# Patient Record
Sex: Male | Born: 1975 | Race: White | Hispanic: No | Marital: Married | Smoking: Current every day smoker
Health system: Southern US, Community
[De-identification: ages and names within clinical notes are randomized; demographics above are authoritative.]

## PROBLEM LIST (undated history)

## (undated) DIAGNOSIS — I10 Essential (primary) hypertension: Secondary | ICD-10-CM

## (undated) DIAGNOSIS — M1990 Unspecified osteoarthritis, unspecified site: Secondary | ICD-10-CM

## (undated) DIAGNOSIS — E7800 Pure hypercholesterolemia, unspecified: Secondary | ICD-10-CM

## (undated) DIAGNOSIS — M179 Osteoarthritis of knee, unspecified: Secondary | ICD-10-CM

## (undated) DIAGNOSIS — I2089 Other forms of angina pectoris: Secondary | ICD-10-CM

## (undated) DIAGNOSIS — E785 Hyperlipidemia, unspecified: Secondary | ICD-10-CM

## (undated) DIAGNOSIS — E875 Hyperkalemia: Secondary | ICD-10-CM

## (undated) DIAGNOSIS — R0600 Dyspnea, unspecified: Secondary | ICD-10-CM

## (undated) DIAGNOSIS — I208 Other forms of angina pectoris: Secondary | ICD-10-CM

## (undated) DIAGNOSIS — G43909 Migraine, unspecified, not intractable, without status migrainosus: Secondary | ICD-10-CM

## (undated) DIAGNOSIS — I219 Acute myocardial infarction, unspecified: Secondary | ICD-10-CM

## (undated) DIAGNOSIS — K219 Gastro-esophageal reflux disease without esophagitis: Secondary | ICD-10-CM

## (undated) HISTORY — PX: APPENDECTOMY: SHX54

## (undated) HISTORY — PX: KNEE SURGERY: SHX244

## (undated) HISTORY — PX: CHOLECYSTECTOMY: SHX55

## (undated) HISTORY — PX: JOINT REPLACEMENT: SHX530

## (undated) HISTORY — PX: COLONOSCOPY: SHX174

## (undated) HISTORY — PX: ESOPHAGOGASTRODUODENOSCOPY: SHX1529

## (undated) HISTORY — PX: CARDIAC CATHETERIZATION: SHX172

---

## 2005-09-15 ENCOUNTER — Emergency Department: Payer: Self-pay | Admitting: General Practice

## 2005-12-26 ENCOUNTER — Emergency Department: Payer: Self-pay | Admitting: Emergency Medicine

## 2005-12-28 ENCOUNTER — Ambulatory Visit: Payer: Self-pay | Admitting: Internal Medicine

## 2006-08-28 ENCOUNTER — Emergency Department: Payer: Self-pay | Admitting: Emergency Medicine

## 2006-12-28 ENCOUNTER — Emergency Department: Payer: Self-pay | Admitting: General Practice

## 2007-04-13 ENCOUNTER — Emergency Department: Payer: Self-pay | Admitting: Emergency Medicine

## 2007-05-14 ENCOUNTER — Emergency Department: Payer: Self-pay | Admitting: Emergency Medicine

## 2007-05-15 ENCOUNTER — Emergency Department: Payer: Self-pay | Admitting: Emergency Medicine

## 2007-05-15 ENCOUNTER — Ambulatory Visit: Payer: Self-pay | Admitting: Emergency Medicine

## 2007-05-18 ENCOUNTER — Ambulatory Visit: Payer: Self-pay | Admitting: Gastroenterology

## 2007-05-22 ENCOUNTER — Ambulatory Visit: Payer: Self-pay | Admitting: General Surgery

## 2008-08-26 ENCOUNTER — Emergency Department: Payer: Self-pay | Admitting: Emergency Medicine

## 2008-12-13 ENCOUNTER — Emergency Department: Payer: Self-pay | Admitting: Emergency Medicine

## 2008-12-20 ENCOUNTER — Emergency Department: Payer: Self-pay | Admitting: Emergency Medicine

## 2009-01-05 ENCOUNTER — Emergency Department: Payer: Self-pay | Admitting: Emergency Medicine

## 2009-02-11 ENCOUNTER — Ambulatory Visit: Payer: Self-pay | Admitting: Orthopedic Surgery

## 2009-03-04 ENCOUNTER — Ambulatory Visit: Payer: Self-pay | Admitting: Pain Medicine

## 2009-03-17 ENCOUNTER — Ambulatory Visit: Payer: Self-pay | Admitting: Pain Medicine

## 2009-04-05 ENCOUNTER — Emergency Department: Payer: Self-pay | Admitting: Emergency Medicine

## 2009-06-01 ENCOUNTER — Emergency Department: Payer: Self-pay | Admitting: Unknown Physician Specialty

## 2010-05-14 ENCOUNTER — Emergency Department: Payer: Self-pay | Admitting: Emergency Medicine

## 2010-05-26 ENCOUNTER — Ambulatory Visit: Payer: Self-pay | Admitting: Internal Medicine

## 2010-06-04 ENCOUNTER — Emergency Department: Payer: Self-pay | Admitting: Emergency Medicine

## 2010-08-10 DEATH — deceased

## 2010-11-11 ENCOUNTER — Emergency Department: Payer: Self-pay | Admitting: Emergency Medicine

## 2011-03-12 ENCOUNTER — Emergency Department: Payer: Self-pay | Admitting: Emergency Medicine

## 2011-05-06 ENCOUNTER — Emergency Department: Payer: Self-pay | Admitting: Internal Medicine

## 2011-11-27 ENCOUNTER — Emergency Department: Payer: Self-pay | Admitting: Emergency Medicine

## 2011-11-27 LAB — COMPREHENSIVE METABOLIC PANEL
Albumin: 4 g/dL (ref 3.4–5.0)
Alkaline Phosphatase: 72 U/L (ref 50–136)
Anion Gap: 8 (ref 7–16)
Bilirubin,Total: 0.4 mg/dL (ref 0.2–1.0)
Calcium, Total: 9.2 mg/dL (ref 8.5–10.1)
Co2: 27 mmol/L (ref 21–32)
Creatinine: 1.25 mg/dL (ref 0.60–1.30)
EGFR (African American): >60
EGFR (Non-African Amer.): >60
Glucose: 115 mg/dL — ABNORMAL HIGH (ref 65–99)
Osmolality: 283 (ref 275–301)
Potassium: 4 mmol/L (ref 3.5–5.1)
Sodium: 140 mmol/L (ref 136–145)

## 2011-11-27 LAB — CBC
HCT: 48.6 % (ref 40.0–52.0)
MCHC: 33.7 g/dL (ref 32.0–36.0)
MCV: 89 fL (ref 80–100)
RDW: 13.6 % (ref 11.5–14.5)
WBC: 13.1 10*3/uL — ABNORMAL HIGH (ref 3.8–10.6)

## 2011-11-27 LAB — TROPONIN I
Troponin-I: <0.02 ng/mL
Troponin-I: <0.02 ng/mL

## 2013-07-05 ENCOUNTER — Ambulatory Visit: Payer: Self-pay | Admitting: Emergency Medicine

## 2014-11-11 ENCOUNTER — Emergency Department: Payer: Self-pay | Admitting: Emergency Medicine

## 2014-11-11 LAB — BASIC METABOLIC PANEL
ANION GAP: 6 — AB (ref 7–16)
BUN: 15 mg/dL (ref 7–18)
Calcium, Total: 8.7 mg/dL (ref 8.5–10.1)
Chloride: 106 mmol/L (ref 98–107)
Co2: 28 mmol/L (ref 21–32)
Creatinine: 1.31 mg/dL — ABNORMAL HIGH (ref 0.60–1.30)
EGFR (African American): >60
EGFR (Non-African Amer.): >60
Glucose: 102 mg/dL — ABNORMAL HIGH (ref 65–99)
Osmolality: 280 (ref 275–301)
POTASSIUM: 3.7 mmol/L (ref 3.5–5.1)
Sodium: 140 mmol/L (ref 136–145)

## 2014-11-11 LAB — CBC
HCT: 45 % (ref 40.0–52.0)
HGB: 15 g/dL (ref 13.0–18.0)
MCH: 29.1 pg (ref 26.0–34.0)
MCHC: 33.4 g/dL (ref 32.0–36.0)
MCV: 87 fL (ref 80–100)
Platelet: 183 10*3/uL (ref 150–440)
RBC: 5.17 10*6/uL (ref 4.40–5.90)
RDW: 14 % (ref 11.5–14.5)
WBC: 6.9 10*3/uL (ref 3.8–10.6)

## 2014-11-11 LAB — TROPONIN I: Troponin-I: <0.02 ng/mL

## 2014-11-12 LAB — TROPONIN I: Troponin-I: <0.02 ng/mL

## 2014-11-25 ENCOUNTER — Emergency Department: Payer: Self-pay | Admitting: Student

## 2014-12-18 DIAGNOSIS — I38 Endocarditis, valve unspecified: Secondary | ICD-10-CM

## 2014-12-18 HISTORY — DX: Endocarditis, valve unspecified: I38

## 2015-06-06 ENCOUNTER — Emergency Department: Payer: Medicaid Other

## 2015-06-06 ENCOUNTER — Emergency Department
Admission: EM | Admit: 2015-06-06 | Discharge: 2015-06-06 | Disposition: A | Discharge status: Home / Self Care | Payer: Medicaid Other | Attending: Emergency Medicine | Admitting: Emergency Medicine

## 2015-06-06 ENCOUNTER — Encounter: Payer: Self-pay | Admitting: Emergency Medicine

## 2015-06-06 DIAGNOSIS — R109 Unspecified abdominal pain: Secondary | ICD-10-CM | POA: Diagnosis not present

## 2015-06-06 DIAGNOSIS — Z720 Tobacco use: Secondary | ICD-10-CM | POA: Diagnosis not present

## 2015-06-06 DIAGNOSIS — R110 Nausea: Secondary | ICD-10-CM | POA: Insufficient documentation

## 2015-06-06 DIAGNOSIS — R34 Anuria and oliguria: Secondary | ICD-10-CM | POA: Diagnosis present

## 2015-06-06 HISTORY — DX: Pure hypercholesterolemia, unspecified: E78.00

## 2015-06-06 LAB — CBC WITH DIFFERENTIAL/PLATELET
Basophils Absolute: 0.1 10*3/uL (ref 0–0.1)
Basophils Relative: 1 %
EOS ABS: 0.3 10*3/uL (ref 0–0.7)
EOS PCT: 3 %
HCT: 46.7 % (ref 40.0–52.0)
Hemoglobin: 15.9 g/dL (ref 13.0–18.0)
Lymphocytes Relative: 43 %
Lymphs Abs: 3.6 10*3/uL (ref 1.0–3.6)
MCH: 29.6 pg (ref 26.0–34.0)
MCHC: 34.2 g/dL (ref 32.0–36.0)
MCV: 86.7 fL (ref 80.0–100.0)
MONO ABS: 0.5 10*3/uL (ref 0.2–1.0)
MONOS PCT: 6 %
Neutro Abs: 4 10*3/uL (ref 1.4–6.5)
Neutrophils Relative %: 47 %
PLATELETS: 203 10*3/uL (ref 150–440)
RBC: 5.39 MIL/uL (ref 4.40–5.90)
RDW: 14.4 % (ref 11.5–14.5)
WBC: 8.5 10*3/uL (ref 3.8–10.6)

## 2015-06-06 LAB — URINALYSIS COMPLETE WITH MICROSCOPIC (ARMC ONLY)
BILIRUBIN URINE: NEGATIVE
Bacteria, UA: NONE SEEN
Glucose, UA: NEGATIVE mg/dL
HGB URINE DIPSTICK: NEGATIVE
LEUKOCYTES UA: NEGATIVE
Nitrite: NEGATIVE
Protein, ur: NEGATIVE mg/dL
SQUAMOUS EPITHELIAL / LPF: NONE SEEN
Specific Gravity, Urine: 1.033 — ABNORMAL HIGH (ref 1.005–1.030)
pH: 5 (ref 5.0–8.0)

## 2015-06-06 LAB — COMPREHENSIVE METABOLIC PANEL
ALT: 19 U/L (ref 17–63)
ANION GAP: 6 (ref 5–15)
AST: 22 U/L (ref 15–41)
Albumin: 3.9 g/dL (ref 3.5–5.0)
Alkaline Phosphatase: 75 U/L (ref 38–126)
BUN: 13 mg/dL (ref 6–20)
CALCIUM: 8.9 mg/dL (ref 8.9–10.3)
CHLORIDE: 108 mmol/L (ref 101–111)
CO2: 28 mmol/L (ref 22–32)
Creatinine, Ser: 1.31 mg/dL — ABNORMAL HIGH (ref 0.61–1.24)
GFR calc non Af Amer: >60 mL/min (ref >60)
Glucose, Bld: 93 mg/dL (ref 65–99)
Potassium: 4.3 mmol/L (ref 3.5–5.1)
SODIUM: 142 mmol/L (ref 135–145)
Total Bilirubin: 0.5 mg/dL (ref 0.3–1.2)
Total Protein: 7.1 g/dL (ref 6.5–8.1)

## 2015-06-06 MED ORDER — DIAZEPAM 5 MG PO TABS: 5.0000 mg | ORAL_TABLET | Freq: Three times a day (TID) | ORAL | Status: DC | PRN

## 2015-06-06 MED ORDER — KETOROLAC TROMETHAMINE 30 MG/ML IJ SOLN
30.0000 mg | Freq: Once | INTRAMUSCULAR | Status: AC
  Administered 2015-06-06: 30 mg via INTRAVENOUS
  Filled 2015-06-06: qty 1

## 2015-06-06 MED ORDER — IBUPROFEN 800 MG PO TABS: 800.0000 mg | ORAL_TABLET | Freq: Three times a day (TID) | ORAL | Status: DC | PRN

## 2015-06-06 NOTE — ED Provider Notes (Signed)
[facility] Emergency Department Provider Note     Time seen: ----------------------------------------- 7:53 PM on 06/06/2015 -----------------------------------------    I have reviewed the triage vital signs and the nursing notes.   HISTORY  Chief Complaint Flank Pain    HPI Alex Brandt is a 39 y.o. male who presents ER with left flank pain that started suddenly around 3 PM. Patient had decreased urine output since that period time. Has never had a kidney stone before, pain is sharp and he had some nausea. Nothing seems to make it better or worse.   Past Medical History  Diagnosis Date  . High cholesterol     There are no active problems to display for this patient.   Past Surgical History  Procedure Laterality Date  . Cholecystectomy    . Appendectomy      Allergies Review of patient's allergies indicates no known allergies.  Social History Social History  Substance Use Topics  . Smoking status: Current Every Day Smoker -- 0.50 packs/day    Types: Cigarettes  . Smokeless tobacco: None  . Alcohol Use: Yes    Review of Systems Constitutional: Negative for fever. Eyes: Negative for visual changes. ENT: Negative for sore throat. Cardiovascular: Negative for chest pain. Respiratory: Negative for shortness of breath. Gastrointestinal: Positive for left flank pain and nausea Genitourinary: Negative for dysuria. Musculoskeletal: Negative for back pain. Skin: Negative for rash. Neurological: Negative for headaches, focal weakness or numbness.  10-point ROS otherwise negative.  ____________________________________________   PHYSICAL EXAM:  VITAL SIGNS: ED Triage Vitals  Enc Vitals Group     BP 06/06/15 1844 96/54 mmHg     Pulse Rate 06/06/15 1844 96     Resp 06/06/15 1844 16     Temp 06/06/15 1844 98.1 F (36.7 C)     Temp Source 06/06/15 1844 Oral     SpO2 06/06/15 1844 97 %     Weight 06/06/15 1844 220 lb (99.791  kg)     Height 06/06/15 1844  (1.93 m)     Head Cir --      Peak Flow --      Pain Score 06/06/15 1845 8     Pain Loc --      Pain Edu? --      Excl. in GC? --     Constitutional: Alert and oriented. Well appearing and in no distress. Eyes: Conjunctivae are normal. PERRL. Normal extraocular movements. ENT   Head: Normocephalic and atraumatic.   Nose: No congestion/rhinnorhea.   Mouth/Throat: Mucous membranes are moist.   Neck: No stridor. Cardiovascular: Normal rate, regular rhythm. Normal and symmetric distal pulses are present in all extremities. No murmurs, rubs, or gallops. Respiratory: Normal respiratory effort without tachypnea nor retractions. Breath sounds are clear and equal bilaterally. No wheezes/rales/rhonchi. Gastrointestinal: Soft and nontender. No distention. No abdominal bruits. Left flank tenderness Musculoskeletal: Nontender with normal range of motion in all extremities. No joint effusions.  No lower extremity tenderness nor edema. Neurologic:  Normal speech and language. No gross focal neurologic deficits are appreciated. Speech is normal. No gait instability. Skin:  Skin is warm, dry and intact. No rash noted. Psychiatric: Mood and affect are normal. Speech and behavior are normal. Patient exhibits appropriate insight and judgment.  ____________________________________________  ED COURSE:  Pertinent labs & imaging results that were available during my care of the patient were reviewed by me and considered in my medical decision making (see chart for details). Patient is in no  acute distress, likely renal colic. ____________________________________________    LABS (pertinent positives/negatives)  Labs Reviewed  URINALYSIS COMPLETEWITH MICROSCOPIC ([facility] ONLY) - Abnormal; Notable for the following:    Color, Urine YELLOW (*)    APPearance CLEAR (*)    Ketones, ur TRACE (*)    Specific Gravity, Urine 1.033 (*)    All other components within  normal limits  COMPREHENSIVE METABOLIC PANEL - Abnormal; Notable for the following:    Creatinine, Ser 1.31 (*)    All other components within normal limits  CBC WITH DIFFERENTIAL/PLATELET    RADIOLOGY Images were viewed by me  CT renal protocol IMPRESSION: No acute findings. No renal or ureteral stones. No hydronephrosis. ____________________________________________  FINAL ASSESSMENT AND PLAN  Flank pain  Plan: Patient with labs and imaging as dictated above. No clear etiology for flank pain. Likely muscle strain. Patient be discharged with Motrin and Valium to take as needed. He is encouraged to have close follow-up with his doctor.   Emily Filbert, MD   Emily Filbert, MD 06/06/15 2126

## 2015-06-06 NOTE — Discharge Instructions (Signed)
Flank Pain °Flank pain refers to pain that is located on the side of the body between the upper abdomen and the back. The pain may occur over a short period of time (acute) or may be long-term or reoccurring (chronic). It may be mild or severe. Flank pain can be caused by many things. °CAUSES  °Some of the more common causes of flank pain include: °· Muscle strains.   °· Muscle spasms.   °· A disease of your spine (vertebral disk disease).   °· A lung infection (pneumonia).   °· Fluid around your lungs (pulmonary edema).   °· A kidney infection.   °· Kidney stones.   °· A very painful skin rash caused by the chickenpox virus (shingles).   °· Gallbladder disease.   °HOME CARE INSTRUCTIONS  °Home care will depend on the cause of your pain. In general, °· Rest as directed by your caregiver. °· Drink enough fluids to keep your urine clear or pale yellow. °· Only take over-the-counter or prescription medicines as directed by your caregiver. Some medicines may help relieve the pain. °· Tell your caregiver about any changes in your pain. °· Follow up with your caregiver as directed. °SEEK IMMEDIATE MEDICAL CARE IF:  °· Your pain is not controlled with medicine.   °· You have new or worsening symptoms. °· Your pain increases.   °· You have abdominal pain.   °· You have shortness of breath.   °· You have persistent nausea or vomiting.   °· You have swelling in your abdomen.   °· You feel faint or pass out.   °· You have blood in your urine. °· You have a fever or persistent symptoms for more than 2-3 days. °· You have a fever and your symptoms suddenly get worse. °MAKE SURE YOU:  °· Understand these instructions. °· Will watch your condition. °· Will get help right away if you are not doing well or get worse. °Document Released: 11/17/2005 Document Revised: 06/20/2012 Document Reviewed: 05/10/2012 °[employer]® Patient Information ©2015 [employer]. This information is not intended to replace advice given to you by your  health care provider. Make sure you discuss any questions you have with your health care provider. ° °

## 2015-06-06 NOTE — ED Notes (Signed)
Patient presents to the ED with left flank pain that started at 3pm.  Patient states he has had less frequent urination than normal as well.  Patient denies history of kidney stones.  Patient appears uncomfortable in triage.  Moving around in chair.

## 2016-06-11 IMAGING — CR DG CHEST 2V
1 series · 2 of 2 positions shown · non-contrast
Comparison: Prior radiograph from 03/26/2012

CLINICAL DATA: Initial evaluation for acute chest pain.

EXAM:
CHEST  2 VIEW

[Series 1: dxr chest pa (or ap) and lateral · 0.14mm/px · 2 of 2 slices shown]
[im 1/2]
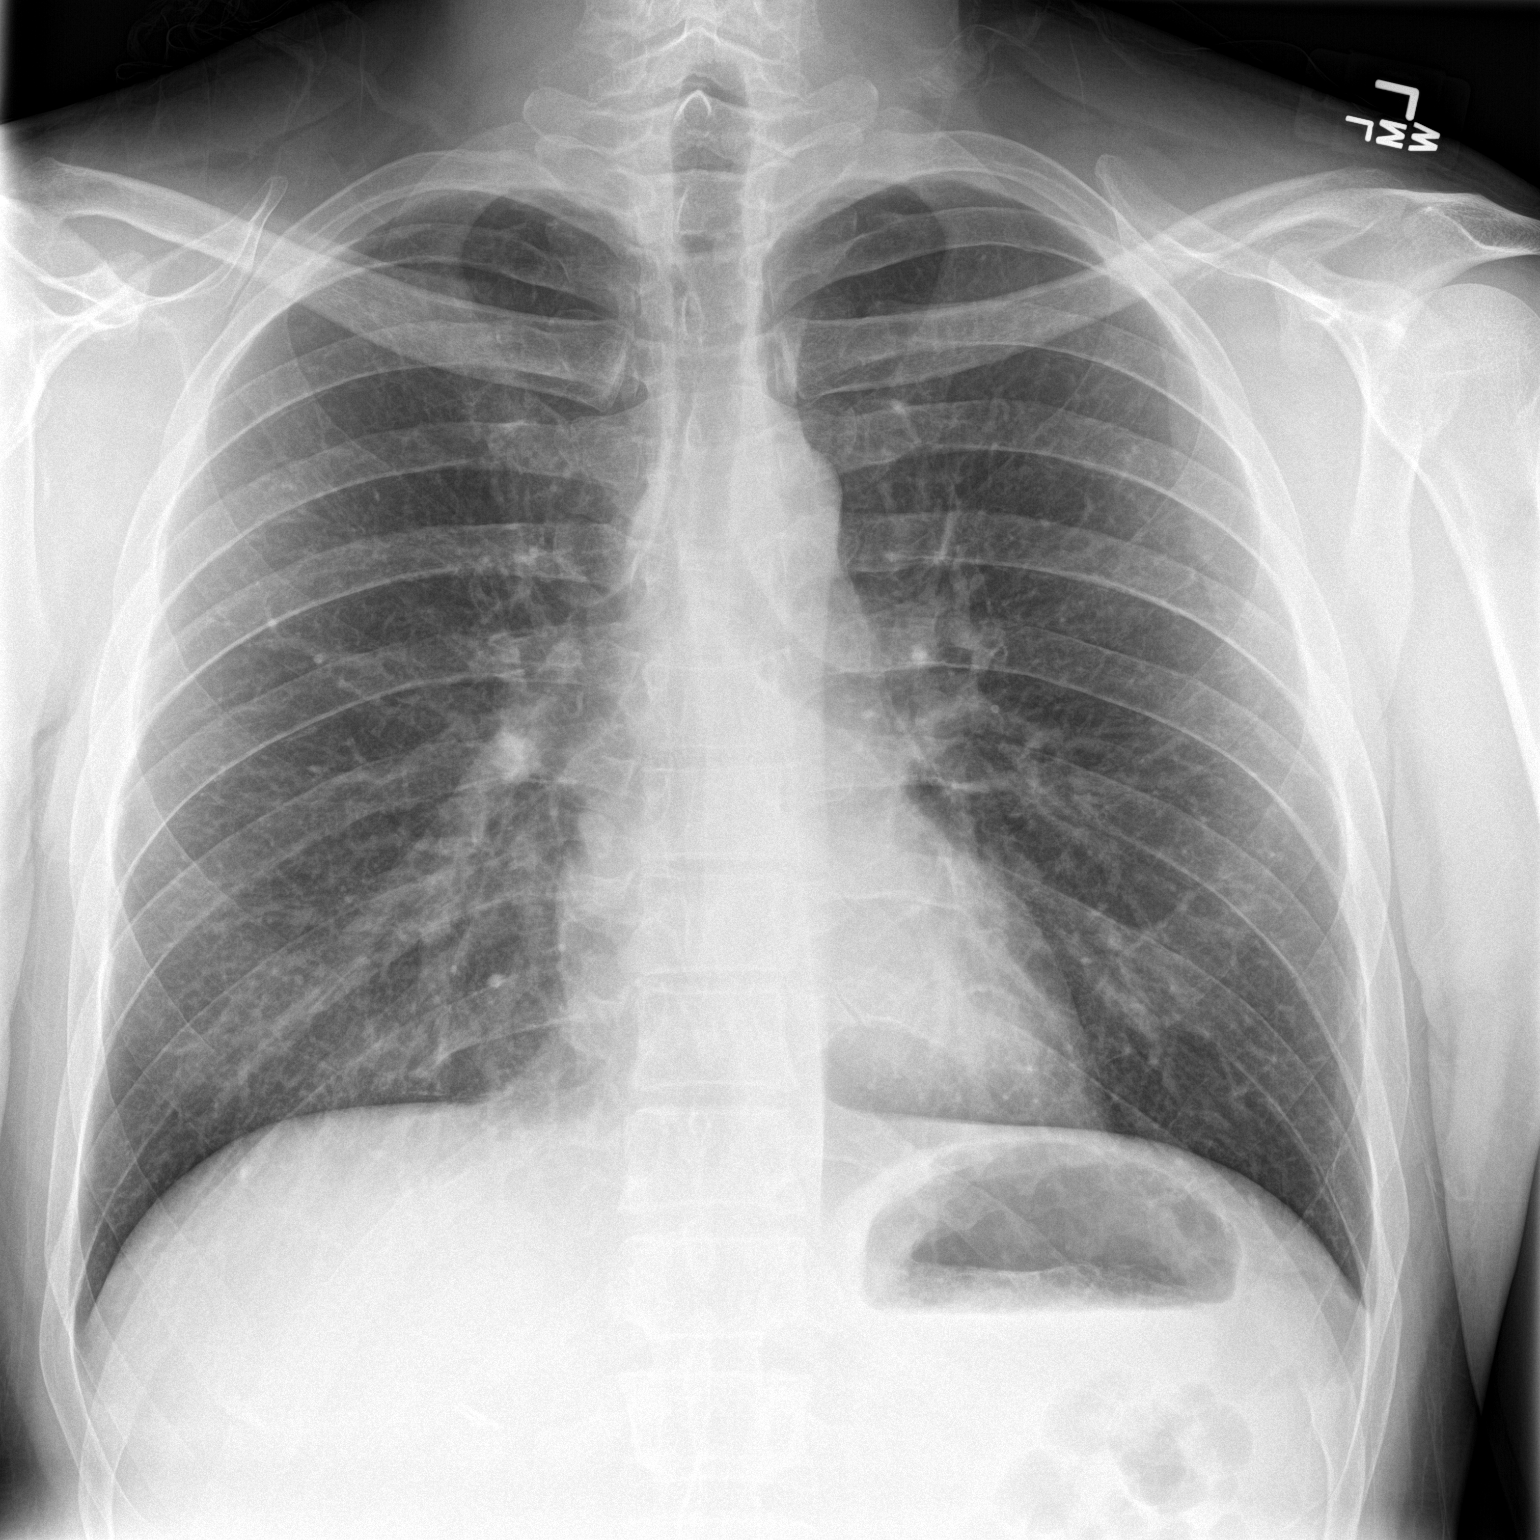
[im 2/2]
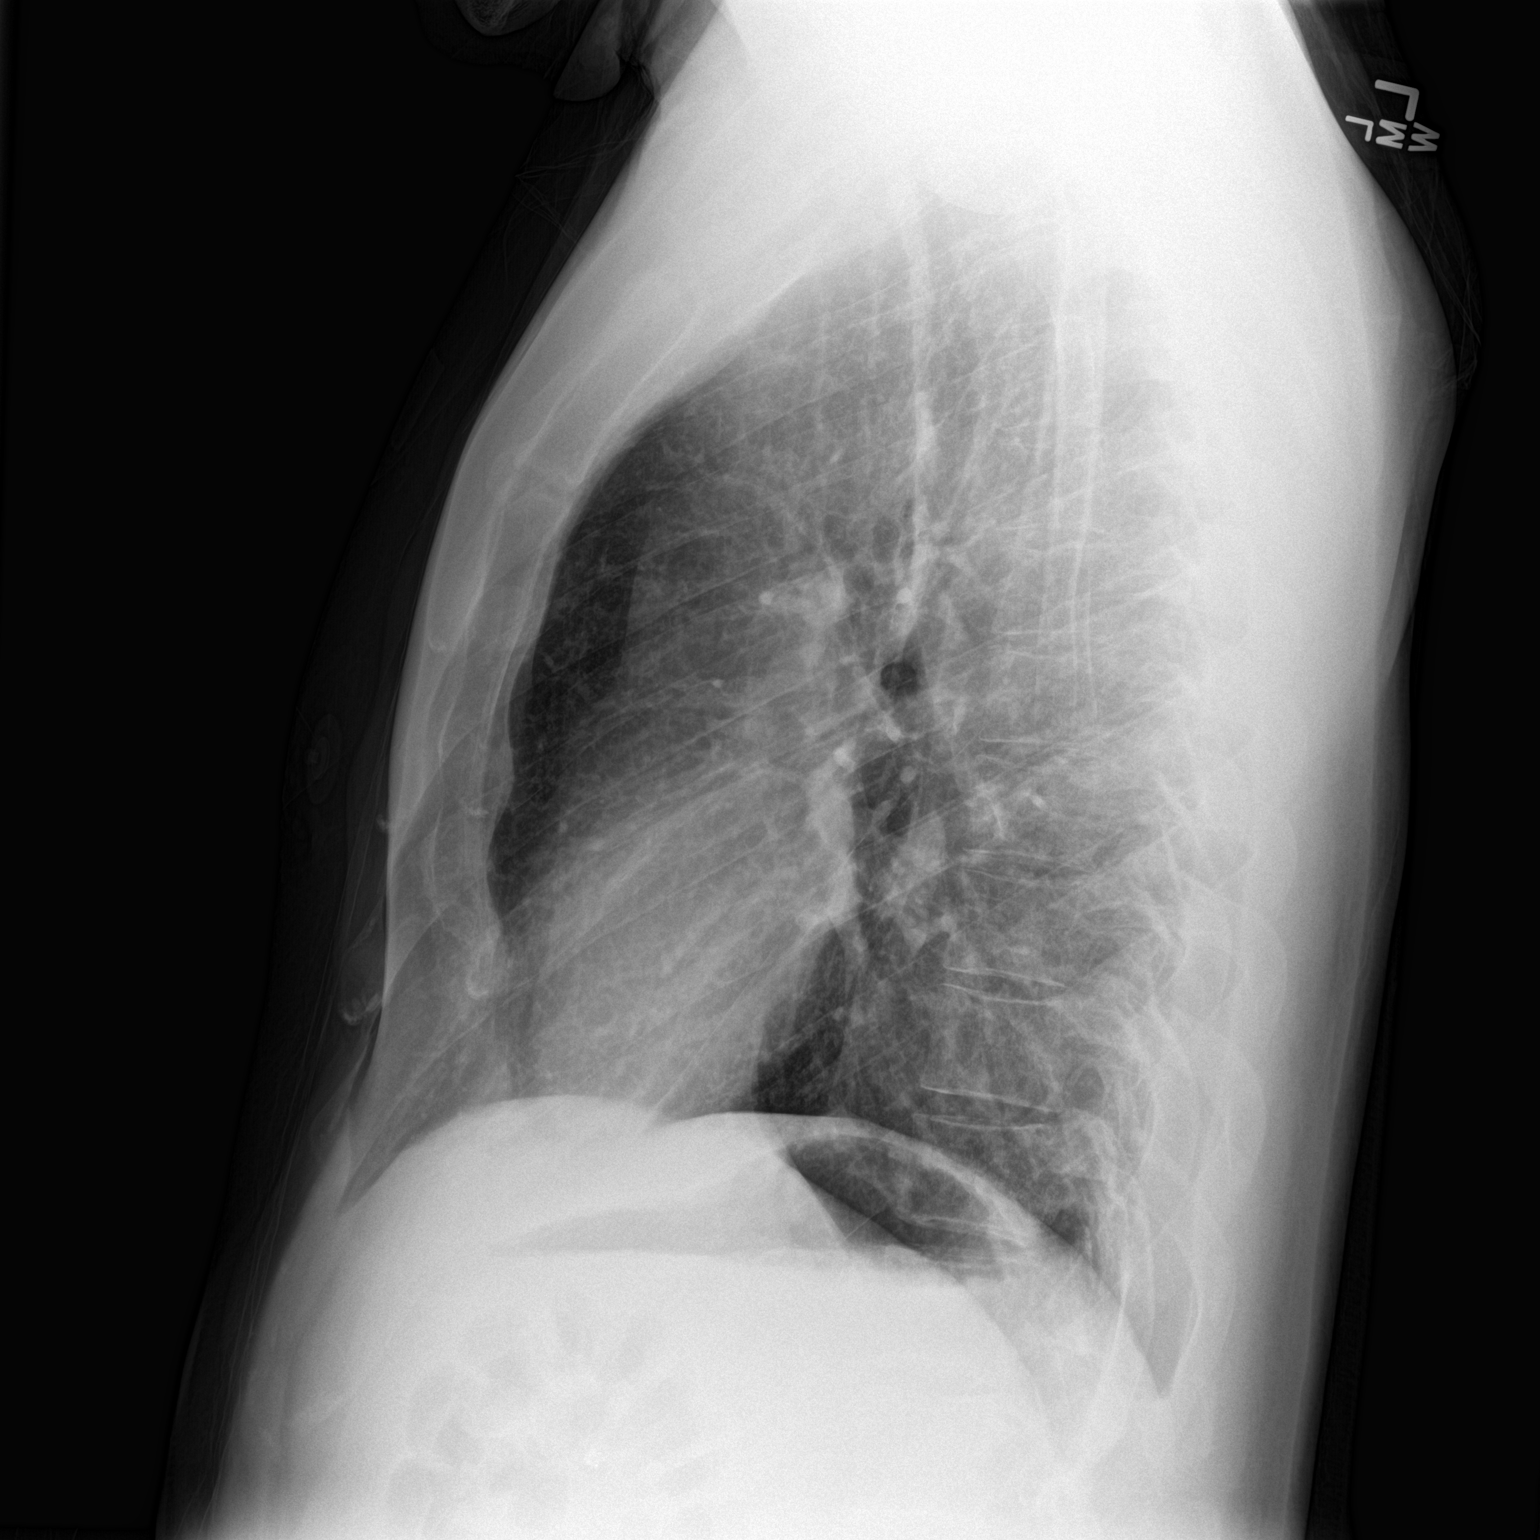

[2 of 2 positions shown; findings below may reference images not displayed]

FINDINGS: The cardiac and mediastinal silhouettes are stable in size and
contour, and remain within normal limits.

The lungs are normally inflated. No airspace consolidation, pleural
effusion, or pulmonary edema is identified. There is no
pneumothorax.

No acute osseous abnormality identified.
IMPRESSION: No active cardiopulmonary disease.

## 2017-04-01 ENCOUNTER — Emergency Department
Admission: EM | Admit: 2017-04-01 | Discharge: 2017-04-01 | Disposition: A | Discharge status: Home / Self Care | Payer: Medicaid Other | Attending: Emergency Medicine | Admitting: Emergency Medicine

## 2017-04-01 ENCOUNTER — Emergency Department: Payer: Medicaid Other

## 2017-04-01 DIAGNOSIS — R079 Chest pain, unspecified: Secondary | ICD-10-CM | POA: Diagnosis not present

## 2017-04-01 DIAGNOSIS — R55 Syncope and collapse: Secondary | ICD-10-CM

## 2017-04-01 DIAGNOSIS — Z79899 Other long term (current) drug therapy: Secondary | ICD-10-CM | POA: Diagnosis not present

## 2017-04-01 DIAGNOSIS — E860 Dehydration: Secondary | ICD-10-CM | POA: Diagnosis not present

## 2017-04-01 DIAGNOSIS — F17210 Nicotine dependence, cigarettes, uncomplicated: Secondary | ICD-10-CM | POA: Diagnosis not present

## 2017-04-01 DIAGNOSIS — I10 Essential (primary) hypertension: Secondary | ICD-10-CM | POA: Insufficient documentation

## 2017-04-01 HISTORY — DX: Essential (primary) hypertension: I10

## 2017-04-01 LAB — CBC WITH DIFFERENTIAL/PLATELET
BASOS ABS: 0.1 10*3/uL (ref 0–0.1)
BASOS PCT: 1 %
EOS ABS: 0.1 10*3/uL (ref 0–0.7)
EOS PCT: 1 %
HCT: 47 % (ref 40.0–52.0)
Hemoglobin: 16.4 g/dL (ref 13.0–18.0)
Lymphocytes Relative: 30 %
Lymphs Abs: 2.5 10*3/uL (ref 1.0–3.6)
MCH: 30.2 pg (ref 26.0–34.0)
MCHC: 35 g/dL (ref 32.0–36.0)
MCV: 86.2 fL (ref 80.0–100.0)
MONO ABS: 0.5 10*3/uL (ref 0.2–1.0)
Monocytes Relative: 6 %
Neutro Abs: 5.2 10*3/uL (ref 1.4–6.5)
Neutrophils Relative %: 62 %
PLATELETS: 205 10*3/uL (ref 150–440)
RBC: 5.45 MIL/uL (ref 4.40–5.90)
RDW: 14.2 % (ref 11.5–14.5)
WBC: 8.4 10*3/uL (ref 3.8–10.6)

## 2017-04-01 LAB — COMPREHENSIVE METABOLIC PANEL
ALBUMIN: 4.5 g/dL (ref 3.5–5.0)
ALT: 17 U/L (ref 17–63)
AST: 22 U/L (ref 15–41)
Alkaline Phosphatase: 81 U/L (ref 38–126)
Anion gap: 8 (ref 5–15)
BUN: 17 mg/dL (ref 6–20)
CALCIUM: 9.1 mg/dL (ref 8.9–10.3)
CO2: 27 mmol/L (ref 22–32)
Chloride: 103 mmol/L (ref 101–111)
Creatinine, Ser: 1.19 mg/dL (ref 0.61–1.24)
GFR calc Af Amer: >60 mL/min (ref >60)
Glucose, Bld: 97 mg/dL (ref 65–99)
POTASSIUM: 3.8 mmol/L (ref 3.5–5.1)
Sodium: 138 mmol/L (ref 135–145)
TOTAL PROTEIN: 7.9 g/dL (ref 6.5–8.1)
Total Bilirubin: 0.7 mg/dL (ref 0.3–1.2)

## 2017-04-01 LAB — URINE DRUG SCREEN, QUALITATIVE (ARMC ONLY)
AMPHETAMINES, UR SCREEN: NOT DETECTED
BENZODIAZEPINE, UR SCRN: NOT DETECTED
Barbiturates, Ur Screen: NOT DETECTED
Cannabinoid 50 Ng, Ur ~~LOC~~: NOT DETECTED
Cocaine Metabolite,Ur ~~LOC~~: NOT DETECTED
MDMA (Ecstasy)Ur Screen: NOT DETECTED
METHADONE SCREEN, URINE: NOT DETECTED
Opiate, Ur Screen: NOT DETECTED
PHENCYCLIDINE (PCP) UR S: NOT DETECTED
TRICYCLIC, UR SCREEN: NOT DETECTED

## 2017-04-01 LAB — TROPONIN I

## 2017-04-01 LAB — LIPASE, BLOOD: LIPASE: 28 U/L (ref 11–51)

## 2017-04-01 LAB — SALICYLATE LEVEL

## 2017-04-01 LAB — CK: CK TOTAL: 118 U/L (ref 49–397)

## 2017-04-01 LAB — ACETAMINOPHEN LEVEL

## 2017-04-01 LAB — ETHANOL: Alcohol, Ethyl (B): 5 mg/dL — ABNORMAL HIGH (ref <5)

## 2017-04-01 MED ORDER — IOPAMIDOL (ISOVUE-370) INJECTION 76%
75.0000 mL | Freq: Once | INTRAVENOUS | Status: AC | PRN
  Administered 2017-04-01: 75 mL via INTRAVENOUS

## 2017-04-01 MED ORDER — SODIUM CHLORIDE 0.9 % IV BOLUS (SEPSIS)
1000.0000 mL | Freq: Once | INTRAVENOUS | Status: AC
  Administered 2017-04-01: 1000 mL via INTRAVENOUS

## 2017-04-01 NOTE — ED Triage Notes (Signed)
Pt comes via [facility] for c/o heat exposure and central chest pain that radiates to left arm and back. Per EMS pt was outside mowing and was found by family in bathroom unconscious. Pt A&OX4, VS stable per EMS with temp of 98.5. EKG unremarkable. BS 100

## 2017-04-01 NOTE — ED Notes (Signed)
Pt answering questions without difficulty at this time.

## 2017-04-01 NOTE — ED Provider Notes (Signed)
[facility] Emergency Department Provider Note  ____________________________________________  Time seen: Approximately 7:08 PM  I have reviewed the triage vital signs and the nursing notes.   HISTORY  Chief Complaint Heat Exposure and Chest Pain    HPI Alex Brandt is a 41 y.o. male comes to the ED due to passing out in his bathroom at home. Family reported to EMS that the patient has been outside mowing in the hot weather and then came inside he is a bathroom where he then lost consciousness. Patient denies any complaints prior to passing out. No chest pain back pain abdominal pain shortness of breath or headache. When he woke up he's been having central chest pain described as tingling, radiates to the back and left arm. Constant since then. No aggravating or alleviating factors. Not short of breath. No vomiting or diaphoresis.  Oddly, the patient initially acts like he can't talk but he communicates normally otherwise including mouthing words and responding to questions appropriately and writing. Toward the end of interview and exam the patient did begin speaking normally.     Past Medical History:  Diagnosis Date  . High cholesterol   . Hypertension      There are no active problems to display for this patient.    Past Surgical History:  Procedure Laterality Date  . APPENDECTOMY    . CHOLECYSTECTOMY       Prior to Admission medications   Medication Sig Start Date End Date Taking? Authorizing Provider  meclizine (ANTIVERT) 25 MG tablet Take 25 mg by mouth 3 (three) times daily as needed for dizziness.   Yes [provider]  diazepam (VALIUM) 5 MG tablet Take 1 tablet (5 mg total) by mouth every 8 (eight) hours as needed for muscle spasms. Patient not taking: Reported on 04/01/2017 06/06/15   Emily Filbert, MD  ibuprofen (ADVIL,MOTRIN) 800 MG tablet Take 1 tablet (800 mg total) by mouth every 8 (eight) hours as  needed. Patient not taking: Reported on 04/01/2017 06/06/15   Emily Filbert, MD  pravastatin (PRAVACHOL) 40 MG tablet Take 40 mg by mouth daily.    [provider]     Allergies Shellfish allergy   No family history on file.  Social History Social History  Substance Use Topics  . Smoking status: Current Every Day Smoker    Packs/day: 0.50    Types: Cigarettes  . Smokeless tobacco: Never Used  . Alcohol use Yes    Review of Systems  Constitutional:   No fever or chills.  ENT:   No sore throat. No rhinorrhea. Cardiovascular:   Positive chest pain as above after syncope. Respiratory:   No dyspnea or cough. Gastrointestinal:   Negative for abdominal pain, vomiting and diarrhea.  Musculoskeletal:   Negative for focal pain or swelling All other systems reviewed and are negative except as documented above in ROS and HPI.  ____________________________________________   PHYSICAL EXAM:  VITAL SIGNS: ED Triage Vitals  Enc Vitals Group     BP 04/01/17 1739 124/77     Pulse Rate 04/01/17 1739 81     Resp 04/01/17 1739 18     Temp 04/01/17 1739 97.9 F (36.6 C)     Temp Source 04/01/17 1739 Oral     SpO2 04/01/17 1739 99 %     Weight 04/01/17 1740 210 lb (95.3 kg)     Height 04/01/17 1740 6\' 5"  (1.956 m)     Head Circumference --  Peak Flow --      Pain Score 04/01/17 1739 10     Pain Loc --      Pain Edu? --      Excl. in GC? --     Vital signs reviewed, nursing assessments reviewed.   Constitutional:   Alert and oriented. Well appearing and in no distress. Eyes:   No scleral icterus.  EOMI. No nystagmus. No conjunctival pallor. PERRL. ENT   Head:   Normocephalic and atraumatic.   Nose:   No congestion/rhinnorhea.    Mouth/Throat:   MMM, no pharyngeal erythema. No peritonsillar mass.    Neck:   No meningismus. Full ROM Hematological/Lymphatic/Immunilogical:   No cervical lymphadenopathy. Cardiovascular:   RRR. Symmetric bilateral  radial and DP pulses.  No murmurs.  Respiratory:   Normal respiratory effort without tachypnea/retractions. Breath sounds are clear and equal bilaterally. No wheezes/rales/rhonchi.Chest wall mildly tender in the area of indicated pain. Chest wall stable. No crepitus. Gastrointestinal:   Soft and nontender. Non distended. There is no CVA tenderness.  No rebound, rigidity, or guarding. Genitourinary:   deferred Musculoskeletal:   Normal range of motion in all extremities. No joint effusions.  No lower extremity tenderness.  No edema. Neurologic:   Normal speech and language.  Motor grossly intact. No gross focal neurologic deficits are appreciated.  Skin:    Skin is warm, dry and intact. No rash noted.  No petechiae, purpura, or bullae.  ____________________________________________    LABS (pertinent positives/negatives) (all labs ordered are listed, but only abnormal results are displayed) Labs Reviewed  ETHANOL - Abnormal; Notable for the following:       Result Value   Alcohol, Ethyl (B) 5 (*)    All other components within normal limits  ACETAMINOPHEN LEVEL - Abnormal; Notable for the following:    Acetaminophen (Tylenol), Serum <10 (*)    All other components within normal limits  COMPREHENSIVE METABOLIC PANEL  LIPASE, BLOOD  TROPONIN I  CBC WITH DIFFERENTIAL/PLATELET  URINE DRUG SCREEN, QUALITATIVE ([facility] ONLY)  SALICYLATE LEVEL  CK  TROPONIN I   ____________________________________________   EKG  Interpreted by me  Date: 04/01/2017  Rate: 81  Rhythm: normal sinus rhythm  QRS Axis: normal  Intervals: normal  ST/T Wave abnormalities: normal  Conduction Disutrbances: none  Narrative Interpretation: unremarkable      ____________________________________________    RADIOLOGY  Dg Chest Portable 1 View  Result Date: 04/01/2017 CLINICAL DATA:  Central chest pain. EXAM: PORTABLE CHEST 1 VIEW COMPARISON:  November 11, 2014 FINDINGS: The heart size and mediastinal  contours are within normal limits. Both lungs are clear. The visualized skeletal structures are unremarkable. IMPRESSION: No active disease. Electronically Signed   By: Gerome Sam III M.D   On: 04/01/2017 18:21   Ct Angio Chest Aorta W And/or Wo Contrast  Result Date: 04/01/2017 CLINICAL DATA:  Heat exposure with central chest pain radiating to left arm and back. EXAM: CT ANGIOGRAPHY CHEST WITH CONTRAST TECHNIQUE: Multidetector CT imaging of the chest was performed using the standard protocol during bolus administration of intravenous contrast. Multiplanar CT image reconstructions and MIPs were obtained to evaluate the vascular anatomy. CONTRAST:  75 mL of Isovue 370 COMPARISON:  None. FINDINGS: Cardiovascular: The study was not tailored to evaluate the pulmonary arteries. There is streak artifact due to cardiac motion over the left main pulmonary artery. Within this limitation, no central emboli are identified. No definitive coronary artery calcifications on limited images. The heart size is normal. No effusion.  The thoracic aorta is normal in caliber. No dissection. No atherosclerosis. The remainder of the vasculature is normal. Mediastinum/Nodes: No enlarged mediastinal, hilar, or axillary lymph nodes. Thyroid gland, trachea, and esophagus demonstrate no significant findings. Lungs/Pleura: Mild paraseptal emphysematous changes in the apices. No pneumothorax. Central airways are normal. No suspicious nodules, masses, or focal infiltrates. Upper Abdomen: The patient is status post cholecystectomy. No other abnormalities in the upper abdomen. Musculoskeletal: No chest wall abnormality. No acute or significant osseous findings. Review of the MIP images confirms the above findings. IMPRESSION: 1. No aortic aneurysm or dissection. Evaluation for pulmonary emboli is limited as above but no central emboli are seen. 2. No other acute abnormalities identified. Emphysema (ICD10-J43.9). Electronically Signed   By:  Gerome Samavid  Williams III M.D   On: 04/01/2017 19:31    ____________________________________________   PROCEDURES Procedures  ____________________________________________   INITIAL IMPRESSION / ASSESSMENT AND PLAN / ED COURSE  Pertinent labs & imaging results that were available during my care of the patient were reviewed by me and considered in my medical decision making (see chart for details).  Patient presents with chest pain and syncope. Chest pain story is atypical.Considering the patient's symptoms, medical history, and physical examination today, I have low suspicion for ACS, PE, TAD, pneumothorax, carditis, mediastinitis, pneumonia, CHF, or sepsis.  Vital signs are normal. Most likely this is dehydration, but due to the nature presentation comminution radiating chest pain with syncope, we'll get a CT angiogram of the chest to evaluate for aortic injury. We'll perform a delta troponin.      ----------------------------------------- 9:26 PM on 04/01/2017 -----------------------------------------  CT angiogram negative. Repeat troponin negative. Patient much improved. Vital signs remain normal. We'll discharge home to follow up with his primary care.  ____________________________________________   FINAL CLINICAL IMPRESSION(S) / ED DIAGNOSES  Final diagnoses:  Nonspecific chest pain  Syncope, unspecified syncope type  Dehydration      New Prescriptions   No medications on file     Portions of this note were generated with dragon dictation software. Dictation errors may occur despite best attempts at proofreading.    Sharman CheekStafford, Jobanny Mavis, MD 04/01/17 2126

## 2017-04-01 NOTE — Discharge Instructions (Signed)
Your tests today were all reassuring and did not reveal any acute issues.   Results for orders placed or performed during the hospital encounter of 04/01/17  Comprehensive metabolic panel  Result Value Ref Range   Sodium 138 135 - 145 mmol/L   Potassium 3.8 3.5 - 5.1 mmol/L   Chloride 103 101 - 111 mmol/L   CO2 27 22 - 32 mmol/L   Glucose, Bld 97 65 - 99 mg/dL   BUN 17 6 - 20 mg/dL   Creatinine, Ser 1.611.19 0.61 - 1.24 mg/dL   Calcium 9.1 8.9 - 09.610.3 mg/dL   Total Protein 7.9 6.5 - 8.1 g/dL   Albumin 4.5 3.5 - 5.0 g/dL   AST 22 15 - 41 U/L   ALT 17 17 - 63 U/L   Alkaline Phosphatase 81 38 - 126 U/L   Total Bilirubin 0.7 0.3 - 1.2 mg/dL   GFR calc non Af Amer >60 >60 mL/min   GFR calc Af Amer >60 >60 mL/min   Anion gap 8 5 - 15  Ethanol  Result Value Ref Range   Alcohol, Ethyl (B) 5 (H) <5 mg/dL  Lipase, blood  Result Value Ref Range   Lipase 28 11 - 51 U/L  Troponin I  Result Value Ref Range   Troponin I <0.03 <0.03 ng/mL  CBC with Differential  Result Value Ref Range   WBC 8.4 3.8 - 10.6 K/uL   RBC 5.45 4.40 - 5.90 MIL/uL   Hemoglobin 16.4 13.0 - 18.0 g/dL   HCT 04.547.0 40.940.0 - 81.152.0 %   MCV 86.2 80.0 - 100.0 fL   MCH 30.2 26.0 - 34.0 pg   MCHC 35.0 32.0 - 36.0 g/dL   RDW 91.414.2 78.211.5 - 95.614.5 %   Platelets 205 150 - 440 K/uL   Neutrophils Relative % 62 %   Neutro Abs 5.2 1.4 - 6.5 K/uL   Lymphocytes Relative 30 %   Lymphs Abs 2.5 1.0 - 3.6 K/uL   Monocytes Relative 6 %   Monocytes Absolute 0.5 0.2 - 1.0 K/uL   Eosinophils Relative 1 %   Eosinophils Absolute 0.1 0 - 0.7 K/uL   Basophils Relative 1 %   Basophils Absolute 0.1 0 - 0.1 K/uL  Urine Drug Screen, Qualitative  Result Value Ref Range   Tricyclic, Ur Screen NONE DETECTED NONE DETECTED   Amphetamines, Ur Screen NONE DETECTED NONE DETECTED   MDMA (Ecstasy)Ur Screen NONE DETECTED NONE DETECTED   Cocaine Metabolite,Ur [place] NONE DETECTED NONE DETECTED   Opiate, Ur Screen NONE DETECTED NONE DETECTED   Phencyclidine (PCP)  Ur S NONE DETECTED NONE DETECTED   Cannabinoid 50 Ng, Ur [place] NONE DETECTED NONE DETECTED   Barbiturates, Ur Screen NONE DETECTED NONE DETECTED   Benzodiazepine, Ur Scrn NONE DETECTED NONE DETECTED   Methadone Scn, Ur NONE DETECTED NONE DETECTED  Acetaminophen level  Result Value Ref Range   Acetaminophen (Tylenol), Serum <10 (L) 10 - 30 ug/mL  Salicylate level  Result Value Ref Range   Salicylate Lvl <7.0 2.8 - 30.0 mg/dL  CK  Result Value Ref Range   Total CK 118 49 - 397 U/L   Dg Chest Portable 1 View  Result Date: 04/01/2017 CLINICAL DATA:  Central chest pain. EXAM: PORTABLE CHEST 1 VIEW COMPARISON:  November 11, 2014 FINDINGS: The heart size and mediastinal contours are within normal limits. Both lungs are clear. The visualized skeletal structures are unremarkable. IMPRESSION: No active disease. Electronically Signed   By: Gerome Samavid  Williams  III M.D   On: 04/01/2017 18:21   Ct Angio Chest Aorta W And/or Wo Contrast  Result Date: 04/01/2017 CLINICAL DATA:  Heat exposure with central chest pain radiating to left arm and back. EXAM: CT ANGIOGRAPHY CHEST WITH CONTRAST TECHNIQUE: Multidetector CT imaging of the chest was performed using the standard protocol during bolus administration of intravenous contrast. Multiplanar CT image reconstructions and MIPs were obtained to evaluate the vascular anatomy. CONTRAST:  75 mL of Isovue 370 COMPARISON:  None. FINDINGS: Cardiovascular: The study was not tailored to evaluate the pulmonary arteries. There is streak artifact due to cardiac motion over the left main pulmonary artery. Within this limitation, no central emboli are identified. No definitive coronary artery calcifications on limited images. The heart size is normal. No effusion. The thoracic aorta is normal in caliber. No dissection. No atherosclerosis. The remainder of the vasculature is normal. Mediastinum/Nodes: No enlarged mediastinal, hilar, or axillary lymph nodes. Thyroid gland, trachea, and  esophagus demonstrate no significant findings. Lungs/Pleura: Mild paraseptal emphysematous changes in the apices. No pneumothorax. Central airways are normal. No suspicious nodules, masses, or focal infiltrates. Upper Abdomen: The patient is status post cholecystectomy. No other abnormalities in the upper abdomen. Musculoskeletal: No chest wall abnormality. No acute or significant osseous findings. Review of the MIP images confirms the above findings. IMPRESSION: 1. No aortic aneurysm or dissection. Evaluation for pulmonary emboli is limited as above but no central emboli are seen. 2. No other acute abnormalities identified. Emphysema (ICD10-J43.9). Electronically Signed   By: Gerome Sam III M.D   On: 04/01/2017 19:31

## 2017-04-06 ENCOUNTER — Emergency Department: Payer: Medicaid Other

## 2017-04-06 ENCOUNTER — Encounter: Payer: Self-pay | Admitting: Emergency Medicine

## 2017-04-06 ENCOUNTER — Emergency Department
Admission: EM | Admit: 2017-04-06 | Discharge: 2017-04-06 | Disposition: A | Discharge status: Home / Self Care | Payer: Medicaid Other | Attending: Emergency Medicine | Admitting: Emergency Medicine

## 2017-04-06 DIAGNOSIS — Y33XXXA Other specified events, undetermined intent, initial encounter: Secondary | ICD-10-CM | POA: Insufficient documentation

## 2017-04-06 DIAGNOSIS — S060X0A Concussion without loss of consciousness, initial encounter: Secondary | ICD-10-CM | POA: Diagnosis not present

## 2017-04-06 DIAGNOSIS — Y929 Unspecified place or not applicable: Secondary | ICD-10-CM | POA: Insufficient documentation

## 2017-04-06 DIAGNOSIS — I10 Essential (primary) hypertension: Secondary | ICD-10-CM | POA: Insufficient documentation

## 2017-04-06 DIAGNOSIS — Y939 Activity, unspecified: Secondary | ICD-10-CM | POA: Diagnosis not present

## 2017-04-06 DIAGNOSIS — Z79899 Other long term (current) drug therapy: Secondary | ICD-10-CM | POA: Diagnosis not present

## 2017-04-06 DIAGNOSIS — Z885 Allergy status to narcotic agent status: Secondary | ICD-10-CM | POA: Insufficient documentation

## 2017-04-06 DIAGNOSIS — G43909 Migraine, unspecified, not intractable, without status migrainosus: Secondary | ICD-10-CM | POA: Diagnosis not present

## 2017-04-06 DIAGNOSIS — R55 Syncope and collapse: Secondary | ICD-10-CM | POA: Diagnosis present

## 2017-04-06 DIAGNOSIS — Z91013 Allergy to seafood: Secondary | ICD-10-CM | POA: Insufficient documentation

## 2017-04-06 DIAGNOSIS — Y999 Unspecified external cause status: Secondary | ICD-10-CM | POA: Diagnosis not present

## 2017-04-06 DIAGNOSIS — F17210 Nicotine dependence, cigarettes, uncomplicated: Secondary | ICD-10-CM | POA: Insufficient documentation

## 2017-04-06 LAB — BASIC METABOLIC PANEL
Anion gap: 6 (ref 5–15)
BUN: 12 mg/dL (ref 6–20)
CALCIUM: 8.9 mg/dL (ref 8.9–10.3)
CO2: 27 mmol/L (ref 22–32)
Chloride: 106 mmol/L (ref 101–111)
Creatinine, Ser: 1.36 mg/dL — ABNORMAL HIGH (ref 0.61–1.24)
GFR calc Af Amer: >60 mL/min (ref >60)
Glucose, Bld: 121 mg/dL — ABNORMAL HIGH (ref 65–99)
POTASSIUM: 4 mmol/L (ref 3.5–5.1)
Sodium: 139 mmol/L (ref 135–145)

## 2017-04-06 LAB — URINALYSIS, COMPLETE (UACMP) WITH MICROSCOPIC
BACTERIA UA: NONE SEEN
Bilirubin Urine: NEGATIVE
Glucose, UA: NEGATIVE mg/dL
HGB URINE DIPSTICK: NEGATIVE
Ketones, ur: NEGATIVE mg/dL
Leukocytes, UA: NEGATIVE
NITRITE: NEGATIVE
PROTEIN: NEGATIVE mg/dL
SPECIFIC GRAVITY, URINE: 1.019 (ref 1.005–1.030)
SQUAMOUS EPITHELIAL / LPF: NONE SEEN
pH: 5 (ref 5.0–8.0)

## 2017-04-06 LAB — TROPONIN I

## 2017-04-06 LAB — CBC
HEMATOCRIT: 44.3 % (ref 40.0–52.0)
Hemoglobin: 15.3 g/dL (ref 13.0–18.0)
MCH: 29.9 pg (ref 26.0–34.0)
MCHC: 34.7 g/dL (ref 32.0–36.0)
MCV: 86.2 fL (ref 80.0–100.0)
Platelets: 196 10*3/uL (ref 150–440)
RBC: 5.14 MIL/uL (ref 4.40–5.90)
RDW: 14.3 % (ref 11.5–14.5)
WBC: 7.1 10*3/uL (ref 3.8–10.6)

## 2017-04-06 MED ORDER — ACETAMINOPHEN 325 MG PO TABS
650.0000 mg | ORAL_TABLET | Freq: Once | ORAL | Status: AC
  Administered 2017-04-06: 650 mg via ORAL
  Filled 2017-04-06: qty 2

## 2017-04-06 MED ORDER — METOCLOPRAMIDE HCL 10 MG PO TABS: 10.0000 mg | ORAL_TABLET | Freq: Four times a day (QID) | ORAL | 0 refills | Status: DC | PRN

## 2017-04-06 MED ORDER — SODIUM CHLORIDE 0.9 % IV BOLUS (SEPSIS)
1000.0000 mL | Freq: Once | INTRAVENOUS | Status: AC
  Administered 2017-04-06: 1000 mL via INTRAVENOUS

## 2017-04-06 MED ORDER — DIPHENHYDRAMINE HCL 50 MG/ML IJ SOLN
25.0000 mg | Freq: Once | INTRAMUSCULAR | Status: AC
  Administered 2017-04-06: 25 mg via INTRAVENOUS
  Filled 2017-04-06: qty 1

## 2017-04-06 MED ORDER — METOCLOPRAMIDE HCL 5 MG/ML IJ SOLN
10.0000 mg | Freq: Once | INTRAMUSCULAR | Status: AC
  Administered 2017-04-06: 10 mg via INTRAVENOUS
  Filled 2017-04-06: qty 2

## 2017-04-06 MED ORDER — IOPAMIDOL (ISOVUE-370) INJECTION 76%
100.0000 mL | Freq: Once | INTRAVENOUS | Status: AC | PRN
  Administered 2017-04-06: 100 mL via INTRAVENOUS

## 2017-04-06 NOTE — ED Triage Notes (Signed)
Pt had syncopal episode on Saturday.  Since then has been having headaches that come and go since then.  Sent from family doctor for MRI. Pt ambulatory to triage, NAD. VSS.  No vision changes. Has been dizzy.

## 2017-04-06 NOTE — ED Provider Notes (Signed)
[facility]-EMERGENCY DEPARTMENT Provider Note   CSN: [id number] Arrival date & time: 04/06/17  1138     History   Chief Complaint Chief Complaint  Patient presents with  . Loss of Consciousness  . Migraine    HPI Alex Brandt is a 41 y.o. male history of high cholesterol, hypertension here presenting with dizziness, headaches, neck pain. Patient came to the ED several days ago with heat exhaustion and had possible syncope at that time. Patient had normal lab work and normal CT angio of the chest. He states that he has progressive worsening L sided headaches since then. He noticed a bruise on the left posterior scalp that is getting more tender. He takes tylenol and headache improves and when the tylenol wears off, the headache comes back. Associated with some L sided neck pain. Denies any further syncope or trouble speaking or weakness or numbness. Denies fever or chills. Went to urgent care and sent for CT to r/o bleed.   The history is provided by the patient.    Past Medical History:  Diagnosis Date  . High cholesterol   . Hypertension     There are no active problems to display for this patient.   Past Surgical History:  Procedure Laterality Date  . APPENDECTOMY    . CHOLECYSTECTOMY         Home Medications    Prior to Admission medications   Medication Sig Start Date End Date Taking? Authorizing Provider  diazepam (VALIUM) 5 MG tablet Take 1 tablet (5 mg total) by mouth every 8 (eight) hours as needed for muscle spasms. Patient not taking: Reported on 04/01/2017 06/06/15   Emily Filbert, MD  ibuprofen (ADVIL,MOTRIN) 800 MG tablet Take 1 tablet (800 mg total) by mouth every 8 (eight) hours as needed. Patient not taking: Reported on 04/01/2017 06/06/15   Emily Filbert, MD  meclizine (ANTIVERT) 25 MG tablet Take 25 mg by mouth 3 (three) times daily as needed for dizziness.    [provider]  pravastatin (PRAVACHOL) 40 MG tablet Take 40 mg by  mouth daily.    [provider]    Family History History reviewed. No pertinent family history.  Social History Social History  Substance Use Topics  . Smoking status: Current Every Day Smoker    Packs/day: 0.50    Types: Cigarettes  . Smokeless tobacco: Never Used  . Alcohol use Yes     Allergies   Shellfish allergy and Tramadol   Review of Systems Review of Systems  Neurological: Positive for headaches.  All other systems reviewed and are negative.    Physical Exam Updated Vital Signs BP 111/75   Pulse (!) 58   Temp 98.8 F (37.1 C) (Oral)   Resp 16   Ht 6\' 5"  (1.956 m)   Wt 96.2 kg (212 lb)   SpO2 100%   BMI 25.14 kg/m   Physical Exam  Constitutional: He is oriented to person, place, and time. He appears well-developed.  HENT:  Head: Normocephalic.  Mouth/Throat: Oropharynx is clear and moist.  L posterior scalp with ? Small area of hematoma. No obvious deformity.   Eyes: Conjunctivae and EOM are normal. Pupils are equal, round, and reactive to light.  Neck: Normal range of motion. Neck supple.  + L paracervical tenderness, no deformity   Cardiovascular: Normal rate, regular rhythm and normal heart sounds.   Pulmonary/Chest: Effort normal and breath sounds normal. No respiratory distress. He has no wheezes. He has no rales.  Abdominal: Soft. Bowel sounds are normal. He exhibits no distension. There is no tenderness.  Musculoskeletal: Normal range of motion. He exhibits no edema.  Neurological: He is alert and oriented to person, place, and time. No cranial nerve deficit. Coordination normal.  Skin: Skin is warm.  Psychiatric: He has a normal mood and affect.  Nursing note and vitals reviewed.    ED Treatments / Results  Labs (all labs ordered are listed, but only abnormal results are displayed) Labs Reviewed  BASIC METABOLIC PANEL - Abnormal; Notable for the following:       Result Value   Glucose, Bld 121 (*)    Creatinine, Ser 1.36  (*)    All other components within normal limits  URINALYSIS, COMPLETE (UACMP) WITH MICROSCOPIC - Abnormal; Notable for the following:    Color, Urine YELLOW (*)    APPearance CLEAR (*)    All other components within normal limits  CBC  TROPONIN I    EKG  EKG Interpretation  Date/Time:  Thursday April 06 2017 12:02:45 EDT Ventricular Rate:  75 PR Interval:  140 QRS Duration: 92 QT Interval:  368 QTC Calculation: 410 R Axis:   64 Text Interpretation:  Normal sinus rhythm Normal ECG When compared with ECG of 01-Apr-2017 17:39, PREVIOUS ECG IS PRESENT No significant change since last tracing Confirmed by Richardean CanalYao, Damek Ende H [phone]([id number]) on 04/06/2017 3:08:02 PM       Radiology Ct Angio Head W Or Wo Contrast  Result Date: 04/06/2017 CLINICAL DATA:  Initial evaluation for acute headache. Recent head injury. EXAM: CT ANGIOGRAPHY HEAD AND NECK TECHNIQUE: Multidetector CT imaging of the head and neck was performed using the standard protocol during bolus administration of intravenous contrast. Multiplanar CT image reconstructions and MIPs were obtained to evaluate the vascular anatomy. Carotid stenosis measurements (when applicable) are obtained utilizing NASCET criteria, using the distal internal carotid diameter as the denominator. CONTRAST:  75 cc of Isovue 370. COMPARISON:  None. FINDINGS: CT HEAD FINDINGS Brain: Cerebral volume within normal limits for patient age. No evidence for acute intracranial hemorrhage. No findings to suggest acute large vessel territory infarct. No mass lesion, midline shift, or mass effect. Ventricles are normal in size without evidence for hydrocephalus. No extra-axial fluid collection identified. Vascular: No hyperdense vessel identified. Skull: Scalp soft tissues demonstrate no acute abnormality.Calvarium intact. Sinuses/Orbits: Globes and orbital soft tissues are within normal limits. Visualized paranasal sinuses are clear. No mastoid effusion. CTA NECK FINDINGS Aortic arch:  Visualized aortic arch of normal caliber with normal 3 vessel morphology. No flow-limiting stenosis about the origin of the great vessels. Visualized subclavian artery is widely patent. Right carotid system: Right common and internal carotid artery's widely patent without stenosis, dissection, or occlusion. No significant atheromatous narrowing about the right carotid bifurcation. Left carotid system: Left common and internal carotid artery's widely patent without stenosis, dissection, or occlusion. Minimal atheromatous narrowing about the proximal left ICA without stenosis. Vertebral arteries: Both of the vertebral arteries arise from the subclavian arteries. Vertebral arteries widely patent within the neck without stenosis, dissection, or occlusion. Skeleton: No acute osseous abnormality. No worrisome lytic or blastic osseous lesions. Other neck: Soft tissues of the neck demonstrate no acute abnormality. Salivary glands normal. No adenopathy. Thyroid normal. Upper chest: Visualized upper chest within normal limits. Partially visualized lungs are clear. Mild paraseptal and centrilobular emphysema. Review of the MIP images confirms the above findings CTA HEAD FINDINGS Anterior circulation: Petrous, cavernous, supraclinoid segments of the internal carotid arteries are widely patent  bilaterally without stenosis. ICA termini patent. A1 segments widely patent. Anterior communicating artery normal. Anterior cerebral artery is widely patent to their distal aspects. M1 segments widely patent without stenosis or occlusion. No proximal M2 occlusion. Distal MCA branches well opacified and symmetric. Posterior circulation: Vertebral arteries patent to the vertebrobasilar junction without stenosis. Right vertebral artery dominant. Posterior inferior cerebral arteries patent bilaterally. Basilar artery widely patent. Superior cerebral arteries patent bilaterally. Posterior cerebral arteries primarily supplied via the basilar and  are widely patent to their distal aspects. Small right posterior communicating artery noted. Venous sinuses: Patent. Anatomic variants: No significant anatomic variant. No aneurysm or vascular malformation. Delayed phase: No pathologic enhancement. Review of the MIP images confirms the above findings IMPRESSION: 1. Normal CTA of the head and neck. No acute intracranial process identified. 2. Mild emphysema. Electronically Signed   By: Rise Mu M.D.   On: 04/06/2017 17:53   Ct Angio Neck W And/or Wo Contrast  Result Date: 04/06/2017 CLINICAL DATA:  Initial evaluation for acute headache. Recent head injury. EXAM: CT ANGIOGRAPHY HEAD AND NECK TECHNIQUE: Multidetector CT imaging of the head and neck was performed using the standard protocol during bolus administration of intravenous contrast. Multiplanar CT image reconstructions and MIPs were obtained to evaluate the vascular anatomy. Carotid stenosis measurements (when applicable) are obtained utilizing NASCET criteria, using the distal internal carotid diameter as the denominator. CONTRAST:  75 cc of Isovue 370. COMPARISON:  None. FINDINGS: CT HEAD FINDINGS Brain: Cerebral volume within normal limits for patient age. No evidence for acute intracranial hemorrhage. No findings to suggest acute large vessel territory infarct. No mass lesion, midline shift, or mass effect. Ventricles are normal in size without evidence for hydrocephalus. No extra-axial fluid collection identified. Vascular: No hyperdense vessel identified. Skull: Scalp soft tissues demonstrate no acute abnormality.Calvarium intact. Sinuses/Orbits: Globes and orbital soft tissues are within normal limits. Visualized paranasal sinuses are clear. No mastoid effusion. CTA NECK FINDINGS Aortic arch: Visualized aortic arch of normal caliber with normal 3 vessel morphology. No flow-limiting stenosis about the origin of the great vessels. Visualized subclavian artery is widely patent. Right  carotid system: Right common and internal carotid artery's widely patent without stenosis, dissection, or occlusion. No significant atheromatous narrowing about the right carotid bifurcation. Left carotid system: Left common and internal carotid artery's widely patent without stenosis, dissection, or occlusion. Minimal atheromatous narrowing about the proximal left ICA without stenosis. Vertebral arteries: Both of the vertebral arteries arise from the subclavian arteries. Vertebral arteries widely patent within the neck without stenosis, dissection, or occlusion. Skeleton: No acute osseous abnormality. No worrisome lytic or blastic osseous lesions. Other neck: Soft tissues of the neck demonstrate no acute abnormality. Salivary glands normal. No adenopathy. Thyroid normal. Upper chest: Visualized upper chest within normal limits. Partially visualized lungs are clear. Mild paraseptal and centrilobular emphysema. Review of the MIP images confirms the above findings CTA HEAD FINDINGS Anterior circulation: Petrous, cavernous, supraclinoid segments of the internal carotid arteries are widely patent bilaterally without stenosis. ICA termini patent. A1 segments widely patent. Anterior communicating artery normal. Anterior cerebral artery is widely patent to their distal aspects. M1 segments widely patent without stenosis or occlusion. No proximal M2 occlusion. Distal MCA branches well opacified and symmetric. Posterior circulation: Vertebral arteries patent to the vertebrobasilar junction without stenosis. Right vertebral artery dominant. Posterior inferior cerebral arteries patent bilaterally. Basilar artery widely patent. Superior cerebral arteries patent bilaterally. Posterior cerebral arteries primarily supplied via the basilar and are widely patent to their distal  aspects. Small right posterior communicating artery noted. Venous sinuses: Patent. Anatomic variants: No significant anatomic variant. No aneurysm or  vascular malformation. Delayed phase: No pathologic enhancement. Review of the MIP images confirms the above findings IMPRESSION: 1. Normal CTA of the head and neck. No acute intracranial process identified. 2. Mild emphysema. Electronically Signed   By: Rise Mu M.D.   On: 04/06/2017 17:53    Procedures Procedures (including critical care time)  Medications Ordered in ED Medications  acetaminophen (TYLENOL) tablet 650 mg (650 mg Oral Given 04/06/17 1430)  sodium chloride 0.9 % bolus 1,000 mL (0 mLs Intravenous Stopped 04/06/17 1634)  metoCLOPramide (REGLAN) injection 10 mg (10 mg Intravenous Given 04/06/17 1513)  diphenhydrAMINE (BENADRYL) injection 25 mg (25 mg Intravenous Given 04/06/17 1513)  iopamidol (ISOVUE-370) 76 % injection 100 mL (100 mLs Intravenous Contrast Given 04/06/17 1643)     Initial Impression / Assessment and Plan / ED Course  I have reviewed the triage vital signs and the nursing notes.  Pertinent labs & imaging results that were available during my care of the patient were reviewed by me and considered in my medical decision making (see chart for details).     Alex Brandt is a 41 y.o. male here with headache. ? Head injury after syncope several days ago. No CT head performed at that time. Likely concussion. Consider SAH vs dissection as well. If CTA unremarkable, will not need LP currently. Will give migraine cocktail.  6:09 PM   Labs unremarkable. CT showed no bleed or dissection. Headache improved with migraine cocktail. Recommend continue tylenol, prn reglan for headaches. Likely post concussive syndrome.    Final Clinical Impressions(s) / ED Diagnoses   Final diagnoses:  None    New Prescriptions New Prescriptions   No medications on file     Charlynne Pander, MD 04/06/17 1810

## 2017-04-06 NOTE — Discharge Instructions (Signed)
Take tylenol, motrin for headaches.   Take reglan as needed for dizziness.   See your doctor.   Return to ER if you have severe headaches, vomiting, weakness, numbness, passing out, worse dizziness.

## 2019-02-19 ENCOUNTER — Emergency Department: Payer: Self-pay

## 2019-02-19 ENCOUNTER — Other Ambulatory Visit: Payer: Self-pay

## 2019-02-19 ENCOUNTER — Encounter: Payer: Self-pay | Admitting: *Deleted

## 2019-02-19 ENCOUNTER — Emergency Department
Admission: EM | Admit: 2019-02-19 | Discharge: 2019-02-19 | Disposition: A | Discharge status: Home / Self Care | Payer: Self-pay | Attending: Emergency Medicine | Admitting: Emergency Medicine

## 2019-02-19 DIAGNOSIS — I10 Essential (primary) hypertension: Secondary | ICD-10-CM | POA: Insufficient documentation

## 2019-02-19 DIAGNOSIS — F17210 Nicotine dependence, cigarettes, uncomplicated: Secondary | ICD-10-CM | POA: Insufficient documentation

## 2019-02-19 DIAGNOSIS — Z79899 Other long term (current) drug therapy: Secondary | ICD-10-CM | POA: Insufficient documentation

## 2019-02-19 DIAGNOSIS — R079 Chest pain, unspecified: Secondary | ICD-10-CM | POA: Insufficient documentation

## 2019-02-19 LAB — BASIC METABOLIC PANEL
Anion gap: 7 (ref 5–15)
BUN: 14 mg/dL (ref 6–20)
CO2: 27 mmol/L (ref 22–32)
Calcium: 8.8 mg/dL — ABNORMAL LOW (ref 8.9–10.3)
Chloride: 106 mmol/L (ref 98–111)
Creatinine, Ser: 1.25 mg/dL — ABNORMAL HIGH (ref 0.61–1.24)
GFR calc Af Amer: >60 mL/min (ref >60)
GFR calc non Af Amer: >60 mL/min (ref >60)
Glucose, Bld: 117 mg/dL — ABNORMAL HIGH (ref 70–99)
Potassium: 3.9 mmol/L (ref 3.5–5.1)
Sodium: 140 mmol/L (ref 135–145)

## 2019-02-19 LAB — CBC
HCT: 47.7 % (ref 39.0–52.0)
Hemoglobin: 15.7 g/dL (ref 13.0–17.0)
MCH: 29.7 pg (ref 26.0–34.0)
MCHC: 32.9 g/dL (ref 30.0–36.0)
MCV: 90.2 fL (ref 80.0–100.0)
Platelets: 211 10*3/uL (ref 150–400)
RBC: 5.29 MIL/uL (ref 4.22–5.81)
RDW: 13.6 % (ref 11.5–15.5)
WBC: 8.2 10*3/uL (ref 4.0–10.5)
nRBC: 0 % (ref 0.0–0.2)

## 2019-02-19 LAB — TROPONIN I
Troponin I: <0.03 ng/mL (ref <0.03)
Troponin I: <0.03 ng/mL (ref <0.03)

## 2019-02-19 MED ORDER — SODIUM CHLORIDE 0.9% FLUSH
3.0000 mL | Freq: Once | INTRAVENOUS | Status: AC
  Administered 2019-02-19: 3 mL via INTRAVENOUS

## 2019-02-19 NOTE — ED Provider Notes (Signed)
[facility] Emergency Department Provider Note  ____________________________________________   I have reviewed the triage vital signs and the nursing notes.   HISTORY  Chief Complaint Chest Pain   History limited by: Not Limited   HPI Alex Brandt is a 43 y.o. male who presents to the emergency department today because of concern for chest pain. The pain is located in the left chest. Feels like someone is stepping on him. The patient states that he also had pain down his left arm. The patient had some shortness of breath but denies any diaphoresis. The patient had been out walking looking for a lost cow but denies any significant exertion. The patient denies any fevers. The patient denies any history of heart disease for himself but both parents had heart disease.  Records reviewed. Per medical record review patient has a history of high cholesterol, hypertension.  Past Medical History:  Diagnosis Date  . High cholesterol   . Hypertension     There are no active problems to display for this patient.   Past Surgical History:  Procedure Laterality Date  . APPENDECTOMY    . CHOLECYSTECTOMY      Prior to Admission medications   Medication Sig Start Date End Date Taking? Authorizing Provider  diazepam (VALIUM) 5 MG tablet Take 1 tablet (5 mg total) by mouth every 8 (eight) hours as needed for muscle spasms. Patient not taking: Reported on 04/01/2017 06/06/15   Emily FilbertWilliams, Jonathan E, MD  ibuprofen (ADVIL,MOTRIN) 800 MG tablet Take 1 tablet (800 mg total) by mouth every 8 (eight) hours as needed. Patient not taking: Reported on 04/01/2017 06/06/15   Emily FilbertWilliams, Jonathan E, MD  meclizine (ANTIVERT) 25 MG tablet Take 25 mg by mouth 3 (three) times daily as needed for dizziness.    [provider]  metoCLOPramide (REGLAN) 10 MG tablet Take 1 tablet (10 mg total) by mouth every 6 (six) hours as needed for nausea (nausea/headache). 04/06/17   Charlynne PanderYao, David  Hsienta, MD  pravastatin (PRAVACHOL) 40 MG tablet Take 40 mg by mouth daily.    [provider]    Allergies Shellfish allergy and Tramadol  No family history on file.  Social History Social History   Tobacco Use  . Smoking status: Current Every Day Smoker    Packs/day: 0.50    Types: Cigarettes  . Smokeless tobacco: Never Used  Substance Use Topics  . Alcohol use: Yes  . Drug use: Not on file    Review of Systems Constitutional: No fever/chills Eyes: No visual changes. ENT: No sore throat. Cardiovascular: Positive for chest pain. Respiratory: Positive  shortness of breath. Gastrointestinal: No abdominal pain.  No nausea, no vomiting.  No diarrhea.   Genitourinary: Negative for dysuria. Musculoskeletal: Negative for back pain. Skin: Negative for rash. Neurological: Negative for headaches, focal weakness or numbness.  ____________________________________________   PHYSICAL EXAM:  VITAL SIGNS: ED Triage Vitals  Enc Vitals Group     BP 02/19/19 0326 127/70     Pulse Rate 02/19/19 0326 67     Resp 02/19/19 0326 14     Temp 02/19/19 0326 98.4 F (36.9 C)     Temp src --      SpO2 02/19/19 0326 100 %     Weight 02/19/19 0328 225 lb (102.1 kg)     Height 02/19/19 0328 6\' 4"  (1.93 m)     Head Circumference --      Peak Flow --      Pain Score  02/19/19 0328 3    Constitutional: Alert and oriented.  Eyes: Conjunctivae are normal.  ENT      Head: Normocephalic and atraumatic.      Nose: No congestion/rhinnorhea.      Mouth/Throat: Mucous membranes are moist.      Neck: No stridor. Hematological/Lymphatic/Immunilogical: No cervical lymphadenopathy. Cardiovascular: Normal rate, regular rhythm.  No murmurs, rubs, or gallops. Respiratory: Normal respiratory effort without tachypnea nor retractions. Breath sounds are clear and equal bilaterally. No wheezes/rales/rhonchi. Gastrointestinal: Soft and non tender. No rebound. No guarding.  Genitourinary:  Deferred Musculoskeletal: Normal range of motion in all extremities. No lower extremity edema. Neurologic:  Normal speech and language. No gross focal neurologic deficits are appreciated.  Skin:  Skin is warm, dry and intact. No rash noted. Psychiatric: Mood and affect are normal. Speech and behavior are normal. Patient exhibits appropriate insight and judgment.  ____________________________________________    LABS (pertinent positives/negatives)  Trop <0.03 x 2 CBC wbc 8.2, hgb 15.7, plt 211 BMP na 140, k 3.9, glu 117, cr 1.25  ____________________________________________   EKG  I, Phineas Semen, attending physician, personally viewed and interpreted this EKG  EKG Time: 0327 Rate: 71 Rhythm: sinus rhythm Axis: normal Intervals: qtc 408 QRS: narrow ST changes: no st elevation Impression: normal ekg   ____________________________________________    RADIOLOGY  CXR No acute abnormality  ____________________________________________   PROCEDURES  Procedures  ____________________________________________   INITIAL IMPRESSION / ASSESSMENT AND PLAN / ED COURSE  Pertinent labs & imaging results that were available during my care of the patient were reviewed by me and considered in my medical decision making (see chart for details).   Patient presented to the emergency department today because of concerns for chest pain.  Patient does have some risk factors.  Patient's EKG without any ST elevation.  Troponins were negative x2.  At this point I have low suspicion for heart cell injury.  Did however discuss with patient possibility of straining his heart and recommended close cardiology follow-up for possible stress test.   ____________________________________________   FINAL CLINICAL IMPRESSION(S) / ED DIAGNOSES  Final diagnoses:  Nonspecific chest pain     Note: This dictation was prepared with Dragon dictation. Any transcriptional errors that result from  this process are unintentional     Phineas Semen, MD 02/19/19 [phone]

## 2019-02-19 NOTE — Discharge Instructions (Addendum)
Please seek medical attention for any high fevers, chest pain, shortness of breath, change in behavior, persistent vomiting, bloody stool or any other new or concerning symptoms.  

## 2019-02-19 NOTE — ED Notes (Signed)
Patient AAOx4. Vitals Stable. NAD. 

## 2019-02-19 NOTE — ED Triage Notes (Signed)
Per EMS and pt he was [place] a [place] the pasture and on arriving back home he felt chest pain radiating down his right arm. 7/10 pain. Pt took a BC powder at 1am. 2 SL nitro and 1 in paste by EMS. Pain 3/10

## 2020-05-07 DIAGNOSIS — S83231A Complex tear of medial meniscus, current injury, right knee, initial encounter: Secondary | ICD-10-CM | POA: Insufficient documentation

## 2020-06-27 ENCOUNTER — Emergency Department: Payer: Medicaid Other

## 2020-06-27 ENCOUNTER — Other Ambulatory Visit: Payer: Self-pay

## 2020-06-27 ENCOUNTER — Emergency Department
Admission: EM | Admit: 2020-06-27 | Discharge: 2020-06-29 | Disposition: A | Discharge status: Home / Self Care | Payer: Self-pay | Attending: Emergency Medicine | Admitting: Emergency Medicine

## 2020-06-27 DIAGNOSIS — R0789 Other chest pain: Secondary | ICD-10-CM | POA: Insufficient documentation

## 2020-06-27 DIAGNOSIS — M25512 Pain in left shoulder: Secondary | ICD-10-CM | POA: Insufficient documentation

## 2020-06-27 DIAGNOSIS — Z5321 Procedure and treatment not carried out due to patient leaving prior to being seen by health care provider: Secondary | ICD-10-CM | POA: Insufficient documentation

## 2020-06-27 LAB — CBC
HCT: 40.7 % (ref 39.0–52.0)
Hemoglobin: 13.9 g/dL (ref 13.0–17.0)
MCH: 30.3 pg (ref 26.0–34.0)
MCHC: 34.2 g/dL (ref 30.0–36.0)
MCV: 88.9 fL (ref 80.0–100.0)
Platelets: 224 10*3/uL (ref 150–400)
RBC: 4.58 MIL/uL (ref 4.22–5.81)
RDW: 13.4 % (ref 11.5–15.5)
WBC: 8.8 10*3/uL (ref 4.0–10.5)
nRBC: 0 % (ref 0.0–0.2)

## 2020-06-27 LAB — BASIC METABOLIC PANEL
Anion gap: 10 (ref 5–15)
BUN: 13 mg/dL (ref 6–20)
CO2: 26 mmol/L (ref 22–32)
Calcium: 8.6 mg/dL — ABNORMAL LOW (ref 8.9–10.3)
Chloride: 101 mmol/L (ref 98–111)
Creatinine, Ser: 1.2 mg/dL (ref 0.61–1.24)
GFR calc Af Amer: >60 mL/min (ref >60)
GFR calc non Af Amer: >60 mL/min (ref >60)
Glucose, Bld: 116 mg/dL — ABNORMAL HIGH (ref 70–99)
Potassium: 3.1 mmol/L — ABNORMAL LOW (ref 3.5–5.1)
Sodium: 137 mmol/L (ref 135–145)

## 2020-06-27 LAB — TROPONIN I (HIGH SENSITIVITY)
Troponin I (High Sensitivity): 4 ng/L (ref <18)
Troponin I (High Sensitivity): 3 ng/L (ref <18)

## 2020-06-27 LAB — FIBRIN DERIVATIVES D-DIMER (ARMC ONLY): Fibrin derivatives D-dimer (ARMC): 695.44 ng/mL (FEU) — ABNORMAL HIGH (ref 0.00–499.00)

## 2020-06-27 NOTE — ED Triage Notes (Signed)
Patient coming [facility] from home for chest pain. Patient describes pain as tightness. Patient begins in left chest, radiates to shoulder. Patient rates pain at 7 of 10, patient given 2 of nitro - pain decreased to 5 after second nitro.  Right meniscus repaired at [facility] a couple of weeks ago.

## 2020-06-27 NOTE — ED Notes (Signed)
Patient transported to X-ray 

## 2021-04-22 ENCOUNTER — Other Ambulatory Visit
Admission: RE | Admit: 2021-04-22 | Discharge: 2021-04-22 | Disposition: A | Discharge status: Home / Self Care | Payer: Medicaid Other | Source: Ambulatory Visit | Attending: Student | Admitting: Student

## 2021-04-22 DIAGNOSIS — Z01818 Encounter for other preprocedural examination: Secondary | ICD-10-CM | POA: Diagnosis not present

## 2021-04-22 LAB — CBC
HCT: 51.1 % (ref 39.0–52.0)
Hemoglobin: 17.2 g/dL — ABNORMAL HIGH (ref 13.0–17.0)
MCH: 30.6 pg (ref 26.0–34.0)
MCHC: 33.7 g/dL (ref 30.0–36.0)
MCV: 90.9 fL (ref 80.0–100.0)
Platelets: 233 10*3/uL (ref 150–400)
RBC: 5.62 MIL/uL (ref 4.22–5.81)
RDW: 13.7 % (ref 11.5–15.5)
WBC: 6.8 10*3/uL (ref 4.0–10.5)
nRBC: 0 % (ref 0.0–0.2)

## 2021-04-22 LAB — BRAIN NATRIURETIC PEPTIDE: B Natriuretic Peptide: 13.1 pg/mL (ref 0.0–100.0)

## 2021-05-05 ENCOUNTER — Other Ambulatory Visit: Payer: Self-pay

## 2021-05-05 ENCOUNTER — Encounter: Payer: Self-pay | Admitting: Internal Medicine

## 2021-05-05 ENCOUNTER — Ambulatory Visit
Admission: RE | Admit: 2021-05-05 | Discharge: 2021-05-05 | Disposition: A | Discharge status: Home / Self Care | Payer: Medicaid Other | Attending: Internal Medicine | Admitting: Internal Medicine

## 2021-05-05 ENCOUNTER — Encounter
Admission: RE | Disposition: A | Discharge status: Home / Self Care | Payer: Self-pay | Source: Home / Self Care | Attending: Internal Medicine

## 2021-05-05 DIAGNOSIS — F17210 Nicotine dependence, cigarettes, uncomplicated: Secondary | ICD-10-CM | POA: Diagnosis not present

## 2021-05-05 DIAGNOSIS — Z8249 Family history of ischemic heart disease and other diseases of the circulatory system: Secondary | ICD-10-CM | POA: Insufficient documentation

## 2021-05-05 DIAGNOSIS — Z7982 Long term (current) use of aspirin: Secondary | ICD-10-CM | POA: Diagnosis not present

## 2021-05-05 DIAGNOSIS — E785 Hyperlipidemia, unspecified: Secondary | ICD-10-CM | POA: Insufficient documentation

## 2021-05-05 DIAGNOSIS — I2510 Atherosclerotic heart disease of native coronary artery without angina pectoris: Secondary | ICD-10-CM

## 2021-05-05 DIAGNOSIS — Z79899 Other long term (current) drug therapy: Secondary | ICD-10-CM | POA: Diagnosis not present

## 2021-05-05 DIAGNOSIS — Z9049 Acquired absence of other specified parts of digestive tract: Secondary | ICD-10-CM | POA: Diagnosis not present

## 2021-05-05 DIAGNOSIS — I25110 Atherosclerotic heart disease of native coronary artery with unstable angina pectoris: Secondary | ICD-10-CM | POA: Diagnosis not present

## 2021-05-05 DIAGNOSIS — I200 Unstable angina: Secondary | ICD-10-CM

## 2021-05-05 HISTORY — PX: LEFT HEART CATH AND CORONARY ANGIOGRAPHY: CATH118249

## 2021-05-05 HISTORY — DX: Atherosclerotic heart disease of native coronary artery without angina pectoris: I25.10

## 2021-05-05 LAB — CARDIAC CATHETERIZATION: Cath EF Quantitative: 60 %

## 2021-05-05 SURGERY — LEFT HEART CATH AND CORONARY ANGIOGRAPHY
Anesthesia: Moderate Sedation | Laterality: Left

## 2021-05-05 MED ORDER — HEPARIN (PORCINE) IN NACL 1000-0.9 UT/500ML-% IV SOLN
INTRAVENOUS | Status: AC
  Filled 2021-05-05: qty 1000

## 2021-05-05 MED ORDER — ASPIRIN 81 MG PO CHEW
81.0000 mg | CHEWABLE_TABLET | ORAL | Status: AC
  Administered 2021-05-05: 81 mg via ORAL

## 2021-05-05 MED ORDER — MIDAZOLAM HCL 2 MG/2ML IJ SOLN
INTRAMUSCULAR | Status: DC | PRN
  Administered 2021-05-05: 1 mg via INTRAVENOUS

## 2021-05-05 MED ORDER — ASPIRIN 81 MG PO CHEW
CHEWABLE_TABLET | ORAL | Status: AC
  Filled 2021-05-05: qty 1

## 2021-05-05 MED ORDER — ONDANSETRON HCL 4 MG/2ML IJ SOLN: 4.0000 mg | Freq: Four times a day (QID) | INTRAMUSCULAR | Status: DC | PRN

## 2021-05-05 MED ORDER — FENTANYL CITRATE (PF) 100 MCG/2ML IJ SOLN
INTRAMUSCULAR | Status: DC | PRN
  Administered 2021-05-05: 50 ug via INTRAVENOUS

## 2021-05-05 MED ORDER — SODIUM CHLORIDE 0.9 % WEIGHT BASED INFUSION: 1.0000 mL/kg/h | INTRAVENOUS | Status: DC

## 2021-05-05 MED ORDER — SODIUM CHLORIDE 0.9% FLUSH: 3.0000 mL | INTRAVENOUS | Status: DC | PRN

## 2021-05-05 MED ORDER — VERAPAMIL HCL 2.5 MG/ML IV SOLN
INTRAVENOUS | Status: AC
  Filled 2021-05-05: qty 2

## 2021-05-05 MED ORDER — SODIUM CHLORIDE 0.9% FLUSH: 3.0000 mL | Freq: Two times a day (BID) | INTRAVENOUS | Status: DC

## 2021-05-05 MED ORDER — SODIUM CHLORIDE 0.9 % IV SOLN: 250.0000 mL | INTRAVENOUS | Status: DC | PRN

## 2021-05-05 MED ORDER — HEPARIN SODIUM (PORCINE) 1000 UNIT/ML IJ SOLN
INTRAMUSCULAR | Status: AC
  Filled 2021-05-05: qty 1

## 2021-05-05 MED ORDER — MIDAZOLAM HCL 2 MG/2ML IJ SOLN
INTRAMUSCULAR | Status: AC
  Filled 2021-05-05: qty 2

## 2021-05-05 MED ORDER — VERAPAMIL HCL 2.5 MG/ML IV SOLN
INTRAVENOUS | Status: DC | PRN
  Administered 2021-05-05: 2.5 mg via INTRAVENOUS

## 2021-05-05 MED ORDER — LIDOCAINE HCL (PF) 1 % IJ SOLN
INTRAMUSCULAR | Status: DC | PRN
  Administered 2021-05-05: 2 mL

## 2021-05-05 MED ORDER — ACETAMINOPHEN 325 MG PO TABS: 650.0000 mg | ORAL_TABLET | ORAL | Status: DC | PRN

## 2021-05-05 MED ORDER — FENTANYL CITRATE (PF) 100 MCG/2ML IJ SOLN
INTRAMUSCULAR | Status: AC
  Filled 2021-05-05: qty 2

## 2021-05-05 MED ORDER — LIDOCAINE HCL (PF) 1 % IJ SOLN
INTRAMUSCULAR | Status: AC
  Filled 2021-05-05: qty 30

## 2021-05-05 MED ORDER — SODIUM CHLORIDE 0.9 % WEIGHT BASED INFUSION
3.0000 mL/kg/h | INTRAVENOUS | Status: AC
  Administered 2021-05-05: 3 mL/kg/h via INTRAVENOUS

## 2021-05-05 MED ORDER — HEPARIN SODIUM (PORCINE) 1000 UNIT/ML IJ SOLN
INTRAMUSCULAR | Status: DC | PRN
  Administered 2021-05-05: 4000 [IU] via INTRAVENOUS

## 2021-05-05 MED ORDER — HEPARIN (PORCINE) IN NACL 2000-0.9 UNIT/L-% IV SOLN
INTRAVENOUS | Status: DC | PRN
  Administered 2021-05-05: 1000 mL

## 2021-05-05 MED ORDER — LABETALOL HCL 5 MG/ML IV SOLN: 10.0000 mg | INTRAVENOUS | Status: DC | PRN

## 2021-05-05 MED ORDER — IOHEXOL 300 MG/ML  SOLN
INTRAMUSCULAR | Status: DC | PRN
  Administered 2021-05-05: 68 mL

## 2021-05-05 MED ORDER — HYDRALAZINE HCL 20 MG/ML IJ SOLN: 10.0000 mg | INTRAMUSCULAR | Status: DC | PRN

## 2021-05-05 SURGICAL SUPPLY — 13 items
CATH INFINITI 5 FR JL3.5 (CATHETERS) ×2 IMPLANT
CATH INFINITI JR4 5F (CATHETERS) ×2 IMPLANT
DEVICE RAD TR BAND REGULAR (VASCULAR PRODUCTS) ×2 IMPLANT
DRAPE BRACHIAL (DRAPES) ×2 IMPLANT
GLIDESHEATH SLEND SS 6F .021 (SHEATH) ×2 IMPLANT
GUIDEWIRE INQWIRE 1.5J.035X260 (WIRE) ×1 IMPLANT
INQWIRE 1.5J .035X260CM (WIRE) ×2
KIT SYRINGE INJ CVI SPIKEX1 (MISCELLANEOUS) ×2 IMPLANT
PACK CARDIAC CATH (CUSTOM PROCEDURE TRAY) ×2 IMPLANT
PROTECTION STATION PRESSURIZED (MISCELLANEOUS) ×2
SET ATX SIMPLICITY (MISCELLANEOUS) ×2 IMPLANT
STATION PROTECTION PRESSURIZED (MISCELLANEOUS) ×1 IMPLANT
WIRE NITINOL .018 (WIRE) ×2 IMPLANT

## 2021-05-06 ENCOUNTER — Encounter: Payer: Self-pay | Admitting: Internal Medicine

## 2021-08-24 ENCOUNTER — Other Ambulatory Visit: Payer: Self-pay | Admitting: Orthopedic Surgery

## 2021-08-26 ENCOUNTER — Encounter
Admission: RE | Admit: 2021-08-26 | Discharge: 2021-08-26 | Disposition: A | Discharge status: Home / Self Care | Payer: Medicaid Other | Source: Ambulatory Visit | Attending: Orthopedic Surgery | Admitting: Orthopedic Surgery

## 2021-08-26 ENCOUNTER — Other Ambulatory Visit: Payer: Self-pay

## 2021-08-26 DIAGNOSIS — I2510 Atherosclerotic heart disease of native coronary artery without angina pectoris: Secondary | ICD-10-CM

## 2021-08-26 HISTORY — DX: Unspecified osteoarthritis, unspecified site: M19.90

## 2021-08-26 HISTORY — DX: Gastro-esophageal reflux disease without esophagitis: K21.9

## 2021-08-26 HISTORY — DX: Hyperkalemia: E87.5

## 2021-08-26 NOTE — Patient Instructions (Signed)
Your procedure is scheduled on:08-30-21 Monday Report to the Registration Desk on the 1st floor of the Medical Mall.Then proceed to the 2nd floor Surgery Desk in the Medical Mall To find out your arrival time, please call [phone] between 1PM - 3PM on:08-27-21 Friday  REMEMBER: Instructions that are not followed completely may result in serious medical risk, up to and including death; or upon the discretion of your surgeon and anesthesiologist your surgery may need to be rescheduled.  Do not eat food after midnight the night before surgery.  No gum chewing, lozengers or hard candies.  You may however, drink CLEAR liquids up to 2 hours before you are scheduled to arrive for your surgery. Do not drink anything within 2 hours of your scheduled arrival time.  Clear liquids include: - water  - apple juice without pulp - gatorade (not RED, PURPLE, OR BLUE) - black coffee or tea (Do NOT add milk or creamers to the coffee or tea) Do NOT drink anything that is not on this list.  In addition, your doctor has ordered for you to drink the provided  Ensure Pre-Surgery Clear Carbohydrate Drink  Drinking this carbohydrate drink up to two hours before surgery helps to reduce insulin resistance and improve patient outcomes. Please complete drinking 2 hours prior to scheduled arrival time.  TAKE THESE MEDICATIONS THE MORNING OF SURGERY WITH A SIP OF WATER: -omeprazole (PRILOSEC) 20 MG capsule-take one the night before and one on the morning of surgery - helps to prevent nausea after surgery.)  Call Dr Eliane Decree office today (08-26-21) to find out when you need to stop your aspirin 81 MG chewable tablet  One week prior to surgery: Stop Anti-inflammatories (NSAIDS) such as Advil, Aleve, Ibuprofen, Motrin, Naproxen, Naprosyn and Aspirin based products such as Excedrin, Goodys Powder, BC Powder.You may however, continue to take Tylenol if needed for pain up until the day of surgery.  Stop ANY OVER THE  COUNTER supplements/vitamins NOW (08-26-21) until after surgery.  No Alcohol for 24 hours before or after surgery.  No Smoking including e-cigarettes for 24 hours prior to surgery.  No chewable tobacco products for at least 6 hours prior to surgery.  No nicotine patches on the day of surgery.  Do not use any "recreational" drugs for at least a week prior to your surgery.  Please be advised that the combination of cocaine and anesthesia may have negative outcomes, up to and including death. If you test positive for cocaine, your surgery will be cancelled.  On the morning of surgery brush your teeth with toothpaste and water, you may rinse your mouth with mouthwash if you wish. Do not swallow any toothpaste or mouthwash.  Use CHG Soap as directed on instruction sheet.  Do not wear jewelry, make-up, hairpins, clips or nail polish.  Do not wear lotions, powders, or perfumes.   Do not shave body from the neck down 48 hours prior to surgery just in case you cut yourself which could leave a site for infection.  Also, freshly shaved skin may become irritated if using the CHG soap.  Contact lenses, hearing aids and dentures may not be worn into surgery.  Do not bring valuables to the hospital. [facility] is not responsible for any missing/lost belongings or valuables.  Notify your doctor if there is any change in your medical condition (cold, fever, infection).  Wear comfortable clothing (specific to your surgery type) to the hospital.  After surgery, you can help prevent lung complications by doing  breathing exercises.  Take deep breaths and cough every 1-2 hours. Your doctor may order a device called an Incentive Spirometer to help you take deep breaths. When coughing or sneezing, hold a pillow firmly against your incision with both hands. This is called "splinting." Doing this helps protect your incision. It also decreases belly discomfort.  If you are being admitted to the hospital  overnight, leave your suitcase in the car. After surgery it may be brought to your room.  If you are being discharged the day of surgery, you will not be allowed to drive home. You will need a responsible adult (18 years or older) to drive you home and stay with you that night.   If you are taking public transportation, you will need to have a responsible adult (18 years or older) with you. Please confirm with your physician that it is acceptable to use public transportation.   Please call the Pre-admissions Testing Dept. at [phone] if you have any questions about these instructions.  Surgery Visitation Policy:  Patients undergoing a surgery or procedure may have one family member or support person with them as long as that person is not COVID-19 positive or experiencing its symptoms.  That person may remain in the waiting area during the procedure and may rotate out with other people.  Inpatient Visitation:    Visiting hours are 7 a.m. to 8 p.m. Up to two visitors ages 16+ are allowed at one time in a patient room. The visitors may rotate out with other people during the day. Visitors must check out when they leave, or other visitors will not be allowed. One designated support person may remain overnight. The visitor must pass COVID-19 screenings, use hand sanitizer when entering and exiting the patient's room and wear a mask at all times, including in the patient's room. Patients must also wear a mask when staff or their visitor are in the room. Masking is required regardless of vaccination status.

## 2021-08-27 ENCOUNTER — Encounter
Admission: RE | Admit: 2021-08-27 | Discharge: 2021-08-27 | Disposition: A | Discharge status: Home / Self Care | Payer: Medicaid Other | Source: Ambulatory Visit | Attending: Orthopedic Surgery | Admitting: Orthopedic Surgery

## 2021-08-27 ENCOUNTER — Encounter: Payer: Self-pay | Admitting: Orthopedic Surgery

## 2021-08-27 DIAGNOSIS — Z01812 Encounter for preprocedural laboratory examination: Secondary | ICD-10-CM | POA: Diagnosis present

## 2021-08-27 DIAGNOSIS — I2510 Atherosclerotic heart disease of native coronary artery without angina pectoris: Secondary | ICD-10-CM | POA: Diagnosis not present

## 2021-08-27 LAB — CBC
HCT: 49.4 % (ref 39.0–52.0)
Hemoglobin: 16.1 g/dL (ref 13.0–17.0)
MCH: 28.9 pg (ref 26.0–34.0)
MCHC: 32.6 g/dL (ref 30.0–36.0)
MCV: 88.5 fL (ref 80.0–100.0)
Platelets: 217 10*3/uL (ref 150–400)
RBC: 5.58 MIL/uL (ref 4.22–5.81)
RDW: 14.1 % (ref 11.5–15.5)
WBC: 8.4 10*3/uL (ref 4.0–10.5)
nRBC: 0 % (ref 0.0–0.2)

## 2021-08-27 LAB — BASIC METABOLIC PANEL
Anion gap: 8 (ref 5–15)
BUN: 15 mg/dL (ref 6–20)
CO2: 26 mmol/L (ref 22–32)
Calcium: 8.9 mg/dL (ref 8.9–10.3)
Chloride: 103 mmol/L (ref 98–111)
Creatinine, Ser: 1.34 mg/dL — ABNORMAL HIGH (ref 0.61–1.24)
GFR, Estimated: >60 mL/min (ref >60)
Glucose, Bld: 79 mg/dL (ref 70–99)
Potassium: 4.1 mmol/L (ref 3.5–5.1)
Sodium: 137 mmol/L (ref 135–145)

## 2021-08-27 NOTE — Progress Notes (Signed)
Perioperative Services  Pre-Admission/Anesthesia Testing Clinical Review  Date: 08/27/21  Patient Demographics:  Name: Alex Brandt DOB:   Jan 21, 1976 MRN:   [id number]  Planned Surgical Procedure(s):    Case: [id number] Date/Time: 08/30/21 1115   Procedure: Right knee arthroscopic partial medial meniscectomy (Right: Knee)   Anesthesia type: Choice   Pre-op diagnosis: Complex tear of medial meniscus of right knee as current injury, initial encounter  S83.231A   Location: [facility] OR ROOM 08 / [facility]   Surgeons: Signa Kell, MD   NOTE: Available PAT nursing documentation and vital signs have been reviewed. Clinical nursing staff has updated patient's PMH/PSHx, current medication list, and drug allergies/intolerances to ensure comprehensive history available to assist in medical decision making as it pertains to the aforementioned surgical procedure and anticipated anesthetic course. Extensive review of available clinical information performed. [place] PMH and PSHx updated with any diagnoses/procedures that  may have been inadvertently omitted during his intake with the pre-admission testing department's nursing staff.  Clinical Discussion:  Alex Brandt is a 45 y.o. male who is submitted for pre-surgical anesthesia review and clearance prior to him undergoing the above procedure. Patient is a Current Smoker (10 pack years). Pertinent PMH includes: CAD, angina, valvular insufficiency, HTN, HLD, shortness of breath, GERD (on daily PPI), DJD, OA.  Patient is followed by cardiology Juliann Pares, MD). He was last seen in the cardiology clinic on 08/19/2021; notes reviewed. At the time of his clinic visit, the patient denied any shortness of breath, PND, orthopnea, palpitations, significant peripheral edema, vertiginous symptoms, or presyncope/syncope.  Patient experiencing episodic angina at rest for which she has NTG to use on a as needed basis; no recent use of this  medication.  Patient's main complaint was related to his LEFT knee.  Patient with a PMH significant for cardiovascular diagnoses.  TTE performed on 12/18/2014 demonstrated an overall normal left ventricular systolic function with an EF of >55%.  There was mild pulmonary and moderate mitral/tricuspid valve regurgitation. There was no transvalvular gradient to suggest significant stenosis.  Myocardial perfusion imaging study performed on 12/18/2014 demonstrated a normal left ventricular systolic function; LVEF 50%.  Left ventricular cavity was enlarged, however there were no regional wall motion abnormalities.  There was no evidence of stress-induced myocardial ischemia or arrhythmia.  Patient underwent diagnostic left heart catheterization on 05/05/2021 revealing a normal left ventricular systolic function with an EF of 60%.  There was multivessel nonobstructive CAD; 50% mid LAD, 50% D1, and 50% proximal to mid RCA.  LVEDP normal.  Intervention deferred opting for medical management.  Blood pressure well controlled at 122/76 on currently prescribed CCB and beta-blocker therapies.  Patient is on a statin for his HLD and further ASCVD prevention.  Patient is not diabetic. Functional capacity, as defined by DASI, is documented as being >/= 4 METS.  No changes were made to his medication regimen.  Patient follow-up with outpatient cardiology in 6 months or sooner if needed.  MERIT HERPEL is scheduled for an elective RIGHT KNEE ARTHROSCOPY WITH PARTIAL MEDIAL MENISCECTOMY on 08/30/2021 with Dr. Signa Kell, MD.  Given patient's past medical history significant for cardiovascular diagnoses, presurgical cardiac clearance was sought by the PAT team. Per cardiology, "this patient is optimized for surgery and may proceed with the planned procedural course with a ACCEPTABLE risk of significant perioperative cardiovascular complications". This patient is on daily antiplatelet therapy. He has been instructed on  recommendations for holding his daily low-dose ASA for prior  to his procedure with plans to restart as soon as postoperative bleeding risk felt to be minimized by his attending surgeon. The patient has been instructed that his last dose of his ASA will be on 08/26/2021.  Patient denies previous perioperative complications with anesthesia in the past. In review of the available records, it is noted that patient underwent a general anesthetic course at [facility] (ASA II) in 05/2020 without documented complications.   Vitals with BMI 05/05/2021 05/05/2021 05/05/2021  Height - - -  Weight - - -  BMI - - -  Systolic 121 111 -  Diastolic 76 67 -  Pulse 64 69 64    Providers/Specialists:   NOTE: Primary physician provider listed below. Patient may have been seen by APP or partner within same practice.   PROVIDER ROLE / SPECIALTY LAST Sarina Ser, MD Orthopedics 08/24/2021  [facility] Primary Care Provider ???  Rudean Hitt, MD Cardiology 08/19/2021   Allergies:  Shellfish allergy, Hydrocodone, and Tramadol  Current Home Medications:   No current facility-administered medications for this encounter.    aspirin 81 MG chewable tablet   capsaicin (ZOSTRIX) 0.025 % cream   diclofenac Sodium (VOLTAREN) 1 % GEL   diltiazem (CARDIZEM CD) 120 MG 24 hr capsule   ibuprofen (ADVIL) 800 MG tablet   nitroGLYCERIN (NITROSTAT) 0.4 MG SL tablet   omeprazole (PRILOSEC) 20 MG capsule   pravastatin (PRAVACHOL) 40 MG tablet   History:   Past Medical History:  Diagnosis Date   Angina at rest [facility])    Arthritis    Coronary artery disease 05/05/2021   a.) LHC 05/05/2021: EF 55-65%; normal LV function and LVEDP; 50% mLAD, 50% D1, 50% p-mRCA; med mgmt.   DJD (degenerative joint disease) of knee    Dyspnea    GERD (gastroesophageal reflux disease)    HLD (hyperlipidemia)    Hyperkalemia    Hypertension    Migraines    Valvular insufficiency 12/18/2014   a.)  TTE 12/18/2014: EF >55%; mild PR, moderate MR/TR.   Past Surgical History:  Procedure Laterality Date   APPENDECTOMY     CHOLECYSTECTOMY     COLONOSCOPY     ESOPHAGOGASTRODUODENOSCOPY     KNEE SURGERY Right    LEFT HEART CATH AND CORONARY ANGIOGRAPHY Left 05/05/2021   Procedure: LEFT HEART CATH AND CORONARY ANGIOGRAPHY;  Surgeon: Alwyn Pea, MD;  Location: [facility] INVASIVE CV LAB;  Service: Cardiovascular;  Laterality: Left;   No family history on file. Social History   Tobacco Use   Smoking status: Every Day    Packs/day: 0.50    Years: 20.00    Pack years: 10.00    Types: Cigarettes   Smokeless tobacco: Never  Vaping Use   Vaping Use: Never used  Substance Use Topics   Alcohol use: Not Currently   Drug use: Never    Pertinent Clinical Results:  LABS: Labs reviewed: Acceptable for surgery.  Hospital Outpatient Visit on 08/27/2021  Component Date Value Ref Range Status   WBC 08/27/2021 8.4  4.0 - 10.5 K/uL Final   RBC 08/27/2021 5.58  4.22 - 5.81 MIL/uL Final   Hemoglobin 08/27/2021 16.1  13.0 - 17.0 g/dL Final   HCT 16/38/4665 49.4  39.0 - 52.0 % Final   MCV 08/27/2021 88.5  80.0 - 100.0 fL Final   MCH 08/27/2021 28.9  26.0 - 34.0 pg Final   MCHC 08/27/2021 32.6  30.0 - 36.0 g/dL Final   RDW  08/27/2021 14.1  11.5 - 15.5 % Final   Platelets 08/27/2021 217  150 - 400 K/uL Final   nRBC 08/27/2021 0.0  0.0 - 0.2 % Final   Performed at [facility], [address] [place], [place] [address]   Sodium 08/27/2021 137  135 - 145 mmol/L Final   Potassium 08/27/2021 4.1  3.5 - 5.1 mmol/L Final   Chloride 08/27/2021 103  98 - 111 mmol/L Final   CO2 08/27/2021 26  22 - 32 mmol/L Final   Glucose, Bld 08/27/2021 79  70 - 99 mg/dL Final   Glucose reference range applies only to samples taken after fasting for at least 8 hours.   BUN 08/27/2021 15  6 - 20 mg/dL Final   Creatinine, Ser 08/27/2021 1.34 (H)  0.61 - 1.24 mg/dL Final   Calcium 60/45/4098 8.9  8.9 -  10.3 mg/dL Final   GFR, Estimated 08/27/2021 >60  >60 mL/min Final   Comment: (NOTE) Calculated using the CKD-EPI Creatinine Equation (2021)    Anion gap 08/27/2021 8  5 - 15 Final   Performed at [facility], [address] [place], [place] [address]    ECG: Date: 05/05/2021 Time ECG obtained: 1304 PM Rate: 60 bpm Rhythm:  Sinus rhythm Axis (leads I and aVF): Normal Intervals: PR 137 ms. QRS 82 ms. QTc 390 ms. ST segment and T wave changes: No evidence of acute ST segment elevation or depression.  Comparison: Similar to previous tracing obtained on 06/27/2020, however inferior TWIs are no longer present   IMAGING / PROCEDURES: MRI LOWER EXTREMITY JOINT RIGHT WITHOUT CONTRAST performed on 08/02/2021 Postsurgical changes from the prior posterior horn medial meniscus repair Increased abnormal signal of the medial meniscus extending into the body and posterior horn near the posterior root with extrusion into the medial recess suggesting progression of tear. Mild medial femorotibial chondrosis with mild subchondral edema Mild patellofemoral chondrosis Resolution of edema at the nonweightbearing lateral femoral condyle Intact collateral and cruciate ligaments  DIAGNOSTIC RADIOGRAPHS OF RIGHT KNEE 4+ VIEWS performed on 07/07/2021 RIGHT knee with very minimal medial compartment narrowing on the PA flexed view Mild medial femoral condyle chondral changes as compared to the LEFT knee Patella shows good central tracking  LEFT HEART CATHETERIZATION AND CORONARY ANGIOGRAPHY performed on 05/05/2021 LVEF normal at 55 to 65% Left ventricular systolic function normal LVEDP normal Multivessel CAD 50% stenosis of the mid LAD 50% stenosis of the first diagonal 50% stenosis of the proximal to mid RCA Recommendations are for medical management; intervention deferred.    MYOCARDIAL PERFUSION IMAGING STUDY (LEXISCAN) performed on 12/18/2014 LVEF 50% Left ventricular cavity size  enlarged Normal myocardial thickening and wall motion No artifacts noted No evidence of stress-induced myocardial ischemia or arrhythmia The overall quality of the study is good  TRANSTHORACIC ECHOCARDIOGRAM performed on 12/18/2014 Normal left ventricular systolic function with an EF >55% Normal right ventricular systolic function Moderate mitral and tricuspid valve regurgitation Mild pulmonary valve regurgitation No valvular stenosis No pericardial effusion  Impression and Plan:  Alex Brandt has been referred for pre-anesthesia review and clearance prior to him undergoing the planned anesthetic and procedural courses. Available labs, pertinent testing, and imaging results were personally reviewed by me. This patient has been appropriately cleared by cardiology with an overall ACCEPTABLE risk of significant perioperative cardiovascular complications.  Based on clinical review performed today (08/27/21), barring any significant acute changes in the patient's overall condition, it is anticipated that he will be able to proceed  with the planned surgical intervention. Any acute changes in clinical condition may necessitate his procedure being postponed and/or cancelled. Patient will meet with anesthesia team (MD and/or CRNA) on the day of his procedure for preoperative evaluation/assessment. Questions regarding anesthetic course will be fielded at that time.   Pre-surgical instructions were reviewed with the patient during his PAT appointment and questions were fielded by PAT clinical staff. Patient was advised that if any questions or concerns arise prior to his procedure then he should return a call to PAT and/or his surgeon's office to discuss.  Quentin Mulling, MSN, APRN, FNP-C, CEN [facility]  Peri-operative Services Nurse Practitioner Phone: [phone] Fax: [phone] 08/27/21 11:25 PM  NOTE: This note has been prepared using Dragon dictation software.  Despite my best ability to proofread, there is always the potential that unintentional transcriptional errors may still occur from this process.

## 2021-08-30 ENCOUNTER — Encounter
Admission: RE | Disposition: A | Discharge status: Home / Self Care | Payer: Self-pay | Source: Ambulatory Visit | Attending: Orthopedic Surgery

## 2021-08-30 ENCOUNTER — Other Ambulatory Visit: Payer: Self-pay

## 2021-08-30 ENCOUNTER — Ambulatory Visit
Admission: RE | Admit: 2021-08-30 | Discharge: 2021-08-30 | Disposition: A | Discharge status: Home / Self Care | Payer: Medicaid Other | Source: Ambulatory Visit | Attending: Orthopedic Surgery | Admitting: Orthopedic Surgery

## 2021-08-30 ENCOUNTER — Encounter: Payer: Self-pay | Admitting: Orthopedic Surgery

## 2021-08-30 ENCOUNTER — Ambulatory Visit: Payer: Medicaid Other | Admitting: Urgent Care

## 2021-08-30 DIAGNOSIS — E875 Hyperkalemia: Secondary | ICD-10-CM | POA: Diagnosis not present

## 2021-08-30 DIAGNOSIS — E785 Hyperlipidemia, unspecified: Secondary | ICD-10-CM | POA: Diagnosis not present

## 2021-08-30 DIAGNOSIS — I2510 Atherosclerotic heart disease of native coronary artery without angina pectoris: Secondary | ICD-10-CM | POA: Insufficient documentation

## 2021-08-30 DIAGNOSIS — F17200 Nicotine dependence, unspecified, uncomplicated: Secondary | ICD-10-CM | POA: Insufficient documentation

## 2021-08-30 DIAGNOSIS — J329 Chronic sinusitis, unspecified: Secondary | ICD-10-CM | POA: Insufficient documentation

## 2021-08-30 DIAGNOSIS — G43909 Migraine, unspecified, not intractable, without status migrainosus: Secondary | ICD-10-CM | POA: Insufficient documentation

## 2021-08-30 DIAGNOSIS — X58XXXA Exposure to other specified factors, initial encounter: Secondary | ICD-10-CM | POA: Insufficient documentation

## 2021-08-30 DIAGNOSIS — M179 Osteoarthritis of knee, unspecified: Secondary | ICD-10-CM | POA: Diagnosis not present

## 2021-08-30 DIAGNOSIS — I10 Essential (primary) hypertension: Secondary | ICD-10-CM | POA: Insufficient documentation

## 2021-08-30 DIAGNOSIS — K219 Gastro-esophageal reflux disease without esophagitis: Secondary | ICD-10-CM | POA: Insufficient documentation

## 2021-08-30 DIAGNOSIS — M1990 Unspecified osteoarthritis, unspecified site: Secondary | ICD-10-CM | POA: Insufficient documentation

## 2021-08-30 DIAGNOSIS — S83231A Complex tear of medial meniscus, current injury, right knee, initial encounter: Secondary | ICD-10-CM | POA: Insufficient documentation

## 2021-08-30 HISTORY — PX: KNEE ARTHROSCOPY WITH MEDIAL MENISECTOMY: SHX5651

## 2021-08-30 HISTORY — DX: Dyspnea, unspecified: R06.00

## 2021-08-30 HISTORY — DX: Other forms of angina pectoris: I20.8

## 2021-08-30 HISTORY — DX: Other forms of angina pectoris: I20.89

## 2021-08-30 HISTORY — DX: Migraine, unspecified, not intractable, without status migrainosus: G43.909

## 2021-08-30 HISTORY — DX: Osteoarthritis of knee, unspecified: M17.9

## 2021-08-30 HISTORY — DX: Hyperlipidemia, unspecified: E78.5

## 2021-08-30 SURGERY — ARTHROSCOPY, KNEE, WITH MEDIAL MENISCECTOMY
Anesthesia: General | Site: Knee | Laterality: Right

## 2021-08-30 MED ORDER — CEFAZOLIN SODIUM-DEXTROSE 2-4 GM/100ML-% IV SOLN
2.0000 g | INTRAVENOUS | Status: AC
  Administered 2021-08-30: 2 g via INTRAVENOUS

## 2021-08-30 MED ORDER — DEXMEDETOMIDINE (PRECEDEX) IN NS 20 MCG/5ML (4 MCG/ML) IV SYRINGE
PREFILLED_SYRINGE | INTRAVENOUS | Status: AC
  Filled 2021-08-30: qty 5

## 2021-08-30 MED ORDER — PHENYLEPHRINE HCL (PRESSORS) 10 MG/ML IV SOLN
INTRAVENOUS | Status: DC | PRN
Start: 2021-08-30 — End: 2021-08-30
  Administered 2021-08-30: 80 ug via INTRAVENOUS
  Administered 2021-08-30: 160 ug via INTRAVENOUS

## 2021-08-30 MED ORDER — DEXAMETHASONE SODIUM PHOSPHATE 10 MG/ML IJ SOLN
INTRAMUSCULAR | Status: AC
  Filled 2021-08-30: qty 1

## 2021-08-30 MED ORDER — ONDANSETRON 4 MG PO TBDP: 4.0000 mg | ORAL_TABLET | Freq: Three times a day (TID) | ORAL | 0 refills | Status: DC | PRN

## 2021-08-30 MED ORDER — ONDANSETRON HCL 4 MG/2ML IJ SOLN
INTRAMUSCULAR | Status: AC
  Filled 2021-08-30: qty 2

## 2021-08-30 MED ORDER — HYDROCODONE-ACETAMINOPHEN 5-325 MG PO TABS
ORAL_TABLET | ORAL | Status: AC
  Filled 2021-08-30: qty 2

## 2021-08-30 MED ORDER — FENTANYL CITRATE (PF) 100 MCG/2ML IJ SOLN
INTRAMUSCULAR | Status: AC
  Administered 2021-08-30: 50 ug via INTRAVENOUS
  Filled 2021-08-30: qty 2

## 2021-08-30 MED ORDER — HYDROCODONE-ACETAMINOPHEN 5-325 MG PO TABS: 1.0000 | ORAL_TABLET | ORAL | 0 refills | Status: DC | PRN

## 2021-08-30 MED ORDER — MIDAZOLAM HCL 2 MG/2ML IJ SOLN
INTRAMUSCULAR | Status: DC | PRN
  Administered 2021-08-30: 2 mg via INTRAVENOUS

## 2021-08-30 MED ORDER — LIDOCAINE-EPINEPHRINE 1 %-1:100000 IJ SOLN
INTRAMUSCULAR | Status: DC | PRN
  Administered 2021-08-30: 2 mL
  Administered 2021-08-30: 15 mL

## 2021-08-30 MED ORDER — LACTATED RINGERS IV SOLN
INTRAVENOUS | Status: DC | PRN
  Administered 2021-08-30: 3001 mL

## 2021-08-30 MED ORDER — ACETAMINOPHEN 10 MG/ML IV SOLN: 1000.0000 mg | Freq: Once | INTRAVENOUS | Status: DC | PRN

## 2021-08-30 MED ORDER — LIDOCAINE HCL (PF) 2 % IJ SOLN
INTRAMUSCULAR | Status: AC
  Filled 2021-08-30: qty 5

## 2021-08-30 MED ORDER — IBUPROFEN 800 MG PO TABS: 800.0000 mg | ORAL_TABLET | Freq: Three times a day (TID) | ORAL | 1 refills | Status: AC

## 2021-08-30 MED ORDER — CHLORHEXIDINE GLUCONATE 0.12 % MT SOLN: 15.0000 mL | Freq: Once | OROMUCOSAL | Status: AC

## 2021-08-30 MED ORDER — CEFAZOLIN SODIUM-DEXTROSE 2-4 GM/100ML-% IV SOLN
INTRAVENOUS | Status: AC
  Filled 2021-08-30: qty 100

## 2021-08-30 MED ORDER — FENTANYL CITRATE (PF) 100 MCG/2ML IJ SOLN
INTRAMUSCULAR | Status: DC | PRN
  Administered 2021-08-30: 100 ug via INTRAVENOUS

## 2021-08-30 MED ORDER — FENTANYL CITRATE (PF) 100 MCG/2ML IJ SOLN
INTRAMUSCULAR | Status: AC
  Filled 2021-08-30: qty 2

## 2021-08-30 MED ORDER — ONDANSETRON HCL 4 MG/2ML IJ SOLN: 4.0000 mg | Freq: Once | INTRAMUSCULAR | Status: DC | PRN

## 2021-08-30 MED ORDER — ONDANSETRON HCL 4 MG/2ML IJ SOLN
INTRAMUSCULAR | Status: DC | PRN
  Administered 2021-08-30: 4 mg via INTRAVENOUS

## 2021-08-30 MED ORDER — SUGAMMADEX SODIUM 200 MG/2ML IV SOLN
INTRAVENOUS | Status: DC | PRN
  Administered 2021-08-30: 200 mg via INTRAVENOUS

## 2021-08-30 MED ORDER — EPINEPHRINE PF 1 MG/ML IJ SOLN
INTRAMUSCULAR | Status: AC
  Filled 2021-08-30: qty 1

## 2021-08-30 MED ORDER — MIDAZOLAM HCL 2 MG/2ML IJ SOLN
INTRAMUSCULAR | Status: AC
  Filled 2021-08-30: qty 2

## 2021-08-30 MED ORDER — LACTATED RINGERS IR SOLN
Status: DC | PRN
  Administered 2021-08-30 (×3): 3000 mL

## 2021-08-30 MED ORDER — LACTATED RINGERS IV SOLN: INTRAVENOUS | Status: DC

## 2021-08-30 MED ORDER — HYDROCODONE-ACETAMINOPHEN 5-325 MG PO TABS
2.0000 | ORAL_TABLET | Freq: Four times a day (QID) | ORAL | Status: DC | PRN
  Administered 2021-08-30: 2 via ORAL

## 2021-08-30 MED ORDER — LIDOCAINE-EPINEPHRINE 1 %-1:100000 IJ SOLN
INTRAMUSCULAR | Status: AC
  Filled 2021-08-30: qty 1

## 2021-08-30 MED ORDER — CHLORHEXIDINE GLUCONATE 0.12 % MT SOLN
OROMUCOSAL | Status: AC
  Administered 2021-08-30: 15 mL via OROMUCOSAL
  Filled 2021-08-30: qty 15

## 2021-08-30 MED ORDER — BUPIVACAINE HCL (PF) 0.5 % IJ SOLN
INTRAMUSCULAR | Status: AC
  Filled 2021-08-30: qty 30

## 2021-08-30 MED ORDER — FENTANYL CITRATE (PF) 100 MCG/2ML IJ SOLN
25.0000 ug | INTRAMUSCULAR | Status: DC | PRN
  Administered 2021-08-30 (×2): 25 ug via INTRAVENOUS

## 2021-08-30 MED ORDER — ASPIRIN EC 325 MG PO TBEC: 325.0000 mg | DELAYED_RELEASE_TABLET | Freq: Every day | ORAL | 0 refills | Status: AC

## 2021-08-30 MED ORDER — PROPOFOL 10 MG/ML IV BOLUS
INTRAVENOUS | Status: DC | PRN
  Administered 2021-08-30: 200 mg via INTRAVENOUS

## 2021-08-30 MED ORDER — DEXAMETHASONE SODIUM PHOSPHATE 10 MG/ML IJ SOLN
INTRAMUSCULAR | Status: DC | PRN
  Administered 2021-08-30: 10 mg via INTRAVENOUS

## 2021-08-30 MED ORDER — ORAL CARE MOUTH RINSE: 15.0000 mL | Freq: Once | OROMUCOSAL | Status: AC

## 2021-08-30 MED ORDER — ROCURONIUM BROMIDE 100 MG/10ML IV SOLN
INTRAVENOUS | Status: DC | PRN
Start: 2021-08-30 — End: 2021-08-30
  Administered 2021-08-30: 40 mg via INTRAVENOUS

## 2021-08-30 MED ORDER — LIDOCAINE HCL (CARDIAC) PF 100 MG/5ML IV SOSY
PREFILLED_SYRINGE | INTRAVENOUS | Status: DC | PRN
  Administered 2021-08-30: 100 mg via INTRAVENOUS

## 2021-08-30 MED ORDER — PROPOFOL 10 MG/ML IV BOLUS
INTRAVENOUS | Status: AC
  Filled 2021-08-30: qty 20

## 2021-08-30 MED ORDER — DEXMEDETOMIDINE (PRECEDEX) IN NS 20 MCG/5ML (4 MCG/ML) IV SYRINGE
PREFILLED_SYRINGE | INTRAVENOUS | Status: DC | PRN
  Administered 2021-08-30: 12 ug via INTRAVENOUS

## 2021-08-30 MED ORDER — ACETAMINOPHEN 500 MG PO TABS: 1000.0000 mg | ORAL_TABLET | Freq: Three times a day (TID) | ORAL | 2 refills | Status: DC

## 2021-08-30 MED ORDER — ROCURONIUM BROMIDE 10 MG/ML (PF) SYRINGE
PREFILLED_SYRINGE | INTRAVENOUS | Status: AC
  Filled 2021-08-30: qty 10

## 2021-08-30 SURGICAL SUPPLY — 50 items
ADAPTER IRRIG TUBE 2 SPIKE SOL (ADAPTER) IMPLANT
ADPR TBG 2 SPK PMP STRL ASCP (ADAPTER)
APL PRP STRL LF DISP 70% ISPRP (MISCELLANEOUS) ×1
BLADE FULL RADIUS 3.5 (BLADE) IMPLANT
BLADE SHAVER 4.5X7 STR FR (MISCELLANEOUS) ×2 IMPLANT
BLADE SURG SZ11 CARB STEEL (BLADE) ×2 IMPLANT
BNDG COHESIVE 6X5 TAN ST LF (GAUZE/BANDAGES/DRESSINGS) IMPLANT
BNDG ELASTIC 6X5.8 VLCR STR LF (GAUZE/BANDAGES/DRESSINGS) IMPLANT
BNDG ESMARK 6X12 TAN STRL LF (GAUZE/BANDAGES/DRESSINGS) ×2 IMPLANT
BUR BR 5.5 WIDE MOUTH (BURR) IMPLANT
CAST PADDING 6X4YD ST 30248 (SOFTGOODS)
CHLORAPREP W/TINT 26 (MISCELLANEOUS) ×2 IMPLANT
COOLER POLAR GLACIER W/PUMP (MISCELLANEOUS) ×2 IMPLANT
CUFF TOURN SGL QUICK 24 (TOURNIQUET CUFF)
CUFF TOURN SGL QUICK 34 (TOURNIQUET CUFF)
CUFF TRNQT CYL 24X4X16.5-23 (TOURNIQUET CUFF) IMPLANT
CUFF TRNQT CYL 34X4.125X (TOURNIQUET CUFF) IMPLANT
DEVICE SUCT BLK HOLE OR FLOOR (MISCELLANEOUS) ×2 IMPLANT
DRAPE ARTHRO LIMB 89X125 STRL (DRAPES) ×4 IMPLANT
DRAPE IMP U-DRAPE 54X76 (DRAPES) ×2 IMPLANT
ELECT REM PT RETURN 9FT ADLT (ELECTROSURGICAL)
ELECTRODE REM PT RTRN 9FT ADLT (ELECTROSURGICAL) IMPLANT
GAUZE SPONGE 4X4 12PLY STRL (GAUZE/BANDAGES/DRESSINGS) ×2 IMPLANT
GLOVE SRG 8 PF TXTR STRL LF DI (GLOVE) ×1 IMPLANT
GLOVE SURG ORTHO LTX SZ8 (GLOVE) ×4 IMPLANT
GLOVE SURG UNDER POLY LF SZ8 (GLOVE) ×2
GOWN STRL REUS W/ TWL LRG LVL3 (GOWN DISPOSABLE) ×1 IMPLANT
GOWN STRL REUS W/ TWL XL LVL3 (GOWN DISPOSABLE) ×1 IMPLANT
GOWN STRL REUS W/TWL LRG LVL3 (GOWN DISPOSABLE) ×2
GOWN STRL REUS W/TWL XL LVL3 (GOWN DISPOSABLE) ×2
IV LACTATED RINGER IRRG 3000ML (IV SOLUTION) ×4
IV LR IRRIG 3000ML ARTHROMATIC (IV SOLUTION) ×2 IMPLANT
KIT TURNOVER KIT A (KITS) ×2 IMPLANT
MANIFOLD NEPTUNE II (INSTRUMENTS) ×4 IMPLANT
MAT ABSORB  FLUID 56X50 GRAY (MISCELLANEOUS) ×2
MAT ABSORB FLUID 56X50 GRAY (MISCELLANEOUS) ×2 IMPLANT
PACK ARTHROSCOPY KNEE (MISCELLANEOUS) ×2 IMPLANT
PAD ABD DERMACEA PRESS 5X9 (GAUZE/BANDAGES/DRESSINGS) ×4 IMPLANT
PAD WRAPON POLAR KNEE (MISCELLANEOUS) ×1 IMPLANT
PADDING CAST COTTON 6X4 ST (SOFTGOODS) IMPLANT
PENCIL ELECTRO HAND CTR (MISCELLANEOUS) IMPLANT
SPONGE T-LAP 18X18 ~~LOC~~+RFID (SPONGE) ×2 IMPLANT
SUT ETHILON 3-0 FS-10 30 BLK (SUTURE) ×2
SUTURE EHLN 3-0 FS-10 30 BLK (SUTURE) ×1 IMPLANT
TOWEL OR 17X26 4PK STRL BLUE (TOWEL DISPOSABLE) ×4 IMPLANT
TUBING INFLOW SET DBFLO PUMP (TUBING) ×2 IMPLANT
TUBING OUTFLOW SET DBLFO PUMP (TUBING) ×2 IMPLANT
WAND WEREWOLF FLOW 90D (MISCELLANEOUS) IMPLANT
WATER STERILE IRR 500ML POUR (IV SOLUTION) IMPLANT
WRAPON POLAR PAD KNEE (MISCELLANEOUS) ×2

## 2021-08-30 NOTE — Anesthesia Preprocedure Evaluation (Signed)
Anesthesia Evaluation  Patient identified by MRN, date of birth, ID band Patient awake    Reviewed: Allergy & Precautions, NPO status , Patient's Chart, lab work & pertinent test results  History of Anesthesia Complications Negative for: history of anesthetic complications  Airway Mallampati: II  TM Distance: >3 FB Neck ROM: Full    Dental  (+) Poor Dentition, Missing, Chipped   Pulmonary neg sleep apnea, neg COPD, Current SmokerPatient did not abstain from smoking.,  Patient has some non-specific isolated sinusitis/post-nasal drip today with associated throat clearing. Denies productive cough, fevers, SOB, purulent mucus.    Pulmonary exam normal breath sounds clear to auscultation       Cardiovascular Exercise Tolerance: Good METShypertension, + angina + CAD  (-) Past MI (-) dysrhythmias  Rhythm:Regular Rate:Normal - Systolic murmurs Left heart cath: Normal overall left ventricular function EF of 60% Coronaries Left main Short free of disease LAD mild mid lesion of around 25 to 50% Diagonal 1 ostial lesion around 50% RCA large 25 to 50% diffuse mid lesion mild ectasia    Neuro/Psych  Headaches, negative psych ROS   GI/Hepatic GERD  Medicated and Controlled,(+)     (-) substance abuse  ,   Endo/Other  neg diabetes  Renal/GU negative Renal ROS     Musculoskeletal  (+) Arthritis ,   Abdominal   Peds  Hematology   Anesthesia Other Findings Past Medical History: No date: Angina at rest [facility]) No date: Arthritis 05/05/2021: Coronary artery disease     Comment:  a.) LHC 05/05/2021: EF 55-65%; normal LV function and               LVEDP; 50% mLAD, 50% D1, 50% p-mRCA; med mgmt. No date: DJD (degenerative joint disease) of knee No date: Dyspnea No date: GERD (gastroesophageal reflux disease) No date: HLD (hyperlipidemia) No date: Hyperkalemia No date: Hypertension No date: Migraines 12/18/2014: Valvular  insufficiency     Comment:  a.) TTE 12/18/2014: EF >55%; mild PR, moderate MR/TR.  Reproductive/Obstetrics                             Anesthesia Physical Anesthesia Plan  ASA: 2  Anesthesia Plan: General   Post-op Pain Management: Ketamine IV, Toradol IV (intra-op), Ofirmev IV (intra-op) and Regional block   Induction: Intravenous  PONV Risk Score and Plan: 2 and Ondansetron, Dexamethasone and Midazolam  Airway Management Planned: Oral ETT  Additional Equipment: None  Intra-op Plan:   Post-operative Plan: Extubation in OR  Informed Consent: I have reviewed the patients History and Physical, chart, labs and discussed the procedure including the risks, benefits and alternatives for the proposed anesthesia with the patient or authorized representative who has indicated his/her understanding and acceptance.     Dental advisory given  Plan Discussed with: CRNA and Surgeon  Anesthesia Plan Comments: (Discussed risks/benefits of proceeding with surgery in light of patient's sinusitis. Patient wishes to proceed understanding the potential increased airway risks, and I think it is reasonable, given that this congestion is isolated and does not accompany other worrisome symptoms.  Discussed risks of anesthesia with patient, including PONV, sore throat, lip/dental/eye damage. Rare risks discussed as well, such as cardiorespiratory and neurological sequelae, and allergic reactions. Discussed the role of CRNA in patient's perioperative care. Patient understands. Patient counseled on benefits of smoking cessation, and increased perioperative risks associated with continued smoking. Patient had adductor canal block + nerve catheter after his previous knee scope,  and he did not have a good experience with it. Discussed r/b/a of potential post-op adductor canal nerve block, including:  - bleeding, infection, nerve damage - poor or non functioning block. Patient  understands. )       Anesthesia Quick Evaluation

## 2021-08-30 NOTE — Anesthesia Postprocedure Evaluation (Signed)
Anesthesia Post Note  Patient: Alex Brandt  Procedure(s) Performed: Right knee arthroscopic partial medial meniscectomy (Right: Knee)  Patient location during evaluation: PACU Anesthesia Type: General Level of consciousness: awake and alert, awake and oriented Pain management: pain level controlled Vital Signs Assessment: post-procedure vital signs reviewed and stable Respiratory status: spontaneous breathing, nonlabored ventilation and respiratory function stable Cardiovascular status: blood pressure returned to baseline and stable Postop Assessment: no apparent nausea or vomiting Anesthetic complications: no   No notable events documented.   Last Vitals:  Vitals:   08/30/21 1315 08/30/21 1330  BP: 119/73 115/82  Pulse: 72 68  Resp: 12 16  Temp:  (!) 36.4 C  SpO2: 100% 100%    Last Pain:  Vitals:   08/30/21 1330  TempSrc:   PainSc: 6                  Manfred Arch

## 2021-08-30 NOTE — Discharge Instructions (Addendum)
Arthroscopic Knee Surgery - Partial Meniscectomy   Post-Op Instructions   1. Bracing or crutches: Crutches will be provided at the time of discharge from the surgery center if you do not already have them.   2. Ice: You may be provided with a device [facility]) that allows you to ice the affected area effectively. Otherwise you can ice manually.    3. Driving:  Plan on not driving for at least two weeks. Please note that you are advised NOT to drive while taking narcotic pain medications as you may be impaired and unsafe to drive.   4. Activity: Ankle pumps several times an hour while awake to prevent blood clots. Weight bearing: as tolerated. Use crutches for as needed (usually ~1 week or less) until pain allows you to ambulate without a limp. Bending and straightening the knee is unlimited. Elevate knee above heart level as much as possible for one week. Avoid standing more than 5 minutes (consecutively) for the first week.  Avoid long distance travel for 2 weeks.  5. Medications:  - You have been provided a prescription for narcotic pain medicine. After surgery, take 1-2 narcotic tablets every 4 hours if needed for severe pain.  - You may take up to 3000mg /day of tylenol (acetaminophen). You can take 1000mg  3x/day. Please check your narcotic. If you have acetaminophen in your narcotic (each tablet will be 325mg ), be careful not to exceed a total of 3000mg /day of acetaminophen.  - A prescription for anti-nausea medication will be provided in case the narcotic medicine or anesthesia causes nausea - take 1 tablet every 6 hours only if nauseated.  - Take ibuprofen 800 mg every 8 hours WITH food to reduce post-operative knee swelling. DO NOT STOP IBUPROFEN POST-OP UNTIL INSTRUCTED TO DO SO at first post-op office visit (10-14 days after surgery). However, please discontinue if you have any abdominal discomfort after taking this.  - Take enteric coated aspirin 325 mg once daily for 2 weeks to prevent  blood clots.    6. Bandages: The physical therapist should change the bandages at the first post-op appointment. If needed, the dressing supplies have been provided to you.   7. Physical Therapy: 1-2 times per week for 6 weeks. Therapy typically starts on post operative Day 3 or 4. You have been provided an order for physical therapy. The therapist will provide home exercises.   8. Work: May return to full work usually around 2 weeks after 1st post-operative visit. May do light duty/desk job in approximately 1-2 weeks when off of narcotics, pain is well-controlled, and swelling has decreased. Labor intensive jobs may require 4-6 weeks to return.      9. Post-Op Appointments: Your first post-op appointment will be with Dr. in approximately 2 weeks time.    If you find that they have not been scheduled please call the Orthopaedic Appointment front desk at [phone]. AMBULATORY SURGERY  DISCHARGE INSTRUCTIONS   The drugs that you were given will stay in your system until tomorrow so for the next 24 hours you should not:  Drive an automobile Make any legal decisions Drink any alcoholic beverage   You may resume regular meals tomorrow.  Today it is better to start with liquids and gradually work up to solid foods.  You may eat anything you prefer, but it is better to start with liquids, then soup and crackers, and gradually work up to solid foods.   Please notify your doctor immediately if you have any unusual  bleeding, trouble breathing, redness and pain at the surgery site, drainage, fever, or pain not relieved by medication.    Your post-operative visit with Dr.                                       is: Date:                        Time:    Please call to schedule your post-operative visit.  Additional Instructions:

## 2021-08-30 NOTE — Op Note (Addendum)
Operative Note    SURGERY DATE: 08/30/2021   PRE-OP DIAGNOSIS:  1. Right medial meniscus re-tear   POST-OP DIAGNOSIS:  1. Right medial meniscus re-tear   PROCEDURES:  1. Right knee arthroscopy, partial medial meniscectomy   SURGEON: Rosealee Albee, MD   ANESTHESIA: Gen   ESTIMATED BLOOD LOSS: minimal   TOTAL IV FLUIDS: per anesthesia   INDICATION(S):  Alex Brandt is a 45 y.o. male with signs and symptoms as well as MRI finding of recurrent medial meniscus tear.  He had undergone a right knee arthroscopic medial meniscus repair on 05/27/2020 at [facility], but has had persistent pain since this time.  After discussion of risks, benefits, and alternatives to surgery, the patient elected to proceed.   OPERATIVE FINDINGS:    Examination under anesthesia: A careful examination under anesthesia was performed.  Passive range of motion was: Hyperextension: 2.  Extension: 0.  Flexion: 130.  Lachman: normal. Pivot Shift: normal.  Posterior drawer: normal.  Varus stability in full extension: normal.  Varus stability in 30 degrees of flexion: normal.  Valgus stability in full extension: normal.  Valgus stability in 30 degrees of flexion: normal.   Intra-operative findings: A thorough arthroscopic examination of the knee was performed.  The findings are: 1. Suprapatellar pouch: Normal 2. Undersurface of median ridge: Grade 1 softening 3. Medial patellar facet: Grade 1 softening 4. Lateral patellar facet: Grade 1 softening 5. Trochlea: Grade 1 degenerative changes 6. Lateral gutter/popliteus tendon: Normal 7. Hoffa's fat pad: Inflamed 8. Medial gutter/plica: Normal 9. ACL: Normal 10. PCL: Normal 11. Medial meniscus: Complex tear with a combination of a radial and horizontal tear at the posterior horn/body junction.  Tear involves the entire width of the meniscus.  Prior repair sutures x3 visualized.  12. Medial compartment cartilage: Grade 1 degenerative changes to the medial femoral condyle  and tibial plateau 13. Lateral meniscus: Normal 14. Lateral compartment cartilage: Normal   OPERATIVE REPORT:     I identified Alex Brandt in the pre-operative holding area. I marked the operative knee with my initials. I reviewed the risks and benefits of the proposed surgical intervention and the patient wished to proceed. The patient was transferred to the operative suite and placed in the supine position with all bony prominences padded.  Anesthesia was administered. Appropriate IV antibiotics were administered prior to incision. The extremity was then prepped and draped in standard fashion. A time out was performed confirming the correct extremity, correct patient, and correct procedure.   Arthroscopy portals were marked. Local anesthetic was injected to the planned portal sites. The anterolateral portal was established with an 11 blade.      The arthroscope was placed in the anterolateral portal and then into the suprapatellar pouch. Next, the medial portal was established under needle localization. A diagnostic knee scope was completed with the above findings. The medial meniscus tear was identified.   The MCL was pie-crusted to improve visualization of the posterior horn. The meniscal tear deemed a repairable given the complexity of the tear as well as the degenerative edges.  Therefore, it was debrided using an arthroscopic biter and an oscillating shaver until the meniscus had stable borders.  Previous repair sutures were also removed using an arthroscopic grasper. Arthroscopic fluid was removed from the joint.   The portals were closed with 3-0 Nylon suture. Sterile dressings included Xeroform, 4x4s, Sof-Rol, and Bias wrap. A Polarcare was placed.  The patient was then awakened and taken to the PACU hemodynamically stable  without complication.     POSTOPERATIVE PLAN: The patient will be discharged home today once they meet PACU criteria. Aspirin 325 mg daily was prescribed for 2  weeks for DVT prophylaxis.  Physical therapy will start on POD#3-4. Weight-bearing as tolerated. Follow up in 2 weeks per protocol.

## 2021-08-30 NOTE — Transfer of Care (Signed)
Immediate Anesthesia Transfer of Care Note  Patient: Alex Brandt  Procedure(s) Performed: Right knee arthroscopic partial medial meniscectomy (Right: Knee)  Patient Location: PACU  Anesthesia Type:General  Level of Consciousness: awake  Airway & Oxygen Therapy: Patient Spontanous Breathing and Patient connected to face mask oxygen  Post-op Assessment: Report given to RN and Post -op Vital signs reviewed and stable  Post vital signs: Reviewed and stable  Last Vitals:  Vitals Value Taken Time  BP 113/71   Temp 97.24f   Pulse 73 08/30/21 1245  Resp 8 08/30/21 1245  SpO2 100 % 08/30/21 1245  Vitals shown include unvalidated device data.  Last Pain:  Vitals:   08/30/21 0947  TempSrc: Temporal  PainSc: 5          Complications: No notable events documented.

## 2021-08-30 NOTE — H&P (Signed)
Paper H&P to be scanned into permanent record. H&P reviewed. No significant changes noted.  

## 2021-08-30 NOTE — Anesthesia Procedure Notes (Signed)
Procedure Name: Intubation Date/Time: 08/30/2021 11:38 AM Performed by: Irving Burton, CRNA Pre-anesthesia Checklist: Patient identified, Patient being monitored, Timeout performed, Emergency Drugs available and Suction available Patient Re-evaluated:Patient Re-evaluated prior to induction Oxygen Delivery Method: Circle system utilized Preoxygenation: Pre-oxygenation with 100% oxygen Induction Type: IV induction Ventilation: Mask ventilation without difficulty Laryngoscope Size: McGraph and 4 Grade View: Grade I Tube type: Oral Tube size: 7.0 mm Number of attempts: 1 Airway Equipment and Method: Stylet Placement Confirmation: ETT inserted through vocal cords under direct vision, positive ETCO2 and breath sounds checked- equal and bilateral Secured at: 22 cm Tube secured with: Tape Dental Injury: Teeth and Oropharynx as per pre-operative assessment

## 2021-08-31 ENCOUNTER — Encounter: Payer: Self-pay | Admitting: Orthopedic Surgery

## 2021-10-19 ENCOUNTER — Other Ambulatory Visit (HOSPITAL_COMMUNITY): Payer: Self-pay | Admitting: Orthopedic Surgery

## 2021-10-19 ENCOUNTER — Other Ambulatory Visit: Payer: Self-pay | Admitting: Orthopedic Surgery

## 2021-10-19 DIAGNOSIS — G8929 Other chronic pain: Secondary | ICD-10-CM

## 2021-10-21 ENCOUNTER — Ambulatory Visit
Admission: RE | Admit: 2021-10-21 | Discharge: 2021-10-21 | Disposition: A | Discharge status: Home / Self Care | Payer: Medicaid Other | Source: Ambulatory Visit | Attending: Orthopedic Surgery | Admitting: Orthopedic Surgery

## 2021-10-21 DIAGNOSIS — M25561 Pain in right knee: Secondary | ICD-10-CM | POA: Insufficient documentation

## 2021-10-21 DIAGNOSIS — G8929 Other chronic pain: Secondary | ICD-10-CM | POA: Insufficient documentation

## 2022-01-27 ENCOUNTER — Other Ambulatory Visit: Payer: Self-pay | Admitting: Surgery

## 2022-02-01 ENCOUNTER — Encounter
Admission: RE | Admit: 2022-02-01 | Discharge: 2022-02-01 | Disposition: A | Discharge status: Home / Self Care | Payer: Medicaid Other | Source: Ambulatory Visit | Attending: Surgery | Admitting: Surgery

## 2022-02-01 VITALS — BP 126/82 | HR 66 | Temp 98.2°F | Resp 16 | Ht 76.0 in | Wt 212.0 lb

## 2022-02-01 DIAGNOSIS — Z01812 Encounter for preprocedural laboratory examination: Secondary | ICD-10-CM

## 2022-02-01 DIAGNOSIS — Z01818 Encounter for other preprocedural examination: Secondary | ICD-10-CM | POA: Diagnosis present

## 2022-02-01 LAB — COMPREHENSIVE METABOLIC PANEL
ALT: 17 U/L (ref 0–44)
AST: 19 U/L (ref 15–41)
Albumin: 3.9 g/dL (ref 3.5–5.0)
Alkaline Phosphatase: 80 U/L (ref 38–126)
Anion gap: 6 (ref 5–15)
BUN: 16 mg/dL (ref 6–20)
CO2: 26 mmol/L (ref 22–32)
Calcium: 8.9 mg/dL (ref 8.9–10.3)
Chloride: 107 mmol/L (ref 98–111)
Creatinine, Ser: 1.21 mg/dL (ref 0.61–1.24)
GFR, Estimated: >60 mL/min (ref >60)
Glucose, Bld: 96 mg/dL (ref 70–99)
Potassium: 4.1 mmol/L (ref 3.5–5.1)
Sodium: 139 mmol/L (ref 135–145)
Total Bilirubin: 0.8 mg/dL (ref 0.3–1.2)
Total Protein: 7.5 g/dL (ref 6.5–8.1)

## 2022-02-01 LAB — CBC WITH DIFFERENTIAL/PLATELET
Abs Immature Granulocytes: 0.03 10*3/uL (ref 0.00–0.07)
Basophils Absolute: 0.1 10*3/uL (ref 0.0–0.1)
Basophils Relative: 1 %
Eosinophils Absolute: 0.1 10*3/uL (ref 0.0–0.5)
Eosinophils Relative: 2 %
HCT: 47.4 % (ref 39.0–52.0)
Hemoglobin: 15.8 g/dL (ref 13.0–17.0)
Immature Granulocytes: 0 %
Lymphocytes Relative: 29 %
Lymphs Abs: 2.1 10*3/uL (ref 0.7–4.0)
MCH: 29.4 pg (ref 26.0–34.0)
MCHC: 33.3 g/dL (ref 30.0–36.0)
MCV: 88.3 fL (ref 80.0–100.0)
Monocytes Absolute: 0.4 10*3/uL (ref 0.1–1.0)
Monocytes Relative: 6 %
Neutro Abs: 4.3 10*3/uL (ref 1.7–7.7)
Neutrophils Relative %: 62 %
Platelets: 209 10*3/uL (ref 150–400)
RBC: 5.37 MIL/uL (ref 4.22–5.81)
RDW: 13.2 % (ref 11.5–15.5)
WBC: 7 10*3/uL (ref 4.0–10.5)
nRBC: 0 % (ref 0.0–0.2)

## 2022-02-01 LAB — URINALYSIS, ROUTINE W REFLEX MICROSCOPIC
Bilirubin Urine: NEGATIVE
Glucose, UA: NEGATIVE mg/dL
Hgb urine dipstick: NEGATIVE
Ketones, ur: NEGATIVE mg/dL
Leukocytes,Ua: NEGATIVE
Nitrite: NEGATIVE
Protein, ur: NEGATIVE mg/dL
Specific Gravity, Urine: 1.026 (ref 1.005–1.030)
pH: 5 (ref 5.0–8.0)

## 2022-02-01 LAB — SURGICAL PCR SCREEN
MRSA, PCR: NEGATIVE
Staphylococcus aureus: NEGATIVE

## 2022-02-01 LAB — TYPE AND SCREEN
ABO/RH(D): O POS
Antibody Screen: NEGATIVE

## 2022-02-01 NOTE — Patient Instructions (Addendum)
?Your procedure is scheduled on: Tuesday Feb 15, 2022. ?Report to Day Surgery inside [facility] 2nd floors, stop by admissions desk before getting on elevator. ?To find out your arrival time please call [phone] between 1PM - 3PM on Monday Feb 14, 2022. ? ?Remember: Instructions that are not followed completely may result in serious medical risk,  ?up to and including death, or upon the discretion of your surgeon and anesthesiologist your  ?surgery may need to be rescheduled.  ? ?  _X__ 1. Do not eat food after midnight the night before your procedure. ?                No chewing gum or hard candies. You may drink clear liquids up to 2 hours ?                before you are scheduled to arrive for your surgery- DO not drink clear ?                liquids within 2 hours of the start of your surgery. ?                Clear Liquids include:  water, apple juice without pulp, clear Gatorade, G2 or  ?                Gatorade Zero (avoid Red/Purple/Blue), Black Coffee or Tea (Do not add ?                anything to coffee or tea). ? ?___X__2.   Complete the "Ensure Clear Pre-surgery Clear Carbohydrate Drink" provided to you, 2 hours before arrival. **If you       are diabetic you will be provided with an alternative drink, Gatorade Zero or G2. ? ?__X__2.  On the morning of surgery brush your teeth with toothpaste and water, you ?               may rinse your mouth with mouthwash if you wish.  Do not swallow any toothpaste of mouthwash. ?   ? _X__ 3.  No Alcohol for 24 hours before or after surgery. ? ? _X__ 4.  Do Not Smoke or use e-cigarettes For 24 Hours Prior to Your Surgery. ?                Do not use any chewable tobacco products for at least 6 hours prior to ?                Surgery. ? ?_X__  5.  Do not use any recreational drugs (marijuana, cocaine, heroin, ecstasy, MDMA or other) ?               For at least one week prior to your surgery.  Combination of these drugs with anesthesia ?                May have life threatening results. ? ?____  6.  Bring all medications with you on the day of surgery if instructed.  ? ?__X__  7.  Notify your doctor if there is any change in your medical condition  ?    (cold, fever, infections). ?    ?Do not wear jewelry, make-up, hairpins, clips or nail polish. ?Do not wear lotions, powders, or perfumes. You may wear deodorant. ?Do not shave 48 hours prior to surgery. Men may shave face and neck. ?Do not bring valuables to the hospital.   ? ?[facility] is not responsible  for any belongings or valuables. ? ?Contacts, dentures or bridgework may not be worn into surgery. ?Leave your suitcase in the car. After surgery it may be brought to your room. ?For patients admitted to the hospital, discharge time is determined by your ?treatment team. ?  ?Patients discharged the day of surgery will not be allowed to drive home.   ?Make arrangements for someone to be with you for the first 24 hours of your ?Same Day Discharge. ? ? ?__X__ Take these medicines the morning of surgery with A SIP OF WATER:  ? ? 1. omeprazole (PRILOSEC) 20 MG ? 2.  ? 3.  ? 4. ? 5. ? 6. ? ?____ Fleet Enema (as directed)  ? ?__X__ Use CHG Soap (or wipes) as directed ? ?____ Use Benzoyl Peroxide Gel as instructed ? ?____ Use inhalers on the day of surgery ? ?____ Stop metformin 2 days prior to surgery   ? ?____ Take 1/2 of usual insulin dose the night before surgery. No insulin the morning ?         of surgery.  ? ?__X__  Already stopped aspirin 81 mg before your surgery.  ? ?__X__ One Week prior to surgery- Stop Anti-inflammatories such as Ibuprofen, Aleve, Advil, Motrin, meloxicam (MOBIC), diclofenac, etodolac, ketorolac, Toradol, Daypro, piroxicam, Goody's or BC powders. OK TO USE TYLENOL IF NEEDED ?  ?__X__ Do not start any vitamins and or herbal supplements until after surgery.   ? ?____ Bring C-Pap to the hospital.  ? ? ?If you have any questions regarding your pre-procedure instructions,  ?Please  call Pre-admit Testing at [phone] ?

## 2022-02-11 NOTE — Progress Notes (Signed)
?Perioperative Services ? ?Pre-Admission/Anesthesia Testing Clinical Review ? ?Date: 02/11/22 ? ?Patient Demographics:  ?Name: Alex Brandt ?DOB:   Feb 20, 1976 ?MRN:   [id number] ? ?Planned Surgical Procedure(s):  ? ? Case: [id number] Date/Time: 02/15/22 1019  ? Procedure: UNICOMPARTMENTAL KNEE (Right: Knee)  ? Anesthesia type: Choice  ? 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 Location: [facility] OR ROOM 03 / [facility] ORS FOR ANESTHESIA GROUP  ? Surgeons: Christena FlakePoggi, John J, MD  ? ?NOTE: Available PAT nursing documentation and vital signs have been reviewed. Clinical nursing staff has updated patient's PMH/PSHx, current medication list, and drug allergies/intolerances to ensure comprehensive history available to assist in medical decision making as it pertains to the aforementioned surgical procedure and anticipated anesthetic course. Extensive review of available clinical information performed. Lastrup PMH and PSHx updated with any diagnoses/procedures that  may have been inadvertently omitted during his intake with the pre-admission testing department's nursing staff. ? ?Clinical Discussion:  ?Alex Brandt is a 46 y.o. male who is submitted for pre-surgical anesthesia review and clearance prior to him undergoing the above procedure. Patient is a Current Smoker (10 pack years). Pertinent PMH includes: CAD, angina, valvular insufficiency, HTN, HLD, shortness of breath, GERD (on daily PPI), DJD, OA. ? ?Patient is followed by cardiology Juliann Pares([place], MD). He was last seen in the cardiology clinic on 08/19/2021; notes reviewed. At the time of his clinic visit, the patient denied any shortness of breath, PND, orthopnea, palpitations, significant peripheral edema, vertiginous symptoms, or presyncope/syncope.  Patient experiencing episodic angina at rest for which she has NTG to use on a as needed basis; no recent use of this medication.  Patient's main complaint was related to his LEFT knee.   Patient with a PMH significant for cardiovascular diagnoses. ?  ?TTE performed on 12/18/2014 demonstrated an overall normal left ventricular systolic function with an EF of >55%.  There was mild pulmonary and moderate mitral/tricuspid valve regurgitation. There was no transvalvular gradient to suggest significant stenosis. ?  ?Myocardial perfusion imaging study performed on 12/18/2014 demonstrated a normal left ventricular systolic function; LVEF 50%.  Left ventricular cavity was enlarged, however there were no regional wall motion abnormalities.  There was no evidence of stress-induced myocardial ischemia or arrhythmia. ?  ?Patient underwent diagnostic left heart catheterization on 05/05/2021 revealing a normal left ventricular systolic function with an EF of 60%.  There was multivessel nonobstructive CAD; 50% mid LAD, 50% D1, and 50% proximal to mid RCA.  LVEDP normal.  Intervention deferred opting for medical management. ?  ?Blood pressure well controlled at 122/76 on currently prescribed CCB and beta-blocker therapies.  Patient is on a statin for his HLD and further ASCVD prevention. Patient is not diabetic. Functional capacity, as defined by DASI, is documented as being >/= 4 METS.  No changes were made to his medication regimen.  Patient follow-up with outpatient cardiology in 6 months or sooner if needed. ? ?Alex Brandt is scheduled for an elective RIGHT UNICOMPARTMENTAL KNEE on 02/15/2022 with Dr. Leron CroakJohn Poggi, MD.  Given patient's past medical history significant for cardiovascular diagnoses, presurgical cardiac clearance was sought by the PAT team. Per cardiology, "this patient is optimized for surgery and may proceed with the planned procedural course with a LOW risk of significant perioperative cardiovascular complications". In review of his medication reconciliation, it is noted the patient is on daily antiplatelet therapy. He has been instructed on recommendations from his cardiologist for holding  his daily low-dose ASA for 7 days  prior to his procedure with plans to restart as soon as postoperative bleeding risk felt to be minimized by his primary attending surgeon. The patient is aware that his last dose of ASA should be on 02/07/2022. ? ?Patient denies previous perioperative complications with anesthesia in the past. In review of the available records, it is noted that patient underwent a general anesthetic course here at [facility] ([facility] II) in 08/2021 without documented complications.  ? ? ?  02/01/2022  ? 10:02 AM 08/30/2021  ?  1:48 PM 08/30/2021  ?  1:30 PM  ?Vitals with BMI  ?Height 6\' 4"     ?Weight 212 lbs    ?BMI 25.82    ?Systolic 126 123  ?Diastolic 82 72 82  ?Pulse 66 68 68  ? ? ?Providers/Specialists:  ? ?NOTE: Primary physician provider listed below. Patient may have been seen by APP or partner within same practice.  ? ?PROVIDER ROLE / SPECIALTY LAST OV  ?Poggi, 235, MD Orthopedics (Surgeon) 12/15/2021  ?[facility] Primary Care Provider ???  ?[facility], MD Cardiology 08/19/2021  ? ?Allergies:  ?Shellfish allergy, Hydrocodone, and Tramadol ? ?Current Home Medications:  ? ?No current facility-administered medications for this encounter.  ? ? aspirin EC 81 MG tablet  ? diltiazem (CARDIZEM CD) 120 MG 24 hr capsule  ? ibuprofen (ADVIL) 800 MG tablet  ? nitroGLYCERIN (NITROSTAT) 0.4 MG SL tablet  ? omeprazole (PRILOSEC) 20 MG capsule  ? pravastatin (PRAVACHOL) 40 MG tablet  ? ?History:  ? ?Past Medical History:  ?Diagnosis Date  ? Angina at rest [facility])   ? Arthritis   ? Coronary artery disease 05/05/2021  ? a.) LHC 05/05/2021: EF 55-65%; normal LV function and LVEDP; 50% mLAD, 50% D1, 50% p-mRCA; med mgmt.  ? DJD (degenerative joint disease) of knee   ? Dyspnea   ? GERD (gastroesophageal reflux disease)   ? HLD (hyperlipidemia)   ? Hyperkalemia   ? Hypertension   ? Migraines   ? Valvular insufficiency 12/18/2014  ? a.) TTE  12/18/2014: EF >55%; mild PR, moderate MR/TR.  ? ?Past Surgical History:  ?Procedure Laterality Date  ? APPENDECTOMY    ? CARDIAC CATHETERIZATION    ? CHOLECYSTECTOMY    ? COLONOSCOPY    ? ESOPHAGOGASTRODUODENOSCOPY    ? KNEE ARTHROSCOPY WITH MEDIAL MENISECTOMY Right 08/30/2021  ? Procedure: Right knee arthroscopic partial medial meniscectomy;  Surgeon: 09/01/2021, MD;  Location: [facility] ORS;  Service: Orthopedics;  Laterality: Right;  ? KNEE SURGERY Right   ? meniscus repair in [facility] and [facility] at [facility]  ? LEFT HEART CATH AND CORONARY ANGIOGRAPHY Left 05/05/2021  ? Procedure: LEFT HEART CATH AND CORONARY ANGIOGRAPHY;  Surgeon: 05/07/2021, MD;  Location: [facility] INVASIVE CV LAB;  Service: Cardiovascular;  Laterality: Left;  ? ?No family history on file. ?Social History  ? ?Tobacco Use  ? Smoking status: Every Day  ?  Packs/day: 0.50  ?  Years: 20.00  ?  Pack years: 10.00  ?  Types: Cigarettes  ? Smokeless tobacco: Never  ?Vaping Use  ? Vaping Use: Never used  ?Substance Use Topics  ? Alcohol use: Not Currently  ? Drug use: Never  ? ? ?Pertinent Clinical Results:  ?LABS: Labs reviewed: Acceptable for surgery. ? ?No visits with results within 3 Day(s) from this visit.  ?Latest known visit with results is:  ?Hospital Outpatient Visit on 02/01/2022  ?Component Date Value Ref Range Status  ?  MRSA, PCR 02/01/2022 NEGATIVE  NEGATIVE Final  ? Staphylococcus aureus 02/01/2022 NEGATIVE  NEGATIVE Final  ? Comment: (NOTE) ?The Xpert SA Assay (FDA approved for NASAL specimens in patients 13 ?years of age and older), is one component of a comprehensive ?surveillance program. It is not intended to diagnose infection nor to ?guide or monitor treatment. ?Performed at [facility], [address] [place], ?[place] [address] ?  ? WBC 02/01/2022 7.0  4.0 - 10.5 K/uL Final  ? RBC 02/01/2022 5.37  4.22 - 5.81 MIL/uL Final  ? Hemoglobin 02/01/2022 15.8  13.0 - 17.0 g/dL Final  ? HCT 60/45/4098 47.4  39.0 - 52.0 % Final   ? MCV 02/01/2022 88.3  80.0 - 100.0 fL Final  ? MCH 02/01/2022 29.4  26.0 - 34.0 pg Final  ? MCHC 02/01/2022 33.3  30.0 - 36.0 g/dL Final  ? RDW 11/91/4782 13.2  11.5 - 15.5 % Final  ? Platelets 02/01/2022

## 2022-02-15 ENCOUNTER — Other Ambulatory Visit: Payer: Self-pay

## 2022-02-15 ENCOUNTER — Ambulatory Visit: Payer: Medicaid Other | Admitting: Urgent Care

## 2022-02-15 ENCOUNTER — Ambulatory Visit
Admission: RE | Admit: 2022-02-15 | Discharge: 2022-02-15 | Disposition: A | Discharge status: Home / Self Care | Payer: Medicaid Other | Attending: Surgery | Admitting: Surgery

## 2022-02-15 ENCOUNTER — Encounter: Payer: Self-pay | Admitting: Surgery

## 2022-02-15 ENCOUNTER — Encounter
Admission: RE | Disposition: A | Discharge status: Home / Self Care | Payer: Self-pay | Source: Home / Self Care | Attending: Surgery

## 2022-02-15 ENCOUNTER — Ambulatory Visit: Payer: Medicaid Other

## 2022-02-15 DIAGNOSIS — M1990 Unspecified osteoarthritis, unspecified site: Secondary | ICD-10-CM | POA: Diagnosis not present

## 2022-02-15 DIAGNOSIS — M1711 Unilateral primary osteoarthritis, right knee: Secondary | ICD-10-CM | POA: Diagnosis not present

## 2022-02-15 DIAGNOSIS — K219 Gastro-esophageal reflux disease without esophagitis: Secondary | ICD-10-CM | POA: Insufficient documentation

## 2022-02-15 DIAGNOSIS — F17200 Nicotine dependence, unspecified, uncomplicated: Secondary | ICD-10-CM | POA: Insufficient documentation

## 2022-02-15 DIAGNOSIS — I10 Essential (primary) hypertension: Secondary | ICD-10-CM | POA: Diagnosis not present

## 2022-02-15 DIAGNOSIS — X58XXXA Exposure to other specified factors, initial encounter: Secondary | ICD-10-CM | POA: Insufficient documentation

## 2022-02-15 DIAGNOSIS — I2510 Atherosclerotic heart disease of native coronary artery without angina pectoris: Secondary | ICD-10-CM | POA: Diagnosis not present

## 2022-02-15 DIAGNOSIS — S83241A Other tear of medial meniscus, current injury, right knee, initial encounter: Secondary | ICD-10-CM | POA: Insufficient documentation

## 2022-02-15 HISTORY — PX: PARTIAL KNEE ARTHROPLASTY: SHX2174

## 2022-02-15 LAB — ABO/RH: ABO/RH(D): O POS

## 2022-02-15 SURGERY — ARTHROPLASTY, KNEE, UNICOMPARTMENTAL
Anesthesia: General | Site: Knee | Laterality: Right

## 2022-02-15 MED ORDER — KETAMINE HCL 50 MG/5ML IJ SOSY
PREFILLED_SYRINGE | INTRAMUSCULAR | Status: AC
  Filled 2022-02-15: qty 5

## 2022-02-15 MED ORDER — SODIUM CHLORIDE 0.9 % IV SOLN: INTRAVENOUS | Status: DC

## 2022-02-15 MED ORDER — STERILE WATER FOR IRRIGATION IR SOLN
Status: DC | PRN
  Administered 2022-02-15: 1000 mL

## 2022-02-15 MED ORDER — OXYCODONE HCL 5 MG/5ML PO SOLN: 5.0000 mg | Freq: Once | ORAL | Status: DC | PRN

## 2022-02-15 MED ORDER — HYDROMORPHONE HCL 1 MG/ML IJ SOLN
INTRAMUSCULAR | Status: AC
  Administered 2022-02-15: 0.5 mg via INTRAVENOUS
  Filled 2022-02-15: qty 1

## 2022-02-15 MED ORDER — ACETAMINOPHEN 10 MG/ML IV SOLN
INTRAVENOUS | Status: AC
  Filled 2022-02-15: qty 100

## 2022-02-15 MED ORDER — ONDANSETRON HCL 4 MG/2ML IJ SOLN: 4.0000 mg | Freq: Once | INTRAMUSCULAR | Status: DC | PRN

## 2022-02-15 MED ORDER — OXYCODONE HCL 5 MG PO TABS: 5.0000 mg | ORAL_TABLET | Freq: Once | ORAL | Status: DC | PRN

## 2022-02-15 MED ORDER — ONDANSETRON HCL 4 MG/2ML IJ SOLN
INTRAMUSCULAR | Status: DC | PRN
  Administered 2022-02-15: 4 mg via INTRAVENOUS

## 2022-02-15 MED ORDER — BUPIVACAINE LIPOSOME 1.3 % IJ SUSP
INTRAMUSCULAR | Status: AC
  Filled 2022-02-15: qty 20

## 2022-02-15 MED ORDER — DEXAMETHASONE SODIUM PHOSPHATE 10 MG/ML IJ SOLN
INTRAMUSCULAR | Status: DC | PRN
  Administered 2022-02-15: 10 mg via INTRAVENOUS

## 2022-02-15 MED ORDER — CEFAZOLIN SODIUM-DEXTROSE 2-4 GM/100ML-% IV SOLN
INTRAVENOUS | Status: AC
  Filled 2022-02-15: qty 100

## 2022-02-15 MED ORDER — CHLORHEXIDINE GLUCONATE 0.12 % MT SOLN: 15.0000 mL | Freq: Once | OROMUCOSAL | Status: AC

## 2022-02-15 MED ORDER — CEFAZOLIN SODIUM-DEXTROSE 2-4 GM/100ML-% IV SOLN
2.0000 g | Freq: Four times a day (QID) | INTRAVENOUS | Status: DC
  Administered 2022-02-15: 2 g via INTRAVENOUS

## 2022-02-15 MED ORDER — SODIUM CHLORIDE 0.9 % BOLUS PEDS
250.0000 mL | Freq: Once | INTRAVENOUS | Status: AC
  Administered 2022-02-15: 250 mL via INTRAVENOUS

## 2022-02-15 MED ORDER — LIDOCAINE HCL (CARDIAC) PF 100 MG/5ML IV SOSY
PREFILLED_SYRINGE | INTRAVENOUS | Status: DC | PRN
  Administered 2022-02-15: 100 mg via INTRAVENOUS

## 2022-02-15 MED ORDER — SUCCINYLCHOLINE CHLORIDE 200 MG/10ML IV SOSY
PREFILLED_SYRINGE | INTRAVENOUS | Status: DC | PRN
  Administered 2022-02-15: 120 mg via INTRAVENOUS

## 2022-02-15 MED ORDER — SODIUM CHLORIDE 0.9 % IR SOLN
Status: DC | PRN
  Administered 2022-02-15: 3000 mL

## 2022-02-15 MED ORDER — ONDANSETRON HCL 4 MG/2ML IJ SOLN
INTRAMUSCULAR | Status: AC
  Filled 2022-02-15: qty 2

## 2022-02-15 MED ORDER — ACETAMINOPHEN 500 MG PO TABS: 1000.0000 mg | ORAL_TABLET | Freq: Four times a day (QID) | ORAL | Status: DC

## 2022-02-15 MED ORDER — ONDANSETRON HCL 4 MG/2ML IJ SOLN: 4.0000 mg | Freq: Four times a day (QID) | INTRAMUSCULAR | Status: DC | PRN

## 2022-02-15 MED ORDER — SUGAMMADEX SODIUM 200 MG/2ML IV SOLN
INTRAVENOUS | Status: DC | PRN
  Administered 2022-02-15: 200 mg via INTRAVENOUS

## 2022-02-15 MED ORDER — KETOROLAC TROMETHAMINE 30 MG/ML IJ SOLN
30.0000 mg | Freq: Once | INTRAMUSCULAR | Status: AC
Start: 2022-02-15 — End: 2022-02-15
  Administered 2022-02-15: 30 mg via INTRAVENOUS

## 2022-02-15 MED ORDER — OXYCODONE HCL 5 MG PO TABS
ORAL_TABLET | ORAL | Status: AC
  Filled 2022-02-15: qty 2

## 2022-02-15 MED ORDER — PROPOFOL 10 MG/ML IV BOLUS
INTRAVENOUS | Status: DC | PRN
  Administered 2022-02-15: 200 mg via INTRAVENOUS

## 2022-02-15 MED ORDER — LACTATED RINGERS IV SOLN: INTRAVENOUS | Status: DC

## 2022-02-15 MED ORDER — OXYCODONE HCL 5 MG PO TABS: 5.0000 mg | ORAL_TABLET | ORAL | 0 refills | Status: DC | PRN

## 2022-02-15 MED ORDER — ACETAMINOPHEN 10 MG/ML IV SOLN
1000.0000 mg | Freq: Once | INTRAVENOUS | Status: DC | PRN
  Administered 2022-02-15: 1000 mg via INTRAVENOUS

## 2022-02-15 MED ORDER — PHENYLEPHRINE HCL (PRESSORS) 10 MG/ML IV SOLN
INTRAVENOUS | Status: DC | PRN
  Administered 2022-02-15: 100 ug via INTRAVENOUS

## 2022-02-15 MED ORDER — ROCURONIUM BROMIDE 10 MG/ML (PF) SYRINGE
PREFILLED_SYRINGE | INTRAVENOUS | Status: AC
  Filled 2022-02-15: qty 10

## 2022-02-15 MED ORDER — ORAL CARE MOUTH RINSE: 15.0000 mL | Freq: Once | OROMUCOSAL | Status: AC

## 2022-02-15 MED ORDER — APIXABAN 2.5 MG PO TABS: 2.5000 mg | ORAL_TABLET | Freq: Two times a day (BID) | ORAL | 0 refills | Status: DC

## 2022-02-15 MED ORDER — MIDAZOLAM HCL 2 MG/2ML IJ SOLN
INTRAMUSCULAR | Status: DC | PRN
  Administered 2022-02-15: 2 mg via INTRAVENOUS

## 2022-02-15 MED ORDER — CEFAZOLIN SODIUM-DEXTROSE 2-4 GM/100ML-% IV SOLN
2.0000 g | INTRAVENOUS | Status: AC
  Administered 2022-02-15: 2 g via INTRAVENOUS

## 2022-02-15 MED ORDER — CHLORHEXIDINE GLUCONATE 0.12 % MT SOLN
OROMUCOSAL | Status: AC
  Administered 2022-02-15: 15 mL via OROMUCOSAL
  Filled 2022-02-15: qty 15

## 2022-02-15 MED ORDER — OXYCODONE HCL 5 MG PO TABS
5.0000 mg | ORAL_TABLET | ORAL | Status: DC | PRN
  Administered 2022-02-15: 10 mg via ORAL

## 2022-02-15 MED ORDER — HYDROMORPHONE HCL 1 MG/ML IJ SOLN
INTRAMUSCULAR | Status: DC | PRN
  Administered 2022-02-15 (×2): .5 mg via INTRAVENOUS

## 2022-02-15 MED ORDER — 0.9 % SODIUM CHLORIDE (POUR BTL) OPTIME
TOPICAL | Status: DC | PRN
  Administered 2022-02-15: 500 mL

## 2022-02-15 MED ORDER — FENTANYL CITRATE (PF) 100 MCG/2ML IJ SOLN
INTRAMUSCULAR | Status: AC
  Administered 2022-02-15: 50 ug via INTRAVENOUS
  Filled 2022-02-15: qty 2

## 2022-02-15 MED ORDER — KETOROLAC TROMETHAMINE 15 MG/ML IJ SOLN: 15.0000 mg | Freq: Four times a day (QID) | INTRAMUSCULAR | Status: DC

## 2022-02-15 MED ORDER — DEXAMETHASONE SODIUM PHOSPHATE 10 MG/ML IJ SOLN
INTRAMUSCULAR | Status: AC
  Filled 2022-02-15: qty 1

## 2022-02-15 MED ORDER — TRANEXAMIC ACID 1000 MG/10ML IV SOLN
INTRAVENOUS | Status: DC | PRN
  Administered 2022-02-15: 1000 mg via TOPICAL

## 2022-02-15 MED ORDER — SUCCINYLCHOLINE CHLORIDE 200 MG/10ML IV SOSY
PREFILLED_SYRINGE | INTRAVENOUS | Status: AC
  Filled 2022-02-15: qty 10

## 2022-02-15 MED ORDER — SODIUM CHLORIDE FLUSH 0.9 % IV SOLN
INTRAVENOUS | Status: AC
  Filled 2022-02-15: qty 10

## 2022-02-15 MED ORDER — PHENYLEPHRINE 80 MCG/ML (10ML) SYRINGE FOR IV PUSH (FOR BLOOD PRESSURE SUPPORT)
PREFILLED_SYRINGE | INTRAVENOUS | Status: DC | PRN
  Administered 2022-02-15: 80 ug via INTRAVENOUS

## 2022-02-15 MED ORDER — KETAMINE HCL 10 MG/ML IJ SOLN
INTRAMUSCULAR | Status: DC | PRN
  Administered 2022-02-15 (×2): 25 mg via INTRAVENOUS

## 2022-02-15 MED ORDER — BUPIVACAINE-EPINEPHRINE (PF) 0.5% -1:200000 IJ SOLN
INTRAMUSCULAR | Status: AC
  Filled 2022-02-15: qty 30

## 2022-02-15 MED ORDER — HYDROMORPHONE HCL 1 MG/ML IJ SOLN
INTRAMUSCULAR | Status: AC
  Filled 2022-02-15: qty 1

## 2022-02-15 MED ORDER — METOCLOPRAMIDE HCL 5 MG/ML IJ SOLN: 5.0000 mg | Freq: Three times a day (TID) | INTRAMUSCULAR | Status: DC | PRN

## 2022-02-15 MED ORDER — LIDOCAINE HCL (PF) 2 % IJ SOLN
INTRAMUSCULAR | Status: AC
  Filled 2022-02-15: qty 5

## 2022-02-15 MED ORDER — HYDROMORPHONE HCL 1 MG/ML IJ SOLN
0.5000 mg | INTRAMUSCULAR | Status: DC | PRN
  Administered 2022-02-15: 0.5 mg via INTRAVENOUS

## 2022-02-15 MED ORDER — ROCURONIUM BROMIDE 100 MG/10ML IV SOLN
INTRAVENOUS | Status: DC | PRN
  Administered 2022-02-15: 30 mg via INTRAVENOUS
  Administered 2022-02-15: 20 mg via INTRAVENOUS

## 2022-02-15 MED ORDER — FENTANYL CITRATE (PF) 100 MCG/2ML IJ SOLN
INTRAMUSCULAR | Status: AC
  Administered 2022-02-15: 25 ug via INTRAVENOUS
  Filled 2022-02-15: qty 2

## 2022-02-15 MED ORDER — TRANEXAMIC ACID 1000 MG/10ML IV SOLN
INTRAVENOUS | Status: AC
  Filled 2022-02-15: qty 10

## 2022-02-15 MED ORDER — MIDAZOLAM HCL 2 MG/2ML IJ SOLN
INTRAMUSCULAR | Status: AC
  Filled 2022-02-15: qty 2

## 2022-02-15 MED ORDER — METOCLOPRAMIDE HCL 10 MG PO TABS: 5.0000 mg | ORAL_TABLET | Freq: Three times a day (TID) | ORAL | Status: DC | PRN

## 2022-02-15 MED ORDER — PROPOFOL 1000 MG/100ML IV EMUL
INTRAVENOUS | Status: AC
  Filled 2022-02-15: qty 100

## 2022-02-15 MED ORDER — FENTANYL CITRATE (PF) 100 MCG/2ML IJ SOLN
INTRAMUSCULAR | Status: AC
  Filled 2022-02-15: qty 2

## 2022-02-15 MED ORDER — FENTANYL CITRATE (PF) 100 MCG/2ML IJ SOLN
25.0000 ug | INTRAMUSCULAR | Status: DC | PRN
  Administered 2022-02-15: 50 ug via INTRAVENOUS
  Administered 2022-02-15: 25 ug via INTRAVENOUS

## 2022-02-15 MED ORDER — BUPIVACAINE-EPINEPHRINE (PF) 0.5% -1:200000 IJ SOLN
INTRAMUSCULAR | Status: DC | PRN
  Administered 2022-02-15: 60 mL

## 2022-02-15 MED ORDER — ONDANSETRON HCL 4 MG PO TABS
4.0000 mg | ORAL_TABLET | Freq: Four times a day (QID) | ORAL | Status: DC | PRN
Start: 2022-02-15 — End: 2022-02-15

## 2022-02-15 MED ORDER — KETOROLAC TROMETHAMINE 30 MG/ML IJ SOLN
INTRAMUSCULAR | Status: AC
  Filled 2022-02-15: qty 1

## 2022-02-15 MED ORDER — FENTANYL CITRATE (PF) 100 MCG/2ML IJ SOLN
INTRAMUSCULAR | Status: DC | PRN
Start: 2022-02-15 — End: 2022-02-15
  Administered 2022-02-15 (×2): 50 ug via INTRAVENOUS

## 2022-02-15 SURGICAL SUPPLY — 65 items
APL PRP STRL LF DISP 70% ISPRP (MISCELLANEOUS) ×2
BEARING MENISCAL TIBIAL 4 MD R (Orthopedic Implant) ×1 IMPLANT
BIT DRILL QUICK REL 1/8 2PK SL (DRILL) IMPLANT
BNDG ELASTIC 6X5.8 VLCR STR LF (GAUZE/BANDAGES/DRESSINGS) ×2 IMPLANT
BRNG TIB MED 4 PHS 3 RT MEN (Orthopedic Implant) ×1 IMPLANT
CEMENT BONE R 1X40 (Cement) ×2 IMPLANT
CEMENT VACUUM MIXING SYSTEM (MISCELLANEOUS) ×2 IMPLANT
CHLORAPREP W/TINT 26 (MISCELLANEOUS) ×4 IMPLANT
COOLER POLAR GLACIER W/PUMP (MISCELLANEOUS) ×2 IMPLANT
COVER MAYO STAND REUSABLE (DRAPES) ×2 IMPLANT
CUFF TOURN SGL QUICK 24 (TOURNIQUET CUFF)
CUFF TOURN SGL QUICK 34 (TOURNIQUET CUFF)
CUFF TRNQT CYL 24X4X16.5-23 (TOURNIQUET CUFF) IMPLANT
CUFF TRNQT CYL 34X4.125X (TOURNIQUET CUFF) IMPLANT
DRAPE C-ARM XRAY 36X54 (DRAPES) IMPLANT
DRAPE U-SHAPE 47X51 STRL (DRAPES) ×4 IMPLANT
DRILL QUICK RELEASE 1/8 INCH (DRILL) ×1
DRSG MEPILEX SACRM 8.7X9.8 (GAUZE/BANDAGES/DRESSINGS) ×2 IMPLANT
DRSG OPSITE POSTOP 4X12 (GAUZE/BANDAGES/DRESSINGS) ×2 IMPLANT
DRSG OPSITE POSTOP 4X6 (GAUZE/BANDAGES/DRESSINGS) ×2 IMPLANT
ELECT CAUTERY BLADE 6.4 (BLADE) ×2 IMPLANT
ELECT REM PT RETURN 9FT ADLT (ELECTROSURGICAL) ×2
ELECTRODE REM PT RTRN 9FT ADLT (ELECTROSURGICAL) ×1 IMPLANT
GAUZE SPONGE 4X4 12PLY STRL (GAUZE/BANDAGES/DRESSINGS) ×2 IMPLANT
GAUZE XEROFORM 1X8 LF (GAUZE/BANDAGES/DRESSINGS) ×2 IMPLANT
GLOVE BIO SURGEON STRL SZ7.5 (GLOVE) ×8 IMPLANT
GLOVE BIO SURGEON STRL SZ8 (GLOVE) ×8 IMPLANT
GLOVE BIOGEL PI IND STRL 8 (GLOVE) ×1 IMPLANT
GLOVE BIOGEL PI INDICATOR 8 (GLOVE) ×1
GLOVE SURG UNDER LTX SZ8 (GLOVE) ×2 IMPLANT
GOWN STRL REUS W/ TWL LRG LVL3 (GOWN DISPOSABLE) ×1 IMPLANT
GOWN STRL REUS W/ TWL XL LVL3 (GOWN DISPOSABLE) ×1 IMPLANT
GOWN STRL REUS W/TWL LRG LVL3 (GOWN DISPOSABLE) ×2
GOWN STRL REUS W/TWL XL LVL3 (GOWN DISPOSABLE) ×2
HOOD PEEL AWAY FLYTE STAYCOOL (MISCELLANEOUS) ×6 IMPLANT
IV NS IRRIG 3000ML ARTHROMATIC (IV SOLUTION) ×2 IMPLANT
KIT TURNOVER KIT A (KITS) ×2 IMPLANT
KNEE UNICOMP MEDIAL OXFORD RT (Joint) ×1 IMPLANT
MANIFOLD NEPTUNE II (INSTRUMENTS) ×2 IMPLANT
MAT ABSORB  FLUID 56X50 GRAY (MISCELLANEOUS) ×1
MAT ABSORB FLUID 56X50 GRAY (MISCELLANEOUS) ×1 IMPLANT
NDL SAFETY ECLIPSE 18X1.5 (NEEDLE) ×1 IMPLANT
NDL SPNL 20GX3.5 QUINCKE YW (NEEDLE) ×1 IMPLANT
NEEDLE HYPO 18GX1.5 SHARP (NEEDLE) ×2
NEEDLE SPNL 20GX3.5 QUINCKE YW (NEEDLE) ×2 IMPLANT
NS IRRIG 1000ML POUR BTL (IV SOLUTION) ×2 IMPLANT
PACK BLADE SAW RECIP 70 3 PT (BLADE) ×1 IMPLANT
PACK TOTAL KNEE (MISCELLANEOUS) ×2 IMPLANT
PAD ABD DERMACEA PRESS 5X9 (GAUZE/BANDAGES/DRESSINGS) ×4 IMPLANT
PAD WRAPON POLAR KNEE (MISCELLANEOUS) ×1 IMPLANT
PEG TWIN FEM CEMENTED MED (Knees) ×1 IMPLANT
PULSAVAC PLUS IRRIG FAN TIP (DISPOSABLE) ×2
STAPLER SKIN PROX 35W (STAPLE) ×2 IMPLANT
STRAP SAFETY 5IN WIDE (MISCELLANEOUS) ×2 IMPLANT
SUCTION FRAZIER HANDLE 10FR (MISCELLANEOUS) ×1
SUCTION TUBE FRAZIER 10FR DISP (MISCELLANEOUS) ×1 IMPLANT
SUT VIC AB 0 CT1 36 (SUTURE) ×2 IMPLANT
SUT VIC AB 2-0 CT1 27 (SUTURE) ×8
SUT VIC AB 2-0 CT1 TAPERPNT 27 (SUTURE) ×4 IMPLANT
SYR 10ML LL (SYRINGE) ×2 IMPLANT
SYR 30ML LL (SYRINGE) ×4 IMPLANT
TAPE TRANSPORE STRL 2 31045 (GAUZE/BANDAGES/DRESSINGS) ×2 IMPLANT
TIP FAN IRRIG PULSAVAC PLUS (DISPOSABLE) ×1 IMPLANT
WATER STERILE IRR 500ML POUR (IV SOLUTION) ×2 IMPLANT
WRAPON POLAR PAD KNEE (MISCELLANEOUS) ×2

## 2022-02-15 NOTE — Transfer of Care (Signed)
Immediate Anesthesia Transfer of Care Note ? ?Patient: Alex Brandt ? ?Procedure(s) Performed: UNICOMPARTMENTAL KNEE (Right: Knee) ? ?Patient Location: PACU ? ?Anesthesia Type:General ? ?Level of Consciousness: awake, alert  and oriented ? ?Airway & Oxygen Therapy: Patient Spontanous Breathing and Patient connected to face mask oxygen ? ?Post-op Assessment: Report given to RN and Post -op Vital signs reviewed and stable ? ?Post vital signs: Reviewed and stable ? ?Last Vitals:  ?Vitals Value Taken Time  ?BP 104/66 02/15/22 1249  ?Temp    ?Pulse 80 02/15/22 1252  ?Resp 16 02/15/22 1252  ?SpO2 98 % 02/15/22 1252  ?Vitals shown include unvalidated device data. ? ?Last Pain:  ?Vitals:  ? 02/15/22 0907  ?TempSrc: Temporal  ?PainSc: 5   ?   ? ?  ? ?Complications: No notable events documented. ?

## 2022-02-15 NOTE — H&P (Signed)
History of Present Illness: ?Alex Brandt is a 46 y.o. male who presents today for his surgical history and physical for upcoming right unicondylar knee arthroplasty. Surgery scheduled with Dr. Joice Lofts on 02/15/2022. The patient has been experiencing right knee pain for several years which developed as a result of a injury crawling underneath the house. The patient initially tore his meniscus which was repaired at [facility] in 2021, the patient did have a difficult recovery following the procedure and then reinjured his knee 1 year later, at this time he saw Dr. Allena Katz who performed a arthroscopic partial medial meniscectomy however the patient continued to experience medial sided knee pain with any prolonged activity such as standing and squatting. The patient did undergo a MRI scan which demonstrated the presence of progression of his arthritic changes involving the medial compartment as well as underlying bone marrow edema in the medial tibial plateau. The patient reports a 5 out of 10 pain score in the right knee at today's visit. Given his underlying osteoarthritic changes, the patient elected to consider joint replacement surgery rather than continue conservative treatment. The patient denies any personal history of heart attack, stroke, asthma or COPD. No personal history of blood clots. The patient does take a 81 mg aspirin daily which he has stopped for the past week. ? ?Past Medical History: ? Hyperlipidemia  ? ?Past Surgical History: ? Appendectomy  ? CHOLECYSTECTOMY OPEN  ? Right medial meniscus repair 05/27/20 Right  ? ?Past Family History: ? Myocardial Infarction (Heart attack) Mother  ? High blood pressure (Hypertension) Mother  ? Myocardial Infarction (Heart attack) Father  ? High blood pressure (Hypertension) Father  ? Heart disease Maternal Grandmother  ? ?Medications: ? aspirin 81 MG chewable tablet Take 1 tablet (81 mg total) by mouth once daily 90 tablet 3  ? calcitonin, salmon, (MIACALCIN) 200  unit/actuation nasal spray Place 1 spray into the left nostril once daily 3.7 mL 1  ? diltiazem (CARDIZEM CD) 120 MG XR capsule Take 1 capsule (120 mg total) by mouth once daily 30 capsule 11  ? ibuprofen (MOTRIN) 800 MG tablet  ? nitroGLYcerin (NITROSTAT) 0.4 MG SL tablet Place 1 tablet (0.4 mg total) under the tongue every 5 (five) minutes as needed for Chest pain May take up to 3 doses. 25 tablet 11  ? omeprazole (PRILOSEC) 20 MG DR capsule  ? pravastatin (PRAVACHOL) 40 MG tablet 1 tablet (40 mg total)  ? ?Allergies: ? Shellfish Containing Products Swelling  ? Hydrocodone Nausea And Vomiting  ? ?Review of Systems:  ?A comprehensive 14 point ROS was performed, reviewed by me today, and the pertinent orthopaedic findings are documented in the HPI. ? ?Physical Exam: ?BP 124/78 (BP Location: Left upper arm, Patient Position: Sitting, BP Cuff Size: Adult)  Ht 195.6 cm (6\' 5" )  Wt 93.5 kg (206 lb 3.2 oz)  BMI 24.45 kg/m?  ?General/Constitutional: The patient appears to be well-nourished, well-developed, and in no acute distress. ?Neuro/Psych: Normal mood and affect, oriented to person, place and time. ?Eyes: Non-icteric. Pupils are equal, round, and reactive to light, and exhibit synchronous movement. ?ENT: Unremarkable. ?Lymphatic: No palpable adenopathy. ?Respiratory: Lungs clear to auscultation, Normal chest excursion, No wheezes and Non-labored breathing ?Cardiovascular: Regular rate and rhythm. No murmurs. and No edema, swelling or tenderness, except as noted in detailed exam. ?Integumentary: No impressive skin lesions present, except as noted in detailed exam. ?Musculoskeletal: Unremarkable, except as noted in detailed exam.  ? ?Right knee exam: ?GAIT: mild limp and uses no assistive  devices. ?ALIGNMENT: normal ?SKIN: Well-healed arthroscopic portal sites, otherwise unremarkable ?SWELLING: minimal ?EFFUSION: trace ?WARMTH: no warmth ?TENDERNESS: moderate over the medial joint line, but no lateral joint line  tenderness ?ROM: 0 to 135 degrees with pain in maximal flexion ?McMURRAY'S: positive ?PATELLOFEMORAL: normal tracking with no peri-patellar tenderness and negative apprehension sign ?CREPITUS: Mild soft click along lateral margin of patellofemoral joint, symmetric to the left knee ?LACHMAN'S: negative ?PIVOT SHIFT: negative ?ANTERIOR DRAWER: negative ?POSTERIOR DRAWER: negative ?VARUS/VALGUS: stable ?  ?He is neurovascularly intact to the right lower extremity and foot. ?  ?Knee Imaging: ?Recent AP weightbearing of both knees, as well as lateral and merchant views of the right knee are little for review and have been reviewed by myself. These films demonstrate mild degenerative changes, primarily involving the medial compartment with 10% medial joint space narrowing. Overall alignment is neutral. No fractures, lytic lesions, or abnormal calcifications are noted. ?  ?Knee Imaging, external: ?Right knee: A recent MRI scan of the right knee is available for review and has been reviewed by myself. This study demonstrates moderate degenerative changes of the medial compartment along with postsurgical changes consistent with a prior partial medial meniscectomy. The lateral compartment appears to be well-maintained. Mild chondral thinning of the medial facet of the patella also was noted. No ligamentous pathology is identified. Both the images and report were reviewed by me at today's visit. ? ?Impression: ?Post-traumatic osteoarthritis of right knee. ? ?Plan:  ?1. Treatment options were discussed today with the patient. ?2. The patient is scheduled for a right unicondylar knee arthroplasty with Dr. Joice Lofts on 02/15/2022. ?3. The patient was instructed on the risk and benefits of surgery and wishes to proceed at this time. ?4. This document will serve as a surgical history and physical for the patient. ?5. He was instructed to hold on his 81 mg aspirin until after he has completed his Eliquis prescription following surgery.  The patient was instructed on the importance of smoking cessation. ?6. The patient will follow-up per standard postop protocol. They can call the clinic they have any questions, new symptoms develop or symptoms worsen. ? ?The procedure was discussed with the patient, as were the potential risks (including bleeding, infection, nerve and/or blood vessel injury, persistent or recurrent pain, failure of the hardware, dislocation, progression of arthritis, need for further surgery, blood clots, strokes, heart attacks and/or arhythmias, pneumonia, etc.) and benefits. The patient states his understanding and wishes to proceed. ? ? ?H&P reviewed and patient re-examined. No changes. ? ?

## 2022-02-15 NOTE — Evaluation (Signed)
Physical Therapy Evaluation ?Patient Details ?Name: Alex Brandt ?MRN: [id number] ?DOB: November 12, 1975 ?Today's Date: 02/15/2022 ? ?History of Present Illness ? Pt admitted for R uni knee and is POD 0 at time of evaluation.  ?Clinical Impression ? Pt is a pleasant 46 year old male who was admitted for R uni knee and is POD 0 at this time. Pt performs bed mobility, transfers, and ambulation with cga and RW. Pt demonstrates deficits with strength/ROM/mobility. Pt demonstrates ability to perform 10 SLRs with independence, therefore does not require KI for mobility. ? Would benefit from skilled PT to address above deficits and promote optimal return to PLOF. Currently recommending OP PT. ? ?Pt unable to perform further ambulation and stair training this session. Will return to perform additional session to determine ability to dc this date. ?   ? ?Recommendations for follow up therapy are one component of a multi-disciplinary discharge planning process, led by the attending physician.  Recommendations may be updated based on patient status, additional functional criteria and insurance authorization. ? ?Follow Up Recommendations Outpatient PT ? ?  ?Assistance Recommended at Discharge Frequent or constant Supervision/Assistance  ?Patient can return home with the following ? A lot of help with walking and/or transfers;A lot of help with bathing/dressing/bathroom;Help with stairs or ramp for entrance ? ?  ?Equipment Recommendations Rolling walker (2 wheels);BSC/3in1  ?Recommendations for Other Services ?    ?  ?Functional Status Assessment Patient has had a recent decline in their functional status and demonstrates the ability to make significant improvements in function in a reasonable and predictable amount of time.  ? ?  ?Precautions / Restrictions Precautions ?Precautions: Fall;Knee ?Precaution Booklet Issued: Yes (comment) ?Restrictions ?Weight Bearing Restrictions: Yes ?RLE Weight Bearing: Weight bearing as tolerated   ? ?  ? ?Mobility ? Bed Mobility ?Overal bed mobility: Needs Assistance ?Bed Mobility: Supine to Sit ?  ?  ?Supine to sit: Min guard ?  ?  ?General bed mobility comments: safe technique with guidance of surgical leg. Once seated, dizziness noted. ?  ? ?Transfers ?Overall transfer level: Needs assistance ?Equipment used: Rolling walker (2 wheels) ?Transfers: Sit to/from Stand ?Sit to Stand: Min guard ?  ?  ?  ?  ?  ?General transfer comment: safe technique with RW with cues for hand placement. Once standing, dizziness noted, increased with exertion ?  ? ?Ambulation/Gait ?Ambulation/Gait assistance: Min guard ?Gait Distance (Feet): 4 Feet ?Assistive device: Rolling walker (2 wheels) ?Gait Pattern/deviations: Step-to pattern ?  ?  ?  ?General Gait Details: ambulated with short step to gait pattern, however limited in further distance due to dizziness and nausea. Return back to bed ? ?Stairs ?  ?  ?  ?  ?  ? ?Wheelchair Mobility ?  ? ?Modified Rankin (Stroke Patients Only) ?  ? ?  ? ?Balance Overall balance assessment: Needs assistance ?Sitting-balance support: Feet supported ?Sitting balance-Leahy Scale: Good ?  ?  ?Standing balance support: Bilateral upper extremity supported ?Standing balance-Leahy Scale: Fair ?  ?  ?  ?  ?  ?  ?  ?  ?  ?  ?  ?  ?   ? ? ? ?Pertinent Vitals/Pain    ? ? ?Home Living Family/patient expects to be discharged to:: Private residence ?Living Arrangements: Spouse/significant other;Children ?Available Help at Discharge: Family;Available 24 hours/day ?Type of Home: House ?Home Access: Stairs to enter ?Entrance Stairs-Rails: None ?Entrance Stairs-Number of Steps: 3 ?  ?Home Layout: One level ?Home Equipment: Rollator (4 wheels) ?Additional Comments:  plans to perform stairs holding onto family members  ?  ?Prior Function Prior Level of Function : Independent/Modified Independent ?  ?  ?  ?  ?  ?  ?Mobility Comments: reports he was very active and his knee issues began with injury at work. Has  been out of work since incident and has had several surgeries on R knee ?ADLs Comments: indep ?  ? ? ?Hand Dominance  ?   ? ?  ?Extremity/Trunk Assessment  ? Upper Extremity Assessment ?Upper Extremity Assessment: Overall WFL for tasks assessed ?  ? ?Lower Extremity Assessment ?Lower Extremity Assessment: Generalized weakness (R LE grossly 3/5; L LE grossly 5/5) ?  ? ?   ?Communication  ? Communication: No difficulties  ?Cognition Arousal/Alertness: Awake/alert ?Behavior During Therapy: [facility] for tasks assessed/performed ?Overall Cognitive Status: Within Functional Limits for tasks assessed ?  ?  ?  ?  ?  ?  ?  ?  ?  ?  ?  ?  ?  ?  ?  ?  ?General Comments: drowsy, but pleasant ?  ?  ? ?  ?General Comments   ? ?  ?Exercises Total Joint Exercises ?Goniometric ROM: Flexion at 55 degrees and limited by pain ?Other Exercises ?Other Exercises: HEP given and reviewed with family including frequency and duration. ?Other Exercises: Performed supine ther-ex including AP, quad sets, SLRs, and hip abd/add. 10 reps with safe technique  ? ?Assessment/Plan  ?  ?PT Assessment Patient needs continued PT services  ?PT Problem List Decreased strength;Decreased range of motion;Decreased balance;Decreased mobility;Decreased knowledge of use of DME;Pain ? ?   ?  ?PT Treatment Interventions DME instruction;Gait training;Stair training;Therapeutic exercise;Balance training   ? ?PT Goals (Current goals can be found in the Care Plan section)  ?Acute Rehab PT Goals ?Patient Stated Goal: to go home ?PT Goal Formulation: With patient ?Time For Goal Achievement: 03/01/22 ?Potential to Achieve Goals: Good ? ?  ?Frequency BID ?  ? ? ?Co-evaluation   ?  ?  ?  ?  ? ? ?  ?AM-PAC PT "6 Clicks" Mobility  ?Outcome Measure Help needed turning from your back to your side while in a flat bed without using bedrails?: A Little ?Help needed moving from lying on your back to sitting on the side of a flat bed without using bedrails?: A Little ?Help needed moving  to and from a bed to a chair (including a wheelchair)?: A Little ?Help needed standing up from a chair using your arms (e.g., wheelchair or bedside chair)?: A Little ?Help needed to walk in hospital room?: A Lot ?Help needed climbing 3-5 steps with a railing? : A Lot ?6 Click Score: 16 ? ?  ?End of Session Equipment Utilized During Treatment: Gait belt ?Activity Tolerance: Patient limited by pain ?Patient left: in bed;with family/visitor present ?Nurse Communication: Mobility status ?PT Visit Diagnosis: Muscle weakness (generalized) (M62.81);Difficulty in walking, not elsewhere classified (R26.2);Pain ?Pain - Right/Left: Right ?Pain - part of body: Leg ?  ? ?Time: 7989-2119 ?PT Time Calculation (min) (ACUTE ONLY): 22 min ? ? ?Charges:   PT Evaluation ?$PT Eval Low Complexity: 1 Low ?PT Treatments ?$Therapeutic Exercise: 8-22 mins ?  ?   ? ? ?Elizabeth Palau, PT, DPT, GCS ?[phone] ? ? ?Licet Dunphy ?02/15/2022, 4:02 PM ? ?

## 2022-02-15 NOTE — Anesthesia Postprocedure Evaluation (Signed)
Anesthesia Post Note ? ?Patient: BRANDAN ROBICHEAUX ? ?Procedure(s) Performed: UNICOMPARTMENTAL KNEE (Right: Knee) ? ?Patient location during evaluation: PACU ?Anesthesia Type: General ?Level of consciousness: awake and alert, oriented and patient cooperative ?Pain management: pain level controlled ?Vital Signs Assessment: post-procedure vital signs reviewed and stable ?Respiratory status: spontaneous breathing, nonlabored ventilation and respiratory function stable ?Cardiovascular status: blood pressure returned to baseline and stable ?Postop Assessment: adequate PO intake ?Anesthetic complications: no ? ? ?No notable events documented. ? ? ?Last Vitals:  ?Vitals:  ? 02/15/22 1445 02/15/22 1504  ?BP: 120/73 110/63  ?Pulse: 63 71  ?Resp: (!) 25 16  ?Temp: (!) 36.3 ?C (!) 36.2 ?C  ?SpO2: 100% 100%  ?  ?Last Pain:  ?Vitals:  ? 02/15/22 1504  ?TempSrc: Temporal  ?PainSc: 6   ? ? ?  ?  ?  ?  ?  ?  ? ?Reed Breech ? ? ? ? ?

## 2022-02-15 NOTE — TOC Progression Note (Signed)
Transition of Care (TOC) - Progression Note  ? ? ?Patient Details  ?Name: MATVEI GRASS ?MRN: [id number] ?Date of Birth: 09-05-1976 ? ?Transition of Care (TOC) CM/SW Contact  ?Conception Oms, RN ?Phone Number: ?02/15/2022, 4:21 PM ? ?Clinical Narrative:    ?Patient would also like a 3 in 1, [employer] to deliver ? ? ?  ?  ? ?Expected Discharge Plan and Services ?  ?  ?  ?  ?  ?Expected Discharge Date: 02/15/22               ?  ?  ?  ?  ?  ?  ?  ?  ?  ?  ? ? ?Social Determinants of Health (SDOH) Interventions ?  ? ?Readmission Risk Interventions ?   ? View : No data to display.  ?  ?  ?  ? ? ?

## 2022-02-15 NOTE — Op Note (Signed)
02/15/2022 ? ?12:36 PM ? ?Patient:   Alex Brandt ? 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 ? ?Post-Op Diagnosis:   Same ? ?Procedure:   Right unicondylar knee arthroplasty. ? ?Surgeon:   Maryagnes Amos, MD ? ?Assistant:   Horris Latino, PA-C ? ?Anesthesia:   GET ? ?Findings:   As above. ? ?Complications:   None ? ?EBL:   5 cc ? ?Fluids:   900 cc crystalloid ? ?UOP:   None ? ?TT:   75 minutes at 300 mmHg ? ?Drains:   None ? ?Closure:   Staples ? ?Implants:   All-cemented Biomet Oxford system with a medium femoral component, a "D" sized tibial tray, and a 4 mm meniscal bearing insert. ? ?Brief Clinical Note:   The patient is a 46 year old male with a history of progressively worsening medial sided right knee pain following a twisting injury which resulted in a medial meniscus tear. This meniscus was repaired. Subsequently, he developed recurrent medial-sided right knee pain. He was diagnosed with a recurrent medial meniscus tear so he underwent a partial medial meniscectomy. Again, he had continued/recurrent medial sided right knee pain. A subsequent MRI scan demonstrated progressive worsening of the degenerative changes of his medial compartment with bone marrow edema involving the medial tibial plateau. The patient presents at this time for a right partial knee replacement. ? ?Procedure:   The patient was brought into the operating room and lain in the supine position.  After adequate general endotracheal intubation and anesthesia was obtained, the patient was repositioned so that the non-surgical leg was placed in a flexed and abducted position in the yellow fin leg holder while the surgical extremity was placed over the Biomet leg holder. The right lower extremity was prepped with ChloraPrep solution before being draped sterilely. Preoperative antibiotics were administered. After performing a timeout to verify  the appropriate surgical site, the limb was exsanguinated with an Esmarch and the tourniquet inflated to 300 mmHg.  ? ?A standard anterior approach to the knee was made through an approximately 3.5-4 inch incision. The incision was carried down through the subcutaneous tissues to expose the superficial retinaculum. This was split the length the incision and the medial flap elevated sufficiently to expose the medial retinaculum. The medial retinaculum was incised along the medial border of the patella tendon and extended proximally along the medial border of the patella, leaving a 3-4 mm cuff of tissue. The soft tissues were elevated off the anteromedial aspect of the proximal tibia. The anterior portion of the meniscus was removed after performing a subtotal excision of the infrapatellar fat pad. The anterior cruciate ligament was inspected and found to be in excellent condition. Osteophytes were removed from the inferior pole of the patella as well as from the notch using a quarter-inch osteotome.  ? ?The medial femoral condyle was sized using the large and medium sizers. It was felt that the medium guide best optimized the contour of the femur. This was left in place and the external tibial guide positioned. The coupling device was used to connect the guide to the medial femoral condylar sizer to optimize appropriate orientation. Two guide pins were inserted into the cutting block before the coupling device and sizer were removed. The appropriate tibial cut was made using the oscillating and reciprocating saws. The piece was removed in its entirety and taken to the back table where it was sized and found to be optimally  replicated by a "D" sized component. The 9 mm spacer was inserted to verify that sufficient bone had been removed. ? ?Attention was directed to femoral side. The intramedullary canal was accessed through a 4 mm drill hole. The intramedullary guide was positioned before the guide for the femoral  condylar holes was positioned. The appropriate coupling device connected this guide to the intramedullary guide before both drill holes were created in the distal aspect of the medial femoral condyle. The devices were removed and the posterior condylar cutting block inserted. The appropriate cut was made using the reciprocating saw and this piece removed. The #0 spigot was inserted and the initial bone milling performed. A trial femoral component was inserted and both the flexion and extension gaps measured. In flexion, the gap measured 8 mm whereas in extension, it measured 3 mm. Therefore, the #5 spigot was selected and the secondary bone milling performed. Repeat sizing demonstrated symmetric flexion and extension gaps. The bone was removed from the postero-medial and postero-lateral aspects of the femoral condyle, as well as from the beneath the collar of the spigot. Bone also was removed from the anterior portion of the femur so as to minimize any potential impingement with the meniscal bearing insert. The trial components removed and several drill holes placed into the distal femoral condyle to further augment cement fixation. ? ?Attention was redirected to the tibial side. The "D" sized tibial tray was positioned and temporarily secured using the appropriate spiked nail. The keel was created using the bi-bladed reciprocating saw and hoe. The keeled "D" sized trial tibial tray was inserted to be sure that it seated properly. At this point, a total of 20 cc of Exparel diluted out to 60 cc with normal saline and 30 cc of 0.5% Sensorcaine was injected in and around the posterior and medial capsular tissues, as well as the peri-incisional tissues to help with postoperative pain control. ? ?The bony surfaces were prepared for cementing by irrigating them thoroughly with bacitracin saline solution using the jet lavage system before packing them with a dry Ray-Tec sponge. Meanwhile, cement was being mixed on the back  table. When the cement was ready, the tibial tray was cemented in first. The excess cement was removed using a Public house manager after impacting it into place. Next, the femoral component was impacted into place. Again the excess cement was removed using a Public house manager. The 4 mm spacer was inserted and the knee brought into near full extension while the cement hardened. Once the cement hardened, the spacer was removed and the 4 mm meniscal bearing insert was trialed. This demonstrated excellent tracking while the knee was placed through a range of motion, and showed no evidence towards subluxation or dislocation. In addition, it did not fit too tightly. Therefore, the permanent 4 mm meniscal bearing insert was snapped into position after verifying that no cement had been retained posteriorly. Again the knee was placed through a range of motion with the findings as described above. ? ?The wound was copiously irrigated with sterile saline solution via the jet lavage system before the retinacular layer was reapproximated using #0 Vicryl interrupted sutures. At this point, 1 g of transexemic acid in 10 cc of normal saline was injected intra-articularly. The subcutaneous tissues were closed in two layers using 2-0 Vicryl interrupted sutures before the skin was closed using staples. A sterile occlusive dressing was applied to the knee before the patient was awakened. The patient was transferred back to his/her hospital bed and returned  to the recovery room in satisfactory condition after tolerating the procedure well. A Polar Care device was applied to the knee as well. ?

## 2022-02-15 NOTE — Discharge Instructions (Addendum)
Orthopedic discharge instructions: ?May shower with intact OpSite dressing. ?Apply ice frequently to knee or use Polar Care device. ?Start Eliquis 2.5 mg twice daily for 2 weeks on Wednesday, 02/16/2022, then take aspirin 325 mg twice daily for 4 weeks. ?Take pain medication as prescribed or ES Tylenol when needed.  ?May weight-bear as tolerated on right leg - use crutches or walker for balance and support. ?Start outpatient physical therapy Friday as scheduled. ?Follow-up in 10-14 days or as scheduled. ? ? ? ?AMBULATORY SURGERY  ?DISCHARGE INSTRUCTIONS ? ? ?The drugs that you were given will stay in your system until tomorrow so for the next 24 hours you should not: ? ?Drive an automobile ?Make any legal decisions ?Drink any alcoholic beverage ? ? ?You may resume regular meals tomorrow.  Today it is better to start with liquids and gradually work up to solid foods. ? ?You may eat anything you prefer, but it is better to start with liquids, then soup and crackers, and gradually work up to solid foods. ? ? ?Please notify your doctor immediately if you have any unusual bleeding, trouble breathing, redness and pain at the surgery site, drainage, fever, or pain not relieved by medication. ? ?  ? ? ? ?Additional Instructions:  ? ?Has received 1000mg  of tylenol at the hospital Maximum of 4000mg  in 24 hours.  Alternate with 800mg  of ibuprofen every 8 hours with food and tylenol 500mg  every 8 hours.  One or the other every 4 hours. ? ? ? ? ? ? ?

## 2022-02-15 NOTE — Anesthesia Preprocedure Evaluation (Addendum)
Anesthesia Evaluation  ?Patient identified by MRN, date of birth, ID band ?Patient awake ? ? ? ?Reviewed: ?Allergy & Precautions, NPO status , Patient's Chart, lab work & pertinent test results ? ?History of Anesthesia Complications ?Negative for: history of anesthetic complications ? ?Airway ?Mallampati: I ? ? ?Neck ROM: Full ? ? ? Dental ? ?(+) Poor Dentition ?  ?Pulmonary ?Current Smoker (1/2 - 1 ppd)Patient did not abstain from smoking.,  ?  ?Pulmonary exam normal ?breath sounds clear to auscultation ? ? ? ? ? ? Cardiovascular ?hypertension, + CAD  ?Normal cardiovascular exam ?Rhythm:Regular Rate:Normal ? ?ECG 02/01/22:  ?Normal sinus rhythm with sinus arrhythmia ?Normal ECG ? ?Left heart catheterization 05/05/21: Normal left ventricular systolic function with an EF of 60%.  There was multivessel nonobstructive CAD; 50% mid LAD, 50% D1, and 50% proximal to mid RCA.  LVEDP normal.  Intervention deferred opting for medical management. ?  ?Neuro/Psych ? Headaches,   ? GI/Hepatic ?GERD  ,  ?Endo/Other  ?negative endocrine ROS ? Renal/GU ?negative Renal ROS  ? ?  ?Musculoskeletal ? ?(+) Arthritis ,  ? Abdominal ?  ?Peds ? Hematology ?negative hematology ROS ?(+)   ?Anesthesia Other Findings ?Reviewed and agree with Edd Fabian pre-anesthesia clinical review note. ? ?Cardiology note 08/19/21:  ?46 y.o. male with  ?1. Hyperkalemia  ?2. Intermittent chest pain  ?3. Chronic dyspnea  ?4. Hypercholesterolemia  ?5. Tobacco use  ?6. Essential hypertension  ?7. Smoking  ?8. Pre-operative clearance  ?9. Gastroesophageal reflux disease, unspecified whether esophagitis present  ?10. Angina at rest (CMS-HCC)  ? ?Plan  ?1 angina chronic stable continue aspirin Coreg diltiazem nitroglycerin ?2 shortness of breath dyspnea reasonably stable continue blood pressure control consider diuretics continue consider inhaler ?3 hyperlipidemia chronic stable continue Pravachol therapy for lipid management ?4  GERD chronic stable continue omeprazole for reflux type symptoms ?5 smoking by history advised to refrain from tobacco abuse ?6 chronic left knee DJD pain discomfort preop for possible surgery have the patient follow-up with orthopedic ?7 have the patient follow-up in 6 months ? ?Return in about 6 months (around 02/16/2022). ? ? Reproductive/Obstetrics ? ?  ? ? ? ? ? ? ? ? ? ? ? ? ? ?  ?  ? ? ? ? ? ? ? ?Anesthesia Physical ?Anesthesia Plan ? ?ASA: 3 ? ?Anesthesia Plan: General  ? ?Post-op Pain Management:   ? ?Induction: Intravenous ? ?PONV Risk Score and Plan: 1 and Treatment may vary due to age or medical condition, Ondansetron and Dexamethasone ? ?Airway Management Planned: Oral ETT ? ?Additional Equipment:  ? ?Intra-op Plan:  ? ?Post-operative Plan: Extubation in OR ? ?Informed Consent: I have reviewed the patients History and Physical, chart, labs and discussed the procedure including the risks, benefits and alternatives for the proposed anesthesia with the patient or authorized representative who has indicated his/her understanding and acceptance.  ? ? ? ?Dental advisory given ? ?Plan Discussed with: CRNA ? ?Anesthesia Plan Comments: (Patient prefers GETA to spinal; risks and benefits of both discussed.  Patient consented for risks of anesthesia including but not limited to:  ?- adverse reactions to medications ?- damage to eyes, teeth, lips or other oral mucosa ?- nerve damage due to positioning  ?- sore throat or hoarseness ?- damage to heart, brain, nerves, lungs, other parts of body or loss of life ? ?Informed patient about role of CRNA in peri- and intra-operative care.  Patient voiced understanding.)  ? ? ? ? ? ?Anesthesia Quick Evaluation ? ?

## 2022-02-15 NOTE — Progress Notes (Signed)
Physical Therapy Treatment ?Patient Details ?Name: Alex Brandt ?MRN: [id number] ?DOB: Nov 08, 1975 ?Today's Date: 02/15/2022 ? ? ?History of Present Illness Pt admitted for R uni knee and is POD 0 at time of evaluation. ? ?  ?PT Comments  ? ? Pt is making good progress towards goals with ability to ambulate increased distance sufficient for home dc in addition to performing stair training. Pt still with slight dizziness with exertion, however improved from previous session. Pt more limited by pain this session. Will continue to progress. Plan for dc this afternoon and continue to recommend OP PT. Will need RW and BSC. RN aware.   ?Recommendations for follow up therapy are one component of a multi-disciplinary discharge planning process, led by the attending physician.  Recommendations may be updated based on patient status, additional functional criteria and insurance authorization. ? ?Follow Up Recommendations ? Outpatient PT ?  ?  ?Assistance Recommended at Discharge Frequent or constant Supervision/Assistance  ?Patient can return home with the following Help with stairs or ramp for entrance;A little help with walking and/or transfers;A little help with bathing/dressing/bathroom ?  ?Equipment Recommendations ? Rolling walker (2 wheels);BSC/3in1  ?  ?Recommendations for Other Services   ? ? ?  ?Precautions / Restrictions Precautions ?Precautions: Fall;Knee ?Precaution Booklet Issued: Yes (comment) ?Restrictions ?Weight Bearing Restrictions: Yes ?RLE Weight Bearing: Weight bearing as tolerated  ?  ? ?Mobility ? Bed Mobility ?Overal bed mobility: Needs Assistance ?Bed Mobility: Supine to Sit ?  ?  ?Supine to sit: Min guard ?  ?  ?General bed mobility comments: safe technique ?  ? ?Transfers ?Overall transfer level: Needs assistance ?Equipment used: Rolling walker (2 wheels) ?Transfers: Sit to/from Stand ?Sit to Stand: Supervision ?  ?  ?  ?  ?  ?General transfer comment: safe technique with use of RW ?   ? ?Ambulation/Gait ?Ambulation/Gait assistance: Min guard ?Gait Distance (Feet): 130 Feet ?Assistive device: Rolling walker (2 wheels) ?Gait Pattern/deviations: Step-through pattern ?  ?  ?  ?General Gait Details: ambulated with improved reciprocal gait pattern and safe technique. RW used. Increased B UE WBing due to pain ? ? ?Stairs ?Stairs: Yes ?Stairs assistance: Min guard ?Stair Management: Two rails, Step to pattern, Forwards ?Number of Stairs: 4 ?General stair comments: up/down with stairs with seated rest break required due to dizziness. railing used and educated that family will hold hands to provide assist due to no railing at home. Safe technique. Cues for sequencing ? ? ?Wheelchair Mobility ?  ? ?Modified Rankin (Stroke Patients Only) ?  ? ? ?  ?Balance Overall balance assessment: Needs assistance ?Sitting-balance support: Feet supported ?Sitting balance-Leahy Scale: Good ?  ?  ?Standing balance support: Bilateral upper extremity supported ?Standing balance-Leahy Scale: Fair ?  ?  ?  ?  ?  ?  ?  ?  ?  ?  ?  ?  ?  ? ?  ?Cognition Arousal/Alertness: Awake/alert ?Behavior During Therapy: [facility] for tasks assessed/performed ?Overall Cognitive Status: Within Functional Limits for tasks assessed ?  ?  ?  ?  ?  ?  ?  ?  ?  ?  ?  ?  ?  ?  ?  ?  ?General Comments: improved affect this session, more alert ?  ?  ? ?  ?Exercises Total Joint Exercises ?Goniometric ROM: Extension at 20 degrees ?Other Exercises ?Other Exercises: HEP given and reviewed with family including frequency and duration. ?Other Exercises: Performed supine ther-ex including AP, quad sets, SLRs, and hip abd/add.  10 reps with safe technique ? ?  ?General Comments   ?  ?  ? ?Pertinent Vitals/Pain Pain Assessment ?Pain Assessment: Faces ?Faces Pain Scale: Hurts even more ?Pain Location: R knee ?Pain Descriptors / Indicators: Operative site guarding ?Pain Intervention(s): Limited activity within patient's tolerance, Ice applied, Repositioned,  Premedicated before session  ? ? ?Home Living Family/patient expects to be discharged to:: Private residence ?Living Arrangements: Spouse/significant other;Children ?Available Help at Discharge: Family;Available 24 hours/day ?Type of Home: House ?Home Access: Stairs to enter ?Entrance Stairs-Rails: None ?Entrance Stairs-Number of Steps: 3 ?  ?Home Layout: One level ?Home Equipment: Rollator (4 wheels) ?Additional Comments: plans to perform stairs holding onto family members  ?  ?Prior Function    ?  ?  ?   ? ?PT Goals (current goals can now be found in the care plan section) Acute Rehab PT Goals ?Patient Stated Goal: to go home ?PT Goal Formulation: With patient ?Time For Goal Achievement: 03/01/22 ?Potential to Achieve Goals: Good ?Progress towards PT goals: Progressing toward goals ? ?  ?Frequency ? ? ? BID ? ? ? ?  ?PT Plan Current plan remains appropriate  ? ? ?Co-evaluation   ?  ?  ?  ?  ? ?  ?AM-PAC PT "6 Clicks" Mobility   ?Outcome Measure ? Help needed turning from your back to your side while in a flat bed without using bedrails?: A Little ?Help needed moving from lying on your back to sitting on the side of a flat bed without using bedrails?: A Little ?Help needed moving to and from a bed to a chair (including a wheelchair)?: A Little ?Help needed standing up from a chair using your arms (e.g., wheelchair or bedside chair)?: A Little ?Help needed to walk in hospital room?: A Little ?Help needed climbing 3-5 steps with a railing? : A Little ?6 Click Score: 18 ? ?  ?End of Session Equipment Utilized During Treatment: Gait belt ?Activity Tolerance: Patient limited by pain ?Patient left: in bed;with family/visitor present ?Nurse Communication: Mobility status ?PT Visit Diagnosis: Muscle weakness (generalized) (M62.81);Difficulty in walking, not elsewhere classified (R26.2);Pain ?Pain - Right/Left: Right ?Pain - part of body: Leg ?  ? ? ?Time: 6314-9702 ?PT Time Calculation (min) (ACUTE ONLY): 17  min ? ?Charges:  $Gait Training: 8-22 mins ?$Therapeutic Exercise: 8-22 mins          ?          ? ?Elizabeth Palau, PT, DPT, GCS ?[phone] ? ? ? ?Alex Brandt ?02/15/2022, 4:37 PM ? ?

## 2022-02-15 NOTE — TOC Progression Note (Addendum)
Transition of Care (TOC) - Progression Note  ? ? ?Patient Details  ?Name: Alex Brandt ?MRN: [id number] ?Date of Birth: 1976/09/11 ? ?Transition of Care (TOC) CM/SW Contact  ?Marlowe Sax, RN ?Phone Number: ?02/15/2022, 3:04 PM ? ?Clinical Narrative:    ? ?Unable to get [facility] services due to Ins, no HH agency accepting, He will need a RW, [employer] to deliver at the bedside ? ?He is set up with Outpatient PT on Friday ? ?  ?  ? ?Expected Discharge Plan and Services ?  ?  ?  ?  ?  ?Expected Discharge Date: 02/15/22               ?  ?  ?  ?  ?  ?  ?  ?  ?  ?  ? ? ?Social Determinants of Health (SDOH) Interventions ?  ? ?Readmission Risk Interventions ?   ? View : No data to display.  ?  ?  ?  ? ? ?

## 2022-02-15 NOTE — Anesthesia Procedure Notes (Addendum)
Procedure Name: Intubation ?Date/Time: 02/15/2022 10:38 AM ?Performed by: Berniece Pap, CRNA ?Pre-anesthesia Checklist: Patient identified, Emergency Drugs available, Suction available and Patient being monitored ?Patient Re-evaluated:Patient Re-evaluated prior to induction ?Oxygen Delivery Method: Circle system utilized ?Preoxygenation: Pre-oxygenation with 100% oxygen ?Induction Type: IV induction ?Ventilation: Mask ventilation without difficulty ?Laryngoscope Size: McGraph and 4 ?Grade View: Grade I ?Tube type: Oral ?Tube size: 7.5 mm ?Number of attempts: 1 ?Airway Equipment and Method: Stylet and Oral airway ?Placement Confirmation: ETT inserted through vocal cords under direct vision, positive ETCO2 and breath sounds checked- equal and bilateral ?Secured at: 23 cm ?Tube secured with: Tape ?Dental Injury: Teeth and Oropharynx as per pre-operative assessment  ? ? ? ? ?

## 2022-02-15 NOTE — Progress Notes (Cosign Needed)
Patient is not able to walk the distance required to go the bathroom, or he/she is unable to safely negotiate stairs required to access the bathroom.  A 3in1 BSC will alleviate this problem  

## 2022-02-16 ENCOUNTER — Encounter: Payer: Self-pay | Admitting: Surgery

## 2022-02-17 ENCOUNTER — Emergency Department
Admission: EM | Admit: 2022-02-17 | Discharge: 2022-02-17 | Disposition: A | Discharge status: Home / Self Care | Payer: Medicaid Other | Attending: Emergency Medicine | Admitting: Emergency Medicine

## 2022-02-17 ENCOUNTER — Emergency Department: Payer: Medicaid Other

## 2022-02-17 ENCOUNTER — Encounter: Payer: Self-pay | Admitting: Emergency Medicine

## 2022-02-17 DIAGNOSIS — G8918 Other acute postprocedural pain: Secondary | ICD-10-CM | POA: Insufficient documentation

## 2022-02-17 DIAGNOSIS — I2510 Atherosclerotic heart disease of native coronary artery without angina pectoris: Secondary | ICD-10-CM | POA: Insufficient documentation

## 2022-02-17 DIAGNOSIS — I10 Essential (primary) hypertension: Secondary | ICD-10-CM | POA: Diagnosis not present

## 2022-02-17 DIAGNOSIS — M25561 Pain in right knee: Secondary | ICD-10-CM | POA: Insufficient documentation

## 2022-02-17 DIAGNOSIS — Z7901 Long term (current) use of anticoagulants: Secondary | ICD-10-CM | POA: Diagnosis not present

## 2022-02-17 DIAGNOSIS — Z96651 Presence of right artificial knee joint: Secondary | ICD-10-CM | POA: Diagnosis not present

## 2022-02-17 DIAGNOSIS — M79604 Pain in right leg: Secondary | ICD-10-CM | POA: Insufficient documentation

## 2022-02-17 LAB — BASIC METABOLIC PANEL
Anion gap: 4 — ABNORMAL LOW (ref 5–15)
BUN: 14 mg/dL (ref 6–20)
CO2: 26 mmol/L (ref 22–32)
Calcium: 8.5 mg/dL — ABNORMAL LOW (ref 8.9–10.3)
Chloride: 109 mmol/L (ref 98–111)
Creatinine, Ser: 1.29 mg/dL — ABNORMAL HIGH (ref 0.61–1.24)
GFR, Estimated: >60 mL/min (ref >60)
Glucose, Bld: 113 mg/dL — ABNORMAL HIGH (ref 70–99)
Potassium: 3.8 mmol/L (ref 3.5–5.1)
Sodium: 139 mmol/L (ref 135–145)

## 2022-02-17 LAB — CBC WITH DIFFERENTIAL/PLATELET
Abs Immature Granulocytes: 0.05 10*3/uL (ref 0.00–0.07)
Basophils Absolute: 0.1 10*3/uL (ref 0.0–0.1)
Basophils Relative: 1 %
Eosinophils Absolute: 0.1 10*3/uL (ref 0.0–0.5)
Eosinophils Relative: 1 %
HCT: 43 % (ref 39.0–52.0)
Hemoglobin: 14 g/dL (ref 13.0–17.0)
Immature Granulocytes: 1 %
Lymphocytes Relative: 22 %
Lymphs Abs: 2.2 10*3/uL (ref 0.7–4.0)
MCH: 30.1 pg (ref 26.0–34.0)
MCHC: 32.6 g/dL (ref 30.0–36.0)
MCV: 92.5 fL (ref 80.0–100.0)
Monocytes Absolute: 0.7 10*3/uL (ref 0.1–1.0)
Monocytes Relative: 7 %
Neutro Abs: 7.1 10*3/uL (ref 1.7–7.7)
Neutrophils Relative %: 68 %
Platelets: 180 10*3/uL (ref 150–400)
RBC: 4.65 MIL/uL (ref 4.22–5.81)
RDW: 13.5 % (ref 11.5–15.5)
WBC: 10.2 10*3/uL (ref 4.0–10.5)
nRBC: 0 % (ref 0.0–0.2)

## 2022-02-17 LAB — LACTIC ACID, PLASMA: Lactic Acid, Venous: 1.1 mmol/L (ref 0.5–1.9)

## 2022-02-17 LAB — PROCALCITONIN: Procalcitonin: <0.1 ng/mL

## 2022-02-17 MED ORDER — KETOROLAC TROMETHAMINE 30 MG/ML IJ SOLN
30.0000 mg | Freq: Once | INTRAMUSCULAR | Status: AC
  Administered 2022-02-17: 30 mg via INTRAVENOUS
  Filled 2022-02-17: qty 1

## 2022-02-17 MED ORDER — ONDANSETRON 4 MG PO TBDP: 4.0000 mg | ORAL_TABLET | Freq: Four times a day (QID) | ORAL | 0 refills | Status: DC | PRN

## 2022-02-17 NOTE — ED Provider Notes (Signed)
Vitals:  ? 02/17/22 0730 02/17/22 0800  ?BP: 129/86 134/76  ?Pulse: 72 69  ?Resp: 16   ?Temp:    ?SpO2: 98% 98%  ? ? ? ?Patient requesting to be discharged and follow-up with orthopedics.  His procalcitonin has resulted as normal.  He does on exam I do not see evidence of significant erythema swelling or drainage other than mild edema around the right knee joint.  I certainly do not see anything that would suggest be highly suspect of superinfection. ? ?I have sent messaging to Dr. Joice Lofts as well as paged him twice, and has not yet responded.  However my understanding from discussion with Dr. Elesa Massed was that Dr. Joice Lofts has been message informing him.  Once Dr. Joice Lofts comes out of the OR I do plan to communicating with him regarding the patient's presentation and need for close follow-up. ? ?Return precautions and treatment recommendations and follow-up discussed with the patient who is agreeable with the plan. ? ?  ?Sharyn Creamer, MD ?02/17/22 [phone] ? ?

## 2022-02-17 NOTE — ED Provider Notes (Signed)
? ?[facility] ?Provider Note ? ? ? Event Date/Time  ? First MD Initiated Contact with Patient 02/17/22 [phone]   ?  (approximate) ? ? ?History  ? ?Post-op Problem ? ? ?HPI ? ?Alex Brandt is a 46 y.o. male with history of CAD, hypertension, hyperlipidemia, recent right unicondylar knee arthroplasty by Dr. Joice Lofts on 02/15/2022 for recurrent medial meniscal tear with underlying degenerative joint disease who presents emergency department complaints of increased pain and swelling in the right knee.  States he has been keeping it elevated, applying ice.  He has been alternating Tylenol and ibuprofen for pain control.  He declines any narcotic pain medication as he states that they make him feel very sick.  He denies any fevers, bleeding or drainage from the wound.  He states that his pain level has not been well controlled.  He states that he felt like the level of swelling was not normal.  No calf tenderness or calf swelling.  No numbness or tingling. ? ? ?History provided by patient and significant other. ? ? ? ?Past Medical History:  ?Diagnosis Date  ? Angina at rest [facility])   ? Arthritis   ? Coronary artery disease 05/05/2021  ? a.) LHC 05/05/2021: EF 55-65%; normal LV function and LVEDP; 50% mLAD, 50% D1, 50% p-mRCA; med mgmt.  ? DJD (degenerative joint disease) of knee   ? Dyspnea   ? GERD (gastroesophageal reflux disease)   ? HLD (hyperlipidemia)   ? Hyperkalemia   ? Hypertension   ? Migraines   ? Valvular insufficiency 12/18/2014  ? a.) TTE 12/18/2014: EF >55%; mild PR, moderate MR/TR.  ? ? ?Past Surgical History:  ?Procedure Laterality Date  ? APPENDECTOMY    ? CARDIAC CATHETERIZATION    ? CHOLECYSTECTOMY    ? COLONOSCOPY    ? ESOPHAGOGASTRODUODENOSCOPY    ? KNEE ARTHROSCOPY WITH MEDIAL MENISECTOMY Right 08/30/2021  ? Procedure: Right knee arthroscopic partial medial meniscectomy;  Surgeon: Signa Kell, MD;  Location: [facility] ORS;  Service: Orthopedics;  Laterality: Right;  ? KNEE SURGERY  Right   ? meniscus repair in [facility] and again at [facility]  ? LEFT HEART CATH AND CORONARY ANGIOGRAPHY Left 05/05/2021  ? Procedure: LEFT HEART CATH AND CORONARY ANGIOGRAPHY;  Surgeon: Alwyn Pea, MD;  Location: [facility] INVASIVE CV LAB;  Service: Cardiovascular;  Laterality: Left;  ? PARTIAL KNEE ARTHROPLASTY Right 02/15/2022  ? Procedure: UNICOMPARTMENTAL KNEE;  Surgeon: Christena Flake, MD;  Location: [facility] ORS;  Service: Orthopedics;  Laterality: Right;  ? ? ?MEDICATIONS:  ?Prior to Admission medications   ?Medication Sig Start Date End Date Taking? Authorizing Provider  ?apixaban (ELIQUIS) 2.5 MG TABS tablet Take 1 tablet (2.5 mg total) by mouth 2 (two) times daily. 02/16/22  Yes Poggi, Excell Seltzer, MD  ?diltiazem (CARDIZEM CD) 120 MG 24 hr capsule Take 120 mg by mouth every evening. 07/22/21  Yes [provider]  ?omeprazole (PRILOSEC) 20 MG capsule Take 20 mg by mouth every morning.   Yes [provider]  ?oxyCODONE (ROXICODONE) 5 MG immediate release tablet Take 1-2 tablets (5-10 mg total) by mouth every 4 (four) hours as needed for moderate pain or severe pain. 02/15/22  Yes Poggi, Excell Seltzer, MD  ?pravastatin (PRAVACHOL) 40 MG tablet Take 40 mg by mouth every evening.   Yes [provider]  ?nitroGLYCERIN (NITROSTAT) 0.4 MG SL tablet Place 0.4 mg under the tongue every 5 (five) minutes as needed for chest pain. 04/22/21   [provider]  ? ? ?Physical Exam  ? ?Triage Vital Signs: ?ED Triage Vitals [02/17/22 0428]  ?Enc Vitals Group  ?   BP 139/80  ?   Pulse Rate 84  ?   Resp 18  ?   Temp 98 ?F (36.7 ?C)  ?   Temp Source Oral  ?   SpO2 98 %  ?   Weight 212 lb 1.3 oz (96.2 kg)  ?   Height 6\' 4"  (1.93 m)  ?   Head Circumference   ?   Peak Flow   ?   Pain Score   ?   Pain Loc   ?   Pain Edu?   ?   Excl. in GC?   ? ? ?Most recent vital signs: ?Vitals:  ? 02/17/22 0630 02/17/22 0700  ?BP: 131/73 122/80  ?Pulse: 70 70  ?Resp: 15   ?Temp:    ?SpO2: 98% 99%  ? ? ?CONSTITUTIONAL: Alert and  oriented and responds appropriately to questions. Well-appearing; well-nourished ?HEAD: Normocephalic, atraumatic ?EYES: Conjunctivae clear, pupils appear equal, sclera nonicteric ?ENT: normal nose; moist mucous membranes ?NECK: Supple, normal ROM ?CARD: RRR; S1 and S2 appreciated; no murmurs, no clicks, no rubs, no gallops ?RESP: Normal chest excursion without splinting or tachypnea; breath sounds clear and equal bilaterally; no wheezes, no rhonchi, no rales, no hypoxia or respiratory distress, speaking full sentences ?ABD/GI: Normal bowel sounds; non-distended; soft, non-tender, no rebound, no guarding, no peritoneal signs ?BACK: The back appears normal ?EXT: Surgical incision site to the anterior right knee is clean, dry and intact without bleeding or drainage.  He has mild warmth around the anterior knee but no significant redness.  No calf tenderness or calf swelling.  Compartments are soft.  2+ right DP pulse.  Normal sensation throughout the right lower extremity.  Normal capillary refill.  Small to moderate joint effusion.  No significant soft tissue swelling. ?SKIN: Normal color for age and race; warm; no rash on exposed skin ?NEURO: Moves all extremities equally, normal speech ?PSYCH: The patient's mood and manner are appropriate. ? ? ? ? ?RIGHT leg ? ? ?Patient gave verbal permission to utilize photo for medical documentation only. The image was not stored on any personal device. ? ? ?ED Results / Procedures / Treatments  ? ?LABS: ?(all labs ordered are listed, but only abnormal results are displayed) ?Labs Reviewed  ?BASIC METABOLIC PANEL - Abnormal; Notable for the following components:  ?    Result Value  ? Glucose, Bld 113 (*)   ? Creatinine, Ser 1.29 (*)   ? Calcium 8.5 (*)   ? Anion gap 4 (*)   ? All other components within normal limits  ?CBC WITH DIFFERENTIAL/PLATELET  ?LACTIC ACID, PLASMA  ?PROCALCITONIN  ? ? ? ?EKG: ? ? ?RADIOLOGY: ?My personal review and interpretation of imaging: X-ray shows  moderate joint effusion without significant change.  No other acute abnormality.  Ultrasound shows no DVT. ? ?I have personally reviewed all radiology reports.   ?DG Knee 2 Views Right ? ?Result Date: 02/17/2022 ?CLINICAL DATA:  Increased knee pain and swelling after recent replacement EXAM: RIGHT KNEE - 1-2 VIEW COMPARISON:  Two days ago FINDINGS: Medial unicompartmental arthroplasty which is located. Continued knee joint effusion and gas. Regional soft tissue swelling anteriorly, similar to before and expected after recent surgery. IMPRESSION: Unchanged recently postoperative right knee. Electronically Signed   By: Tiburcio PeaJonathan  Watts M.D.   On: 02/17/2022 05:32  ? ?US Venous Img Lower Unilateral Right ? ?  Result Date: 02/17/2022 ?CLINICAL DATA:  Right lower extremity pain and swelling. Two days postop. EXAM: RIGHT LOWER EXTREMITY VENOUS DOPPLER ULTRASOUND TECHNIQUE: Gray-scale sonography with compression, as well as color and duplex ultrasound, were performed to evaluate the deep venous system(s) from the level of the common femoral vein through the popliteal and proximal calf veins. COMPARISON:  None Available. FINDINGS: VENOUS Normal compressibility of the common femoral, superficial femoral, and popliteal veins, as well as the visualized calf veins. Visualized portions of profunda femoral vein and great saphenous vein unremarkable. No filling defects to suggest DVT on grayscale or color Doppler imaging. Doppler waveforms show normal direction of venous flow, normal respiratory plasticity and response to augmentation. Limited views of the contralateral common femoral vein are unremarkable. OTHER None. Limitations: none IMPRESSION: Negative. Electronically Signed   By: Signa Kell M.D.   On: 02/17/2022 06:30   ? ? ?PROCEDURES: ? ?Critical Care performed: No ? ? ? ?Procedures ? ? ? ?IMPRESSION / MDM / ASSESSMENT AND PLAN / ED COURSE  ?I reviewed the triage vital signs and the nursing notes. ? ? ? ?Patient here  complains of increased swelling and pain in the right knee status post unicondylar right knee arthroplasty 2 days ago. ? ?The patient is on the cardiac monitor to evaluate for evidence of arrhythmia and/or signifi

## 2022-02-17 NOTE — ED Triage Notes (Signed)
Pt had right knee replacement on 5/9 and pt to ED this AM due to increased swelling, heat and pain to right knee radiating downward to foot. Pt began Eliquis on 5/10.  ?

## 2022-02-17 NOTE — ED Notes (Signed)
Ice pack applied to right knee and right lower leg. ? ?

## 2022-02-17 NOTE — ED Provider Notes (Signed)
I was able to discuss the patient's presentation with Dr. Joice Lofts.  Reviewed his evaluation and labs with orthopedics, Dr. Joice Lofts will arrange for close outpatient follow-up ?  ?Sharyn Creamer, MD ?02/17/22 1146 ? ?

## 2022-02-17 NOTE — Discharge Instructions (Signed)
I recommend continuing to keep your leg elevated when at rest, applying ice multiple times a day and wearing your compression stockings which will help with pain and swelling. ? ?You may continue your ibuprofen as prescribed.  I recommend you take this medicine with food.  I also recommend that she start taking oxycodone to help with pain control.  You may break your tablets in half and take 2.5 mg as needed.  I have sent a prescription of nausea medicine to your pharmacy that you may take 15 to 20 minutes before taking a oxycodone.  If you do begin taking oxycodone, I recommend that you take a stool softener daily to prevent constipation. ? ?Your lab work was reassuring today without any sign of infection.  X-ray showed no large effusion in your knee and ultrasound showed no blood clot.  Please continue your Eliquis as prescribed. ?

## 2022-06-02 ENCOUNTER — Other Ambulatory Visit: Payer: Self-pay

## 2022-06-02 ENCOUNTER — Encounter
Admission: EM | Disposition: A | Discharge status: Home / Self Care | Payer: Self-pay | Source: Home / Self Care | Attending: Internal Medicine

## 2022-06-02 ENCOUNTER — Encounter: Payer: Self-pay | Admitting: Emergency Medicine

## 2022-06-02 ENCOUNTER — Inpatient Hospital Stay
Admit: 2022-06-02 | Discharge: 2022-06-02 | Disposition: A | Discharge status: Home / Self Care | Payer: Medicaid Other | Attending: Internal Medicine | Admitting: Internal Medicine

## 2022-06-02 ENCOUNTER — Inpatient Hospital Stay
Admission: EM | Admit: 2022-06-02 | Discharge: 2022-06-03 | DRG: 247 | Disposition: A | Discharge status: Home / Self Care | Payer: Medicaid Other | Attending: Internal Medicine | Admitting: Internal Medicine

## 2022-06-02 DIAGNOSIS — Z9049 Acquired absence of other specified parts of digestive tract: Secondary | ICD-10-CM

## 2022-06-02 DIAGNOSIS — I213 ST elevation (STEMI) myocardial infarction of unspecified site: Principal | ICD-10-CM

## 2022-06-02 DIAGNOSIS — Z7982 Long term (current) use of aspirin: Secondary | ICD-10-CM

## 2022-06-02 DIAGNOSIS — I2510 Atherosclerotic heart disease of native coronary artery without angina pectoris: Secondary | ICD-10-CM | POA: Diagnosis present

## 2022-06-02 DIAGNOSIS — I10 Essential (primary) hypertension: Secondary | ICD-10-CM | POA: Diagnosis present

## 2022-06-02 DIAGNOSIS — K219 Gastro-esophageal reflux disease without esophagitis: Secondary | ICD-10-CM | POA: Diagnosis present

## 2022-06-02 DIAGNOSIS — Z96651 Presence of right artificial knee joint: Secondary | ICD-10-CM | POA: Diagnosis present

## 2022-06-02 DIAGNOSIS — F17210 Nicotine dependence, cigarettes, uncomplicated: Secondary | ICD-10-CM | POA: Diagnosis present

## 2022-06-02 DIAGNOSIS — M1990 Unspecified osteoarthritis, unspecified site: Secondary | ICD-10-CM | POA: Diagnosis present

## 2022-06-02 DIAGNOSIS — F17200 Nicotine dependence, unspecified, uncomplicated: Secondary | ICD-10-CM | POA: Diagnosis present

## 2022-06-02 DIAGNOSIS — E785 Hyperlipidemia, unspecified: Secondary | ICD-10-CM | POA: Diagnosis present

## 2022-06-02 DIAGNOSIS — Z885 Allergy status to narcotic agent status: Secondary | ICD-10-CM

## 2022-06-02 DIAGNOSIS — Z7901 Long term (current) use of anticoagulants: Secondary | ICD-10-CM | POA: Diagnosis not present

## 2022-06-02 DIAGNOSIS — I959 Hypotension, unspecified: Secondary | ICD-10-CM | POA: Diagnosis present

## 2022-06-02 DIAGNOSIS — Z91013 Allergy to seafood: Secondary | ICD-10-CM | POA: Diagnosis not present

## 2022-06-02 DIAGNOSIS — Z79899 Other long term (current) drug therapy: Secondary | ICD-10-CM | POA: Diagnosis not present

## 2022-06-02 DIAGNOSIS — I2111 ST elevation (STEMI) myocardial infarction involving right coronary artery: Secondary | ICD-10-CM | POA: Diagnosis not present

## 2022-06-02 DIAGNOSIS — I2119 ST elevation (STEMI) myocardial infarction involving other coronary artery of inferior wall: Principal | ICD-10-CM | POA: Diagnosis present

## 2022-06-02 DIAGNOSIS — M1710 Unilateral primary osteoarthritis, unspecified knee: Secondary | ICD-10-CM | POA: Diagnosis present

## 2022-06-02 DIAGNOSIS — Z20822 Contact with and (suspected) exposure to covid-19: Secondary | ICD-10-CM | POA: Diagnosis present

## 2022-06-02 HISTORY — PX: LEFT HEART CATH AND CORONARY ANGIOGRAPHY: CATH118249

## 2022-06-02 HISTORY — PX: CORONARY/GRAFT ACUTE MI REVASCULARIZATION: CATH118305

## 2022-06-02 LAB — COMPREHENSIVE METABOLIC PANEL
ALT: 19 U/L (ref 0–44)
AST: 23 U/L (ref 15–41)
Albumin: 4 g/dL (ref 3.5–5.0)
Alkaline Phosphatase: 87 U/L (ref 38–126)
Anion gap: 8 (ref 5–15)
BUN: 17 mg/dL (ref 6–20)
CO2: 24 mmol/L (ref 22–32)
Calcium: 8.8 mg/dL — ABNORMAL LOW (ref 8.9–10.3)
Chloride: 106 mmol/L (ref 98–111)
Creatinine, Ser: 1.39 mg/dL — ABNORMAL HIGH (ref 0.61–1.24)
GFR, Estimated: >60 mL/min (ref >60)
Glucose, Bld: 135 mg/dL — ABNORMAL HIGH (ref 70–99)
Potassium: 3.8 mmol/L (ref 3.5–5.1)
Sodium: 138 mmol/L (ref 135–145)
Total Bilirubin: 0.5 mg/dL (ref 0.3–1.2)
Total Protein: 7.8 g/dL (ref 6.5–8.1)

## 2022-06-02 LAB — CBC WITH DIFFERENTIAL/PLATELET
Abs Immature Granulocytes: 0.04 10*3/uL (ref 0.00–0.07)
Basophils Absolute: 0.1 10*3/uL (ref 0.0–0.1)
Basophils Relative: 1 %
Eosinophils Absolute: 0.1 10*3/uL (ref 0.0–0.5)
Eosinophils Relative: 1 %
HCT: 41 % (ref 39.0–52.0)
Hemoglobin: 13.6 g/dL (ref 13.0–17.0)
Immature Granulocytes: 0 %
Lymphocytes Relative: 13 %
Lymphs Abs: 1.4 10*3/uL (ref 0.7–4.0)
MCH: 28.8 pg (ref 26.0–34.0)
MCHC: 33.2 g/dL (ref 30.0–36.0)
MCV: 86.7 fL (ref 80.0–100.0)
Monocytes Absolute: 0.6 10*3/uL (ref 0.1–1.0)
Monocytes Relative: 6 %
Neutro Abs: 9 10*3/uL — ABNORMAL HIGH (ref 1.7–7.7)
Neutrophils Relative %: 79 %
Platelets: 207 10*3/uL (ref 150–400)
RBC: 4.73 MIL/uL (ref 4.22–5.81)
RDW: 13.5 % (ref 11.5–15.5)
WBC: 11.2 10*3/uL — ABNORMAL HIGH (ref 4.0–10.5)
nRBC: 0 % (ref 0.0–0.2)

## 2022-06-02 LAB — ECHOCARDIOGRAM COMPLETE
AR max vel: 1.74 cm2
AV Area VTI: 2.29 cm2
AV Area mean vel: 1.73 cm2
AV Mean grad: 2 mmHg
AV Peak grad: 4 mmHg
Ao pk vel: 1 m/s
Area-P 1/2: 2.22 cm2
Height: 76 in
S' Lateral: 3.2 cm
Weight: 3421.54 oz

## 2022-06-02 LAB — LIPID PANEL
Cholesterol: 178 mg/dL (ref 0–200)
HDL: 45 mg/dL (ref >40)
LDL Cholesterol: 124 mg/dL — ABNORMAL HIGH (ref 0–99)
Total CHOL/HDL Ratio: 4 RATIO
Triglycerides: 47 mg/dL (ref <150)
VLDL: 9 mg/dL (ref 0–40)

## 2022-06-02 LAB — TROPONIN I (HIGH SENSITIVITY)
Troponin I (High Sensitivity): 25 ng/L — ABNORMAL HIGH (ref <18)
Troponin I (High Sensitivity): 11187 ng/L (ref <18)

## 2022-06-02 LAB — HEMOGLOBIN A1C
Hgb A1c MFr Bld: 5.3 % (ref 4.8–5.6)
Mean Plasma Glucose: 105.41 mg/dL

## 2022-06-02 LAB — MRSA NEXT GEN BY PCR, NASAL: MRSA by PCR Next Gen: NOT DETECTED

## 2022-06-02 LAB — SARS CORONAVIRUS 2 BY RT PCR: SARS Coronavirus 2 by RT PCR: NEGATIVE

## 2022-06-02 LAB — APTT: aPTT: 27 seconds (ref 24–36)

## 2022-06-02 LAB — POCT ACTIVATED CLOTTING TIME
Activated Clotting Time: 708 seconds
Activated Clotting Time: 401 seconds

## 2022-06-02 LAB — PROTIME-INR
INR: 1 (ref 0.8–1.2)
Prothrombin Time: 12.8 seconds (ref 11.4–15.2)

## 2022-06-02 LAB — GLUCOSE, CAPILLARY: Glucose-Capillary: 110 mg/dL — ABNORMAL HIGH (ref 70–99)

## 2022-06-02 SURGERY — CORONARY/GRAFT ACUTE MI REVASCULARIZATION
Anesthesia: Moderate Sedation

## 2022-06-02 MED ORDER — SODIUM CHLORIDE 0.9 % IV SOLN: 250.0000 mL | INTRAVENOUS | Status: DC | PRN

## 2022-06-02 MED ORDER — TICAGRELOR 90 MG PO TABS
90.0000 mg | ORAL_TABLET | Freq: Two times a day (BID) | ORAL | Status: DC
  Administered 2022-06-02 – 2022-06-03 (×2): 90 mg via ORAL
  Filled 2022-06-02 (×3): qty 1

## 2022-06-02 MED ORDER — SODIUM CHLORIDE 0.9 % IV SOLN
INTRAVENOUS | Status: AC | PRN
  Administered 2022-06-02: 1000 mL via INTRAVENOUS

## 2022-06-02 MED ORDER — HYDRALAZINE HCL 20 MG/ML IJ SOLN: 10.0000 mg | INTRAMUSCULAR | Status: AC | PRN

## 2022-06-02 MED ORDER — SODIUM CHLORIDE 0.9 % IV SOLN
INTRAVENOUS | Status: AC | PRN
  Administered 2022-06-02: 1.75 mg/kg/h via INTRAVENOUS

## 2022-06-02 MED ORDER — CHLORHEXIDINE GLUCONATE CLOTH 2 % EX PADS
6.0000 | MEDICATED_PAD | Freq: Every day | CUTANEOUS | Status: DC
  Administered 2022-06-02: 6 via TOPICAL

## 2022-06-02 MED ORDER — FENTANYL CITRATE (PF) 100 MCG/2ML IJ SOLN
INTRAMUSCULAR | Status: AC
  Filled 2022-06-02: qty 2

## 2022-06-02 MED ORDER — SODIUM CHLORIDE 0.9 % WEIGHT BASED INFUSION
1.0000 mL/kg/h | INTRAVENOUS | Status: AC
  Administered 2022-06-02 (×2): 1 mL/kg/h via INTRAVENOUS

## 2022-06-02 MED ORDER — MIDAZOLAM HCL 2 MG/2ML IJ SOLN
INTRAMUSCULAR | Status: DC | PRN
  Administered 2022-06-02: 2 mg via INTRAVENOUS

## 2022-06-02 MED ORDER — SODIUM CHLORIDE 0.9% FLUSH
3.0000 mL | Freq: Two times a day (BID) | INTRAVENOUS | Status: DC
Start: 2022-06-02 — End: 2022-06-03
  Administered 2022-06-02 – 2022-06-03 (×2): 3 mL via INTRAVENOUS

## 2022-06-02 MED ORDER — TICAGRELOR 90 MG PO TABS
ORAL_TABLET | ORAL | Status: AC
  Filled 2022-06-02: qty 2

## 2022-06-02 MED ORDER — HEPARIN (PORCINE) IN NACL 1000-0.9 UT/500ML-% IV SOLN
INTRAVENOUS | Status: AC
  Filled 2022-06-02: qty 1000

## 2022-06-02 MED ORDER — PANTOPRAZOLE SODIUM 40 MG PO TBEC
40.0000 mg | DELAYED_RELEASE_TABLET | Freq: Every day | ORAL | Status: DC
  Administered 2022-06-02 – 2022-06-03 (×2): 40 mg via ORAL
  Filled 2022-06-02 (×3): qty 1

## 2022-06-02 MED ORDER — SODIUM CHLORIDE 0.9% FLUSH: 3.0000 mL | INTRAVENOUS | Status: DC | PRN

## 2022-06-02 MED ORDER — ORAL CARE MOUTH RINSE: 15.0000 mL | OROMUCOSAL | Status: DC | PRN

## 2022-06-02 MED ORDER — ACETAMINOPHEN 325 MG PO TABS
650.0000 mg | ORAL_TABLET | ORAL | Status: DC | PRN
Start: 2022-06-02 — End: 2022-06-03

## 2022-06-02 MED ORDER — BIVALIRUDIN BOLUS VIA INFUSION - CUPID
INTRAVENOUS | Status: DC | PRN
  Administered 2022-06-02: 73.35 mg via INTRAVENOUS

## 2022-06-02 MED ORDER — SODIUM CHLORIDE 0.9 % IV SOLN
0.2500 mg/kg/h | INTRAVENOUS | Status: AC
  Administered 2022-06-02: 0.25 mg/kg/h via INTRAVENOUS
  Filled 2022-06-02: qty 250

## 2022-06-02 MED ORDER — SODIUM CHLORIDE 0.9 % IV SOLN: INTRAVENOUS | Status: DC

## 2022-06-02 MED ORDER — NICOTINE 21 MG/24HR TD PT24
21.0000 mg | MEDICATED_PATCH | Freq: Every day | TRANSDERMAL | Status: DC
  Administered 2022-06-02 – 2022-06-03 (×2): 21 mg via TRANSDERMAL
  Filled 2022-06-02 (×2): qty 1

## 2022-06-02 MED ORDER — LOSARTAN POTASSIUM 50 MG PO TABS: 25.0000 mg | ORAL_TABLET | Freq: Every day | ORAL | Status: DC

## 2022-06-02 MED ORDER — ONDANSETRON HCL 4 MG/2ML IJ SOLN
INTRAMUSCULAR | Status: AC
  Filled 2022-06-02: qty 2

## 2022-06-02 MED ORDER — IOHEXOL 300 MG/ML  SOLN
INTRAMUSCULAR | Status: DC | PRN
  Administered 2022-06-02: 180 mL

## 2022-06-02 MED ORDER — ONDANSETRON HCL 4 MG/2ML IJ SOLN: 4.0000 mg | Freq: Four times a day (QID) | INTRAMUSCULAR | Status: DC | PRN

## 2022-06-02 MED ORDER — MIDAZOLAM HCL 2 MG/2ML IJ SOLN
INTRAMUSCULAR | Status: AC
  Filled 2022-06-02: qty 2

## 2022-06-02 MED ORDER — TICAGRELOR 90 MG PO TABS
ORAL_TABLET | ORAL | Status: DC | PRN
  Administered 2022-06-02: 180 mg via ORAL

## 2022-06-02 MED ORDER — ONDANSETRON HCL 4 MG/2ML IJ SOLN
INTRAMUSCULAR | Status: DC | PRN
  Administered 2022-06-02: 4 mg via INTRAVENOUS

## 2022-06-02 MED ORDER — HEPARIN SODIUM (PORCINE) 5000 UNIT/ML IJ SOLN
4000.0000 [IU] | Freq: Once | INTRAMUSCULAR | Status: AC
  Administered 2022-06-02: 4000 [IU] via INTRAVENOUS

## 2022-06-02 MED ORDER — BIVALIRUDIN TRIFLUOROACETATE 250 MG IV SOLR
INTRAVENOUS | Status: AC
  Filled 2022-06-02: qty 250

## 2022-06-02 MED ORDER — LIDOCAINE HCL 1 % IJ SOLN
INTRAMUSCULAR | Status: AC
  Filled 2022-06-02: qty 20

## 2022-06-02 MED ORDER — ROSUVASTATIN CALCIUM 10 MG PO TABS
40.0000 mg | ORAL_TABLET | Freq: Every evening | ORAL | Status: DC
  Administered 2022-06-02: 40 mg via ORAL
  Filled 2022-06-02: qty 4

## 2022-06-02 MED ORDER — LABETALOL HCL 5 MG/ML IV SOLN: 10.0000 mg | INTRAVENOUS | Status: AC | PRN

## 2022-06-02 MED ORDER — ASPIRIN 81 MG PO CHEW
81.0000 mg | CHEWABLE_TABLET | Freq: Every day | ORAL | Status: DC
  Administered 2022-06-03: 81 mg via ORAL
  Filled 2022-06-02: qty 1

## 2022-06-02 MED ORDER — ALPRAZOLAM 0.25 MG PO TABS: 0.2500 mg | ORAL_TABLET | Freq: Two times a day (BID) | ORAL | Status: DC | PRN

## 2022-06-02 MED ORDER — METOPROLOL SUCCINATE ER 25 MG PO TB24
12.5000 mg | ORAL_TABLET | Freq: Every day | ORAL | Status: DC
  Administered 2022-06-03: 12.5 mg via ORAL
  Filled 2022-06-02 (×2): qty 0.5

## 2022-06-02 MED ORDER — LIDOCAINE HCL (PF) 1 % IJ SOLN
INTRAMUSCULAR | Status: DC | PRN
  Administered 2022-06-02: 20 mL

## 2022-06-02 MED ORDER — FENTANYL CITRATE (PF) 100 MCG/2ML IJ SOLN
INTRAMUSCULAR | Status: DC | PRN
Start: 2022-06-02 — End: 2022-06-02
  Administered 2022-06-02: 50 ug via INTRAVENOUS

## 2022-06-02 SURGICAL SUPPLY — 16 items
BALLN TREK RX 2.5X15 (BALLOONS) ×1
BALLOON TREK RX 2.5X15 (BALLOONS) IMPLANT
CATH INFINITI 5FR MULTPACK ANG (CATHETERS) IMPLANT
CATH VISTA GUIDE 6FR JR4 (CATHETERS) IMPLANT
DEVICE CLOSURE MYNXGRIP 6/7F (Vascular Products) IMPLANT
NDL PERC 18GX7CM (NEEDLE) IMPLANT
NEEDLE PERC 18GX7CM (NEEDLE) ×1 IMPLANT
PACK CARDIAC CATH (CUSTOM PROCEDURE TRAY) ×1 IMPLANT
PROTECTION STATION PRESSURIZED (MISCELLANEOUS) ×1
SET ATX SIMPLICITY (MISCELLANEOUS) IMPLANT
SHEATH AVANTI 6FR X 11CM (SHEATH) IMPLANT
STATION PROTECTION PRESSURIZED (MISCELLANEOUS) IMPLANT
STENT ONYX FRONTIER 2.5X18 (Permanent Stent) IMPLANT
TUBING CIL FLEX 10 FLL-RA (TUBING) IMPLANT
WIRE G HI TQ BMW 190 (WIRE) IMPLANT
WIRE GUIDERIGHT .035X150 (WIRE) IMPLANT

## 2022-06-02 NOTE — Assessment & Plan Note (Signed)
Smoking cessation was discussed with patient in detail. We will place patient on nicotine transdermal patch 21 mg daily

## 2022-06-02 NOTE — Progress Notes (Signed)
*  PRELIMINARY RESULTS* Echocardiogram 2D Echocardiogram has been performed.  Cristela Blue 06/02/2022, 11:36 AM

## 2022-06-02 NOTE — TOC Initial Note (Signed)
Transition of Care [facility]) - Initial/Assessment Note    Patient Details  Name: Alex Brandt MRN: [id number] Date of Birth: [birth year]  Transition of Care [facility]) CM/SW Contact:    Allayne Butcher, RN Phone Number: 06/02/2022, 1:17 PM  Clinical Narrative:                  Transition of Care [facility]) Screening Note   Patient Details  Name: Alex Brandt Date of Birth: 11/29/1975   Transition of Care [facility]) CM/SW Contact:    Allayne Butcher, RN Phone Number: 06/02/2022, 1:18 PM    Transition of Care Department [facility]) has reviewed patient and no TOC needs have been identified at this time. We will continue to monitor patient advancement through interdisciplinary progression rounds. If new patient transition needs arise, please place a TOC consult.          Patient Goals and CMS Choice        Expected Discharge Plan and Services                                                Prior Living Arrangements/Services                       Activities of Daily Living Home Assistive Devices/Equipment: Eyeglasses, Dan Humphreys (specify type) ADL Screening (condition at time of admission) Patient's cognitive ability adequate to safely complete daily activities?: Yes Is the patient deaf or have difficulty hearing?: No Does the patient have difficulty seeing, even when wearing glasses/contacts?: No Does the patient have difficulty concentrating, remembering, or making decisions?: No Patient able to express need for assistance with ADLs?: Yes Does the patient have difficulty dressing or bathing?: No Independently performs ADLs?: Yes (appropriate for developmental age) Does the patient have difficulty walking or climbing stairs?: No Weakness of Legs: Right Weakness of Arms/Hands: None  Permission Sought/Granted                  Emotional Assessment              Admission diagnosis:  ST elevation myocardial infarction (STEMI), unspecified artery  (HCC) [I21.3] STEMI involving right coronary artery (HCC) [I21.11] Patient Active Problem List   Diagnosis Date Noted   STEMI involving right coronary artery (HCC) 06/02/2022   GERD (gastroesophageal reflux disease)    Hypertension    Nicotine dependence    Coronary artery disease 05/05/2021   PCP:  [facility] Pharmacy:   [facility] - [place], [place] - [address] [place] [address] Phone: [phone] Fax: [phone]     Social Determinants of Health (SDOH) Interventions    Readmission Risk Interventions     No data to display

## 2022-06-02 NOTE — ED Provider Notes (Signed)
[facility] Provider Note    Event Date/Time   First MD Initiated Contact with Patient 06/02/22 [phone]     (approximate)   History   Code STEMI   Level 5 caveat:  history/ROS limited by acute/critical illness   HPI  Alex Brandt is a 45 y.o. male who reports an extensive history of coronary artery disease who last had a catheterization about a year ago and whose cardiologist is Dr. Juliann Pares.  He presents by EMS for cute onset and severe chest pain and pressure that awoke him from sleep.  He has had some sweating.  No nausea or vomiting.  No shortness of breath.  Pain is severe.  Prior to arrival he had a full dose aspirin and has received 2 sublingual nitroglycerin tablets.  He said he actually feels a little bit worse than he did before, but he is not in visible distress at this moment.     Physical Exam   Triage Vital Signs: ED Triage Vitals  Enc Vitals Group     BP 06/02/22 0624 132/79     Pulse Rate 06/02/22 0624 76     Resp 06/02/22 0624 20     Temp 06/02/22 0624 97.7 F (36.5 C)     Temp Source 06/02/22 0624 Oral     SpO2 06/02/22 0624 100 %     Weight 06/02/22 0625 97.8 kg (215 lb 9.6 oz)     Height 06/02/22 0625 1.93 m (6\' 4" )     Head Circumference --      Peak Flow --      Pain Score 06/02/22 0626 8     Pain Loc --      Pain Edu? --      Excl. in GC? --     Most recent vital signs: Vitals:   06/02/22 0624  BP: 132/79  Pulse: 76  Resp: 20  Temp: 97.7 F (36.5 C)  SpO2: 100%     General: Awake, is uncomfortable but nontoxic appearance. CV:  Good peripheral perfusion.  Normal heart sounds.  Regular rate and rhythm Resp:  Normal effort.  Lungs clear to auscultation.  No accessory muscle usage. Abd:  No distention.  No tenderness to palpation the abdomen.  No abdominal bruit. Other:  Awake, alert, normal mood and affect under the circumstances, acting appropriately.   ED Results / Procedures / Treatments   Labs (all  labs ordered are listed, but only abnormal results are displayed) Labs Reviewed  SARS CORONAVIRUS 2 BY RT PCR  HEMOGLOBIN A1C  CBC WITH DIFFERENTIAL/PLATELET  PROTIME-INR  APTT  COMPREHENSIVE METABOLIC PANEL  LIPID PANEL  TROPONIN I (HIGH SENSITIVITY)     EKG  ED ECG REPORT #1 I, 06/04/22, the attending physician, personally viewed and interpreted this ECG.  Date: 06/02/2022 EKG Time: 6:23 AM Rate: 76 Rhythm: normal sinus rhythm QRS Axis: normal Intervals: normal ST/T Wave abnormalities: ST segment elevation most notable in leads III and aVF with ST depression and inverted T waves in aVL and V2.  Concerning for STEMI Narrative Interpretation: Probable inferior wall STEMI.  ED ECG REPORT #2 I, 06/04/2022, the attending physician, personally viewed and interpreted this ECG.  Date: 06/02/2022 EKG Time: 6:30 AM Rate: 82 Rhythm: normal sinus rhythm QRS Axis: normal Intervals: normal ST/T Wave abnormalities: ST segment elevation in leads II, 3, aVF, reciprocal changes in anterior leads and aVL Narrative Interpretation: Consistent with inferior STEMI   PROCEDURES:  Critical Care performed: Yes,  see critical care procedure note(s)  .1-3 Lead EKG Interpretation  Performed by: Loleta Rose, MD Authorized by: Loleta Rose, MD     Interpretation: normal     ECG rate:  75   ECG rate assessment: normal     Rhythm: sinus rhythm     Ectopy: none     Conduction: normal   .Critical Care  Performed by: Loleta Rose, MD Authorized by: Loleta Rose, MD   Critical care provider statement:    Critical care time (minutes):  30   Critical care time was exclusive of:  Separately billable procedures and treating other patients   Critical care was necessary to treat or prevent imminent or life-threatening deterioration of the following conditions:  Cardiac failure (STEMI)   Critical care was time spent personally by me on the following activities:  Development of treatment  plan with patient or surrogate, evaluation of patient's response to treatment, examination of patient, obtaining history from patient or surrogate, ordering and performing treatments and interventions, ordering and review of laboratory studies, ordering and review of radiographic studies, pulse oximetry, re-evaluation of patient's condition and review of old charts    MEDICATIONS ORDERED IN ED: Medications  0.9 %  sodium chloride infusion (has no administration in time range)  heparin injection 4,000 Units (4,000 Units Intravenous Given 06/02/22 0626)     IMPRESSION / MDM / ASSESSMENT AND PLAN / ED COURSE  I reviewed the triage vital signs and the nursing notes.                              Differential diagnosis includes, but is not limited to, ACS, AAS, pneumothorax, infectious process such as pneumonia.  Patient's presentation is most consistent with acute presentation with potential threat to life or bodily function.  The patient is on the cardiac monitor to evaluate for evidence of arrhythmia and/or significant heart rate changes.  Presentation is consistent with inferior STEMI with ST segment elevation in leads II, III, and aVF, with reciprocal changes in the anterior leads.  The code STEMI was called in by [facility], but they do not have the ability to transmit EKGs.  I reviewed the multiple strips that they obtained prior to hospital arrival and they are all consistent with STEMI.  We then immediately obtained a twelve-lead EKG in the ED.  The changes are not as pronounced as they were prehospital after he has received aspirin and 2 doses of nitroglycerin sublingual, but they are still diagnostic for STEMI.  I am calling Dr. Juliann Pares who is on STEMI call to discuss the case.  Dr. Juliann Pares is also the patient's primary cardiologist.  I used the STEMI order set to order lipid panel, high-sensitivity troponin, pro time-INR, APTT, CBC with differential, hemoglobin A1c,  comprehensive metabolic panel, COVID-19 PCR.  Patient has already received aspirin and nitroglycerin.  Vital signs are stable.  I ordered heparin 4000 units IV bolus.   Clinical Course as of 06/02/22 0649  Thu Jun 02, 2022  0628 Consulted by phone with Dr. Juliann Pares.  We discussed the case and my concern that it is in fact an acute STEMI.  He will come to the ED to evaluate the patient in person. [CF]  [phone] Discussed case in person with Dr. Juliann Pares after he evaluated the patient in the emergency department.  He is taking the patient to the Cath Lab for treatment of acute STEMI.  Patient hemodynamically stable, awake  and alert for transport. [CF]    Clinical Course User Index [CF] Hinda Kehr, MD     FINAL CLINICAL IMPRESSION(S) / ED DIAGNOSES   Final diagnoses:  ST elevation myocardial infarction (STEMI), unspecified artery ([place])     Rx / DC Orders   ED Discharge Orders     None        Note:  This document was prepared using Dragon voice recognition software and may include unintentional dictation errors.   Hinda Kehr, MD 06/02/22 [phone]

## 2022-06-02 NOTE — H&P (Signed)
History and Physical    Patient: Alex Brandt YQM:[id number] DOB: 15-May-1976 DOA: 06/02/2022 DOS: the patient was seen and examined on 06/02/2022 PCP: [facility]  Patient coming from: Home  Chief Complaint:  Chief Complaint  Patient presents with   Code STEMI   HPI: Alex Brandt is a 46 y.o. male with medical history significant for coronary artery disease, nicotine dependence, hypertension, dyslipidemia who presented to the ER via EMS for evaluation of chest pain. Patient states symptoms started the night prior to his admission and was mostly in his left axilla with radiation down his left arm associated with nausea and diaphoresis.  He rated his pain a 10 x 10 in intensity at its worst.  He took a BC powder with no improvement in his pain and he took 2 sublingual nitroglycerin tablets which did not provide any relief.  He called EMS and was brought into the ER as a code STEMI. He denies having any cough, no fever, no chills, no abdominal pain, no changes in his bowel habits, no headache, no dizziness, no lightheadedness, no blurred vision or focal deficit. Patient was taken emergently to the Cath Lab and had a left heart cath done     Review of Systems: As mentioned in the history of present illness. All other systems reviewed and are negative. Past Medical History:  Diagnosis Date   Angina at rest [facility])    Arthritis    Coronary artery disease 05/05/2021   a.) LHC 05/05/2021: EF 55-65%; normal LV function and LVEDP; 50% mLAD, 50% D1, 50% p-mRCA; med mgmt.   DJD (degenerative joint disease) of knee    Dyspnea    GERD (gastroesophageal reflux disease)    HLD (hyperlipidemia)    Hyperkalemia    Hypertension    Migraines    Valvular insufficiency 12/18/2014   a.) TTE 12/18/2014: EF >55%; mild PR, moderate MR/TR.   Past Surgical History:  Procedure Laterality Date   APPENDECTOMY     CARDIAC CATHETERIZATION     CHOLECYSTECTOMY      COLONOSCOPY     ESOPHAGOGASTRODUODENOSCOPY     KNEE ARTHROSCOPY WITH MEDIAL MENISECTOMY Right 08/30/2021   Procedure: Right knee arthroscopic partial medial meniscectomy;  Surgeon: Leim Fabry, MD;  Location: [facility] ORS;  Service: Orthopedics;  Laterality: Right;   KNEE SURGERY Right    meniscus repair in [place] and again at [place] Left 05/05/2021   Procedure: LEFT HEART CATH AND CORONARY ANGIOGRAPHY;  Surgeon: Yolonda Kida, MD;  Location: [place] [facility];  Service: Cardiovascular;  Laterality: Left;   PARTIAL KNEE ARTHROPLASTY Right 02/15/2022   Procedure: UNICOMPARTMENTAL KNEE;  Surgeon: Corky Mull, MD;  Location: [facility] ORS;  Service: Orthopedics;  Laterality: Right;   Social History:  reports that he has been smoking cigarettes. He has a 10.00 pack-year smoking history. He has never used smokeless tobacco. He reports that he does not currently use alcohol. He reports that he does not use drugs.  Allergies  Allergen Reactions   Shellfish Allergy Anaphylaxis   Hydrocodone Nausea And Vomiting   Tramadol Other (See Comments)    Migraines    History reviewed. No pertinent family history.  Prior to Admission medications   Medication Sig Start Date End Date Taking? Authorizing Provider  aspirin EC 81 MG tablet Take 81 mg by mouth daily. Swallow whole.   Yes [provider]  diltiazem (CARDIZEM CD) 120 MG 24  hr capsule Take 120 mg by mouth every evening. 07/22/21  Yes [provider]  omeprazole (PRILOSEC) 20 MG capsule Take 20 mg by mouth every morning.   Yes [provider]  pravastatin (PRAVACHOL) 40 MG tablet Take 40 mg by mouth every evening.   Yes [provider]  apixaban (ELIQUIS) 2.5 MG TABS tablet Take 1 tablet (2.5 mg total) by mouth 2 (two) times daily. Patient not taking: Reported on 06/02/2022 02/16/22   Poggi, Marshall Cork, MD  ibuprofen (ADVIL) 800 MG tablet Take 800 mg by mouth 3 (three)  times daily. 03/01/22   [provider]  nitroGLYCERIN (NITROSTAT) 0.4 MG SL tablet Place 0.4 mg under the tongue every 5 (five) minutes as needed for chest pain. 04/22/21   [provider]  ondansetron (ZOFRAN-ODT) 4 MG disintegrating tablet Take 1 tablet (4 mg total) by mouth every 6 (six) hours as needed for nausea or vomiting. 02/17/22   Ward, Delice Bison, DO  oxyCODONE (ROXICODONE) 5 MG immediate release tablet Take 1-2 tablets (5-10 mg total) by mouth every 4 (four) hours as needed for moderate pain or severe pain. 02/15/22   Corky Mull, MD    Physical Exam: Vitals:   06/02/22 0830 06/02/22 0845 06/02/22 0900 06/02/22 0915  BP:  116/66 110/73 104/72  Pulse: 65 73 72 65  Resp: _0 Temp:      TempSrc:      SpO2: 99% 99% 100% 100%  Weight:      Height:       Physical Exam Vitals and nursing note reviewed.  Constitutional:      Appearance: Normal appearance.     Comments: Looks older than stated age  HENT:     Head: Normocephalic and atraumatic.     Nose: Nose normal.     Mouth/Throat:     Mouth: Mucous membranes are moist.  Eyes:     Pupils: Pupils are equal, round, and reactive to light.  Cardiovascular:     Rate and Rhythm: Normal rate and regular rhythm.  Pulmonary:     Effort: Pulmonary effort is normal.     Breath sounds: Normal breath sounds.  Abdominal:     General: Abdomen is flat. Bowel sounds are normal.     Palpations: Abdomen is soft.  Musculoskeletal:        General: Normal range of motion.     Cervical back: Normal range of motion and neck supple.  Skin:    General: Skin is warm and dry.  Neurological:     General: No focal deficit present.     Mental Status: He is alert.  Psychiatric:        Mood and Affect: Mood normal.        Behavior: Behavior normal.     Data Reviewed: Relevant notes from primary care and specialist visits, past discharge summaries as available in EHR, including Care Everywhere. Prior diagnostic  testing as pertinent to current admission diagnoses Updated medications and problem lists for reconciliation ED course, including vitals, labs, imaging, treatment and response to treatment Triage notes, nursing and pharmacy notes and ED provider's notes Notable results as noted in HPI Labs reviewed.  White count 11.2, hemoglobin 13.6, hematocrit 41, RDW 13.5, platelet count 207, sodium 138, potassium 3.8, chloride 106, bicarb 24, glucose 135, BUN 17, creatinine 1.39, calcium 8.8, total protein 7.8, albumin 4.0, AST 23, ALT 19, alk phos 87, total bilirubin 0.5, total cholesterol 178, triglycerides 47, HDL 45,  LDL 125 Twelve-lead EKG reviewed by me shows ST elevations in leads 2, lead 3 and aVF There are no new results to review at this time.  Assessment and Plan: * STEMI involving right coronary artery [facility]) Patient with a known history of coronary artery who presents to the ER for evaluation of chest pain and found to have an inferior wall STEMI involving the right coronary artery. Patient was taken emergently to the Cath Lab and had a left heart cath with stent angioplasty to the RCA. Continue aspirin, Brilinta, metoprolol     Nicotine dependence Smoking cessation was discussed with patient in detail. We will place patient on nicotine transdermal patch 21 mg daily  Hypertension Continue metoprolol and losartan  Coronary artery disease Treatment as outlined in 1  GERD (gastroesophageal reflux disease) Stable Continue Protonix      Advance Care Planning:   Code Status: Full Code   Consults: Cardiology  Family Communication: Greater than 50% of time was spent discussing patient's condition and plan of care with him at the bedside.  All questions and concerns have been addressed.  He verbalizes understanding and agrees to the plan.  Severity of Illness: The appropriate patient status for this patient is INPATIENT. Inpatient status is judged to be reasonable and necessary in  order to provide the required intensity of service to ensure the patient's safety. The patient's presenting symptoms, physical exam findings, and initial radiographic and laboratory data in the context of their chronic comorbidities is felt to place them at high risk for further clinical deterioration. Furthermore, it is not anticipated that the patient will be medically stable for discharge from the hospital within 2 midnights of admission.   * I certify that at the point of admission it is my clinical judgment that the patient will require inpatient hospital care spanning beyond 2 midnights from the point of admission due to high intensity of service, high risk for further deterioration and high frequency of surveillance required.*  Author: Collier Bullock, MD 06/02/2022 9:35 AM  For on call review [email or web address].

## 2022-06-02 NOTE — Assessment & Plan Note (Addendum)
Patient with a known history of coronary artery who presents to the ER for evaluation of chest pain and found to have an inferior wall STEMI involving the right coronary artery. Patient was taken emergently to the Cath Lab and had a left heart cath with stent angioplasty to the RCA. Continue aspirin, Brilinta, metoprolol

## 2022-06-02 NOTE — ED Notes (Signed)
Pt received asa 324 mg per EMS PTA.

## 2022-06-02 NOTE — Progress Notes (Signed)
   06/02/22 0700  Clinical Encounter Type  Visited With Patient  Visit Type Follow-up  Referral From Chaplain  Consult/Referral To Chaplain   Chaplain brought family member Herbert Seta to cath lab waiting area.

## 2022-06-02 NOTE — Consult Note (Signed)
CARDIOLOGY CONSULT NOTE               Patient ID: Alex Brandt MRN: [id number] DOB/AGE: [birth year] 44 y.o.  Admit date: 06/02/2022 Referring Physician Dr. York Cerise emergency room Primary Physician  Primary Cardiologist [facility] Reason for Consultation STEMI inferiorly  HPI: Patient is a 46 year old white male known moderate coronary disease smoking hypertension hyperlipidemia reportedly started having chest pain last night prior to going to bed went to sleep but woke up with excruciating pain finally called rescue at around 530 or 6 AM brought to the emergency room at around 615 EKG consistent with inferior STEMI with ST elevation inferiorly reciprocal changes laterally patient was hemodynamically stable given aspirin and rescue given heparin in the emergency room and then brought to the cardiac Cath Lab for emergent cardiac cath intention to treat with possible PCI and stent.  Chest pain initially was 6 out of 10 when we evaluated him in the emergency room blood pressure forces were normal no nausea vomiting or diarrhea at the time.  Recently had knee surgery with Dr. Crist Infante earlier this year  Review of systems complete and found to be negative unless listed above     Past Medical History:  Diagnosis Date   Angina at rest [facility])    Arthritis    Coronary artery disease 05/05/2021   a.) LHC 05/05/2021: EF 55-65%; normal LV function and LVEDP; 50% mLAD, 50% D1, 50% p-mRCA; med mgmt.   DJD (degenerative joint disease) of knee    Dyspnea    GERD (gastroesophageal reflux disease)    HLD (hyperlipidemia)    Hyperkalemia    Hypertension    Migraines    Valvular insufficiency 12/18/2014   a.) TTE 12/18/2014: EF >55%; mild PR, moderate MR/TR.    Past Surgical History:  Procedure Laterality Date   APPENDECTOMY     CARDIAC CATHETERIZATION     CHOLECYSTECTOMY     COLONOSCOPY     ESOPHAGOGASTRODUODENOSCOPY     KNEE ARTHROSCOPY WITH MEDIAL MENISECTOMY Right 08/30/2021   Procedure:  Right knee arthroscopic partial medial meniscectomy;  Surgeon: Signa Kell, MD;  Location: [facility] ORS;  Service: Orthopedics;  Laterality: Right;   KNEE SURGERY Right    meniscus repair in [place] and again at [facility]   LEFT HEART CATH AND CORONARY ANGIOGRAPHY Left 05/05/2021   Procedure: LEFT HEART CATH AND CORONARY ANGIOGRAPHY;  Surgeon: Alwyn Pea, MD;  Location: [facility] INVASIVE CV LAB;  Service: Cardiovascular;  Laterality: Left;   PARTIAL KNEE ARTHROPLASTY Right 02/15/2022   Procedure: UNICOMPARTMENTAL KNEE;  Surgeon: Christena Flake, MD;  Location: [facility] ORS;  Service: Orthopedics;  Laterality: Right;    Medications Prior to Admission  Medication Sig Dispense Refill Last Dose   apixaban (ELIQUIS) 2.5 MG TABS tablet Take 1 tablet (2.5 mg total) by mouth 2 (two) times daily. 30 tablet 0    diltiazem (CARDIZEM CD) 120 MG 24 hr capsule Take 120 mg by mouth every evening.      nitroGLYCERIN (NITROSTAT) 0.4 MG SL tablet Place 0.4 mg under the tongue every 5 (five) minutes as needed for chest pain.      omeprazole (PRILOSEC) 20 MG capsule Take 20 mg by mouth every morning.      ondansetron (ZOFRAN-ODT) 4 MG disintegrating tablet Take 1 tablet (4 mg total) by mouth every 6 (six) hours as needed for nausea or vomiting. 20 tablet 0    oxyCODONE (ROXICODONE) 5 MG immediate release tablet Take 1-2 tablets (5-10 mg total)  by mouth every 4 (four) hours as needed for moderate pain or severe pain. 40 tablet 0    pravastatin (PRAVACHOL) 40 MG tablet Take 40 mg by mouth every evening.      Social History   Socioeconomic History   Marital status: Married    Spouse name: Not on file   Number of children: Not on file   Years of education: Not on file   Highest education level: Not on file  Occupational History   Not on file  Tobacco Use   Smoking status: Every Day    Packs/day: 0.50    Years: 20.00    Total pack years: 10.00    Types: Cigarettes   Smokeless tobacco: Never  Vaping Use   Vaping  Use: Never used  Substance and Sexual Activity   Alcohol use: Not Currently   Drug use: Never   Sexual activity: Not on file  Other Topics Concern   Not on file  Social History Narrative   Not on file   Social Determinants of Health   Financial Resource Strain: Not on file  Food Insecurity: Not on file  Transportation Needs: Not on file  Physical Activity: Not on file  Stress: Not on file  Social Connections: Not on file  Intimate Partner Violence: Not on file    History reviewed. No pertinent family history.    Review of systems complete and found to be negative unless listed above      PHYSICAL EXAM  General: Well developed, well nourished, in no acute distress HEENT:  Normocephalic and atramatic Neck:  No JVD.  Lungs: Clear bilaterally to auscultation and percussion. Heart: HRRR . Normal S1 and S2 without gallops or murmurs.  Abdomen: Bowel sounds are positive, abdomen soft and non-tender  Msk:  Back normal, normal gait. Normal strength and tone for age. Extremities: No clubbing, cyanosis or edema.   Neuro: Alert and oriented X 3. Psych:  Good affect, responds appropriately  Labs:   Lab Results  Component Value Date   WBC 10.2 02/17/2022   HGB 14.0 02/17/2022   HCT 43.0 02/17/2022   MCV 92.5 02/17/2022   PLT 180 02/17/2022    Recent Labs  Lab 06/02/22 0627  NA 138  K 3.8  CL 106  CO2 24  BUN 17  CREATININE 1.39*  CALCIUM 8.8*  PROT 7.8  BILITOT 0.5  ALKPHOS 87  ALT 19  AST 23  GLUCOSE 135*   Lab Results  Component Value Date   CKTOTAL 118 04/01/2017   TROPONINI <0.03 02/19/2019    Lab Results  Component Value Date   CHOL 178 06/02/2022   Lab Results  Component Value Date   HDL 45 06/02/2022   Lab Results  Component Value Date   LDLCALC 124 (H) 06/02/2022   Lab Results  Component Value Date   TRIG 47 06/02/2022   Lab Results  Component Value Date   CHOLHDL 4.0 06/02/2022   No results found for: "LDLDIRECT"    Radiology:  No results found.  EKG: Normal sinus rhythm diffuse ST elevation inferiorly with reciprocal changes laterally rate of 70 consistent with acute STEMI  ASSESSMENT AND PLAN:  STEMI inferior Coronary artery disease Smoking Hyperlipidemia Hypertension Chronic renal sufficiency stage II  Plan Status post STEMI presentation with cardiac cath PCI and stent to mid RCA with DES stent recommend continue aspirin Brilinta Angiomax for 2 hours  Recommend low-dose beta-blockers ACE or ARB statin aspirin  Advised patient to refrain from tobacco  abuse  Statin therapy Crestor Lipitor  Hypertension management beta-blocker ACE or ARB  Refer the patient to cardiac rehab  Recommend adequate hydration some mild renal insufficiency  Hypotension recommend adequate hydration inferior STEMI  Anticipate discharge within 48 to 72 hours  Signed: Alwyn Pea MD, 06/02/2022, 7:45 AM

## 2022-06-02 NOTE — Progress Notes (Signed)
*  PRELIMINARY RESULTS* Echocardiogram 2D Echocardiogram has been performed.  Cristela Blue 06/02/2022, 11:35 AM

## 2022-06-02 NOTE — Progress Notes (Signed)
   06/02/22 0600  Clinical Encounter Type  Visited With Patient  Visit Type Initial  Referral From Nurse  Consult/Referral To Chaplain   Chaplain responded to code stemi. Chaplain provided compassionate presence and reflective listening as patient spoke about health challenges. Chaplain is waiting for family member and will bring her back upon arrival.

## 2022-06-02 NOTE — Assessment & Plan Note (Signed)
Continue metoprolol and losartan 

## 2022-06-02 NOTE — Assessment & Plan Note (Signed)
Stable ?Continue Protonix ?

## 2022-06-02 NOTE — Assessment & Plan Note (Signed)
Treatment as outlined in 1 

## 2022-06-03 DIAGNOSIS — I2111 ST elevation (STEMI) myocardial infarction involving right coronary artery: Secondary | ICD-10-CM | POA: Diagnosis not present

## 2022-06-03 LAB — CBC
HCT: 41.1 % (ref 39.0–52.0)
Hemoglobin: 13.6 g/dL (ref 13.0–17.0)
MCH: 29 pg (ref 26.0–34.0)
MCHC: 33.1 g/dL (ref 30.0–36.0)
MCV: 87.6 fL (ref 80.0–100.0)
Platelets: 188 10*3/uL (ref 150–400)
RBC: 4.69 MIL/uL (ref 4.22–5.81)
RDW: 14.1 % (ref 11.5–15.5)
WBC: 8.7 10*3/uL (ref 4.0–10.5)
nRBC: 0 % (ref 0.0–0.2)

## 2022-06-03 LAB — BASIC METABOLIC PANEL
Anion gap: 3 — ABNORMAL LOW (ref 5–15)
BUN: 12 mg/dL (ref 6–20)
CO2: 28 mmol/L (ref 22–32)
Calcium: 8.7 mg/dL — ABNORMAL LOW (ref 8.9–10.3)
Chloride: 109 mmol/L (ref 98–111)
Creatinine, Ser: 1.36 mg/dL — ABNORMAL HIGH (ref 0.61–1.24)
GFR, Estimated: >60 mL/min (ref >60)
Glucose, Bld: 115 mg/dL — ABNORMAL HIGH (ref 70–99)
Potassium: 4.1 mmol/L (ref 3.5–5.1)
Sodium: 140 mmol/L (ref 135–145)

## 2022-06-03 LAB — CARDIAC CATHETERIZATION: Cath EF Quantitative: 55 %

## 2022-06-03 MED ORDER — NICOTINE 21 MG/24HR TD PT24: 21.0000 mg | MEDICATED_PATCH | Freq: Every day | TRANSDERMAL | 0 refills | Status: DC

## 2022-06-03 MED ORDER — TICAGRELOR 90 MG PO TABS: 90.0000 mg | ORAL_TABLET | Freq: Two times a day (BID) | ORAL | 11 refills | Status: DC

## 2022-06-03 MED ORDER — METOPROLOL SUCCINATE ER 25 MG PO TB24: 12.5000 mg | ORAL_TABLET | Freq: Every day | ORAL | 1 refills | Status: DC

## 2022-06-03 MED ORDER — ROSUVASTATIN CALCIUM 40 MG PO TABS: 40.0000 mg | ORAL_TABLET | Freq: Every evening | ORAL | 1 refills | Status: DC

## 2022-06-03 NOTE — Hospital Course (Addendum)
Taken from H&P.  Alex Brandt is a 46 y.o. male with medical history significant for coronary artery disease, nicotine dependence, hypertension, dyslipidemia who presented to the ER via EMS for evaluation of chest pain. Patient states symptoms started the night prior to his admission and was mostly in his left axilla with radiation down his left arm associated with nausea and diaphoresis.  He rated his pain a 10 x 10 in intensity at its worst.  He took a BC powder with no improvement in his pain and he took 2 sublingual nitroglycerin tablets which did not provide any relief.  He called EMS and was brought into the ER as a code STEMI. He denies having any cough, no fever, no chills, no abdominal pain, no changes in his bowel habits, no headache, no dizziness, no lightheadedness, no blurred vision or focal deficit.  ED course and data reviewed.White count 11.2, hemoglobin 13.6, hematocrit 41, RDW 13.5, platelet count 207, sodium 138, potassium 3.8, chloride 106, bicarb 24, glucose 135, BUN 17, creatinine 1.39, calcium 8.8, total protein 7.8, albumin 4.0, AST 23, ALT 19, alk phos 87, total bilirubin 0.5, total cholesterol 178, triglycerides 47, HDL 45, LDL 125 EKG with ST elevation in inferior leads with reciprocal changes in lateral leads.  Patient was taken emergently to the Cath Lab for concern of inferior STEMI s/p PCI and drug-eluting stent to mid RCA. Patient will need DAPT for 1 year with aspirin and Brilinta.  8/25: Patient remained stable.  Labs with creatinine of 1.36. Postprocedural echocardiogram with EF of 50 to 86%, grade 1 diastolic dysfunction and no regional wall motion abnormalities. Patient was able to walk briskly without any chest pain or shortness of breath.  He was asking for discharge and cardiology agrees with that.  He is being discharged with aspirin and Brilinta for at least 1 year. He is home Cardizem was replaced with metoprolol. Patient will need ACE inhibitor or ARB  but that can be added as an outpatient by cardiology. Home pravastatin was switched with Crestor. Patient will also participate with cardiac rehab and have a close follow-up with his cardiologist for further recommendations.

## 2022-06-03 NOTE — Discharge Summary (Signed)
Physician Discharge Summary   Patient: Alex Brandt MRN: [id number] DOB: [birth year]  Admit date:     06/02/2022  Discharge date: 06/03/22  Discharge Physician: Lorella Nimrod   PCP: [facility]   Recommendations at discharge:  Please obtain CBC and BMP in 1 week Follow-up with cardiology within a week Follow-up with cardiac rehab  Discharge Diagnoses: Principal Problem:   STEMI involving right coronary artery [facility]) Active Problems:   GERD (gastroesophageal reflux disease)   Coronary artery disease   Hypertension   Nicotine dependence   Hospital Course: Taken from H&P.  Alex Brandt is a 46 y.o. male with medical history significant for coronary artery disease, nicotine dependence, hypertension, dyslipidemia who presented to the ER via EMS for evaluation of chest pain. Patient states symptoms started the night prior to his admission and was mostly in his left axilla with radiation down his left arm associated with nausea and diaphoresis.  He rated his pain a 10 x 10 in intensity at its worst.  He took a BC powder with no improvement in his pain and he took 2 sublingual nitroglycerin tablets which did not provide any relief.  He called EMS and was brought into the ER as a code STEMI. He denies having any cough, no fever, no chills, no abdominal pain, no changes in his bowel habits, no headache, no dizziness, no lightheadedness, no blurred vision or focal deficit.  ED course and data reviewed.White count 11.2, hemoglobin 13.6, hematocrit 41, RDW 13.5, platelet count 207, sodium 138, potassium 3.8, chloride 106, bicarb 24, glucose 135, BUN 17, creatinine 1.39, calcium 8.8, total protein 7.8, albumin 4.0, AST 23, ALT 19, alk phos 87, total bilirubin 0.5, total cholesterol 178, triglycerides 47, HDL 45, LDL 125 EKG with ST elevation in inferior leads with reciprocal changes in lateral leads.  Patient was taken emergently to the Cath Lab for concern of  inferior STEMI s/p PCI and drug-eluting stent to mid RCA. Patient will need DAPT for 1 year with aspirin and Brilinta.  8/25: Patient remained stable.  Labs with creatinine of 1.36. Postprocedural echocardiogram with EF of 50 to 00%, grade 1 diastolic dysfunction and no regional wall motion abnormalities. Patient was able to walk briskly without any chest pain or shortness of breath.  He was asking for discharge and cardiology agrees with that.  He is being discharged with aspirin and Brilinta for at least 1 year. He is home Cardizem was replaced with metoprolol. Patient will need ACE inhibitor or ARB but that can be added as an outpatient by cardiology. Home pravastatin was switched with Crestor. Patient will also participate with cardiac rehab and have a close follow-up with his cardiologist for further recommendations.   Assessment and Plan: * STEMI involving right coronary artery [facility]) Patient with a known history of coronary artery who presents to the ER for evaluation of chest pain and found to have an inferior wall STEMI involving the right coronary artery. Patient was taken emergently to the Cath Lab and had a left heart cath with stent angioplasty to the RCA. Continue aspirin, Brilinta, metoprolol     Nicotine dependence Smoking cessation was discussed with patient in detail. We will place patient on nicotine transdermal patch 21 mg daily  Hypertension Continue metoprolol and losartan  Coronary artery disease Treatment as outlined in 1  GERD (gastroesophageal reflux disease) Stable Continue Protonix   Consultants: Cardiology Procedures performed: Cardiac catheterization with PCI Disposition: Home Diet recommendation:  Discharge Diet  Orders (From admission, onward)     Start     Ordered   06/03/22 0000  Diet - low sodium heart healthy        06/03/22 1048           Cardiac and Carb modified diet DISCHARGE MEDICATION: Allergies as of 06/03/2022        Reactions   Shellfish Allergy Anaphylaxis   Hydrocodone Nausea And Vomiting   Tramadol Other (See Comments)   Migraines        Medication List     STOP taking these medications    diltiazem 120 MG 24 hr capsule Commonly known as: CARDIZEM CD   ibuprofen 800 MG tablet Commonly known as: ADVIL   oxyCODONE 5 MG immediate release tablet Commonly known as: Roxicodone   pravastatin 40 MG tablet Commonly known as: PRAVACHOL       TAKE these medications    aspirin EC 81 MG tablet Take 81 mg by mouth daily. Swallow whole.   metoprolol succinate 25 MG 24 hr tablet Commonly known as: TOPROL-XL Take 0.5 tablets (12.5 mg total) by mouth daily. Start taking on: June 04, 2022   nicotine 21 mg/24hr patch Commonly known as: NICODERM CQ - dosed in mg/24 hours Place 1 patch (21 mg total) onto the skin daily. Start taking on: June 04, 2022   nitroGLYCERIN 0.4 MG SL tablet Commonly known as: NITROSTAT Place 0.4 mg under the tongue every 5 (five) minutes as needed for chest pain.   omeprazole 20 MG capsule Commonly known as: PRILOSEC Take 20 mg by mouth every morning.   ondansetron 4 MG disintegrating tablet Commonly known as: ZOFRAN-ODT Take 1 tablet (4 mg total) by mouth every 6 (six) hours as needed for nausea or vomiting.   rosuvastatin 40 MG tablet Commonly known as: CRESTOR Take 1 tablet (40 mg total) by mouth every evening.   ticagrelor 90 MG Tabs tablet Commonly known as: BRILINTA Take 1 tablet (90 mg total) by mouth 2 (two) times daily.        Follow-up Information     Yolonda Kida, MD. Go in 1 week(s).   Specialties: Cardiology, Internal Medicine Contact information: [address] [place] [address] [phone]                Discharge Exam: Danley Danker Weights   06/02/22 4742 06/02/22 0820  Weight: 97.8 kg 97 kg   General.     In no acute distress. Pulmonary.  Lungs clear bilaterally, normal respiratory effort. CV.   Regular rate and rhythm, no JVD, rub or murmur. Abdomen.  Soft, nontender, nondistended, BS positive. CNS.  Alert and oriented .  No focal neurologic deficit. Extremities.  No edema, no cyanosis, pulses intact and symmetrical. Psychiatry.  Judgment and insight appears normal.   Condition at discharge: stable  The results of significant diagnostics from this hospitalization (including imaging, microbiology, ancillary and laboratory) are listed below for reference.   Imaging Studies: ECHOCARDIOGRAM COMPLETE  Result Date: 06/02/2022    ECHOCARDIOGRAM REPORT   Patient Name:   AXTEN PASCUCCI Date of Exam: 06/02/2022 Medical Rec #:  [id number]          Height:       76.0 in Accession #:    [id number]         Weight:       213.8 lb Date of Birth:  1976-03-16          BSA:  2.279 m Patient Age:    46 years           BP:           104/72 mmHg Patient Gender: M                  HR:           65 bpm. Exam Location:  [facility] Procedure: 2D Echo, Color Doppler and Cardiac Doppler Indications:     Acute myocardial Infarction I21.9  History:         Patient has no prior history of Echocardiogram examinations.                  CAD, Signs/Symptoms:Dyspnea; Risk Factors:Hypertension.  Sonographer:     Sherrie Sport Referring Phys:  [id number] DWAYNE D CALLWOOD Diagnosing Phys: Isaias Cowman MD IMPRESSIONS  1. Left ventricular ejection fraction, by estimation, is 50 to 55%. The left ventricle has low normal function. The left ventricle has no regional wall motion abnormalities. Left ventricular diastolic parameters are consistent with Grade I diastolic dysfunction (impaired relaxation).  2. Right ventricular systolic function is normal. The right ventricular size is normal.  3. The mitral valve is normal in structure. Trivial mitral valve regurgitation. No evidence of mitral stenosis.  4. The aortic valve is normal in structure. Aortic valve regurgitation is not visualized. No aortic stenosis is present.  5. The  inferior vena cava is normal in size with greater than 50% respiratory variability, suggesting right atrial pressure of 3 mmHg. FINDINGS  Left Ventricle: Left ventricular ejection fraction, by estimation, is 50 to 55%. The left ventricle has low normal function. The left ventricle has no regional wall motion abnormalities. The left ventricular internal cavity size was normal in size. There is no left ventricular hypertrophy. Left ventricular diastolic parameters are consistent with Grade I diastolic dysfunction (impaired relaxation). Right Ventricle: The right ventricular size is normal. No increase in right ventricular wall thickness. Right ventricular systolic function is normal. Left Atrium: Left atrial size was normal in size. Right Atrium: Right atrial size was normal in size. Pericardium: There is no evidence of pericardial effusion. Mitral Valve: The mitral valve is normal in structure. Trivial mitral valve regurgitation. No evidence of mitral valve stenosis. Tricuspid Valve: The tricuspid valve is normal in structure. Tricuspid valve regurgitation is trivial. No evidence of tricuspid stenosis. Aortic Valve: The aortic valve is normal in structure. Aortic valve regurgitation is not visualized. No aortic stenosis is present. Aortic valve mean gradient measures 2.0 mmHg. Aortic valve peak gradient measures 4.0 mmHg. Aortic valve area, by VTI measures 2.29 cm. Pulmonic Valve: The pulmonic valve was normal in structure. Pulmonic valve regurgitation is not visualized. No evidence of pulmonic stenosis. Aorta: The aortic root is normal in size and structure. Venous: The inferior vena cava is normal in size with greater than 50% respiratory variability, suggesting right atrial pressure of 3 mmHg. IAS/Shunts: No atrial level shunt detected by color flow Doppler.  LEFT VENTRICLE PLAX 2D LVIDd:         4.40 cm   Diastology LVIDs:         3.20 cm   LV e' medial:    10.70 cm/s LV PW:         1.10 cm   LV E/e' medial:   7.2 LV IVS:        1.40 cm   LV e' lateral:   10.20 cm/s LVOT diam:     2.00 cm  LV E/e' lateral: 7.5 LV SV:         42 LV SV Index:   18 LVOT Area:     3.14 cm  RIGHT VENTRICLE RV Basal diam:  4.20 cm RV S prime:     12.10 cm/s TAPSE (M-mode): 2.5 cm LEFT ATRIUM             Index        RIGHT ATRIUM           Index LA diam:        2.90 cm 1.27 cm/m   RA Area:     15.40 cm LA Vol (A2C):   49.8 ml 21.85 ml/m  RA Volume:   40.60 ml  17.81 ml/m LA Vol (A4C):   35.8 ml 15.71 ml/m LA Biplane Vol: 46.1 ml 20.23 ml/m  AORTIC VALVE AV Area (Vmax):    1.74 cm AV Area (Vmean):   1.73 cm AV Area (VTI):     2.29 cm AV Vmax:           100.00 cm/s AV Vmean:          69.200 cm/s AV VTI:            0.184 m AV Peak Grad:      4.0 mmHg AV Mean Grad:      2.0 mmHg LVOT Vmax:         55.50 cm/s LVOT Vmean:        38.000 cm/s LVOT VTI:          0.134 m LVOT/AV VTI ratio: 0.73  AORTA Ao Root diam: 3.40 cm MITRAL VALVE               TRICUSPID VALVE MV Area (PHT): 2.22 cm    TR Peak grad:   12.4 mmHg MV Decel Time: 342 msec    TR Vmax:        176.00 cm/s MV E velocity: 76.70 cm/s MV A velocity: 96.00 cm/s  SHUNTS MV E/A ratio:  0.80        Systemic VTI:  0.13 m                            Systemic Diam: 2.00 cm Isaias Cowman MD Electronically signed by Isaias Cowman MD Signature Date/Time: 06/02/2022/1:03:07 PM    Final     Microbiology: Results for orders placed or performed during the hospital encounter of 06/02/22  SARS Coronavirus 2 by RT PCR (hospital order, performed in [facility] hospital lab) *cepheid single result test* Anterior Nasal Swab     Status: None   Collection Time: 06/02/22  6:28 AM   Specimen: Anterior Nasal Swab  Result Value Ref Range Status   SARS Coronavirus 2 by RT PCR NEGATIVE NEGATIVE Final    Comment: (NOTE) SARS-CoV-2 target nucleic acids are NOT DETECTED.  The SARS-CoV-2 RNA is generally detectable in upper and lower respiratory specimens during the acute phase of infection.  The lowest concentration of SARS-CoV-2 viral copies this assay can detect is 250 copies / mL. A negative result does not preclude SARS-CoV-2 infection and should not be used as the sole basis for treatment or other patient management decisions.  A negative result may occur with improper specimen collection / handling, submission of specimen other than nasopharyngeal swab, presence of viral mutation(s) within the areas targeted by this assay, and inadequate number of viral copies (<250 copies / mL). A negative result  must be combined with clinical observations, patient history, and epidemiological information.  Fact Sheet for Patients:   https://www.patel.info/  Fact Sheet for Healthcare Providers: https://hall.com/  This test is not yet approved or  cleared by the [place] FDA and has been authorized for detection and/or diagnosis of SARS-CoV-2 by FDA under an Emergency Use Authorization (EUA).  This EUA will remain in effect (meaning this test can be used) for the duration of the COVID-19 declaration under Section 564(b)(1) of the Act, 21 U.S.C. section 360bbb-3(b)(1), unless the authorization is terminated or revoked sooner.  Performed at [facility], [address] [place], [place] [address]   MRSA Next Gen by PCR, Nasal     Status: None   Collection Time: 06/02/22  8:10 AM   Specimen: Nasal Mucosa; Nasal Swab  Result Value Ref Range Status   MRSA by PCR Next Gen NOT DETECTED NOT DETECTED Final    Comment: (NOTE) The GeneXpert MRSA Assay (FDA approved for NASAL specimens only), is one component of a comprehensive MRSA colonization surveillance program. It is not intended to diagnose MRSA infection nor to guide or monitor treatment for MRSA infections. Test performance is not FDA approved in patients less than 78 years old. Performed at [facility], [address] [place], [place] [address]      Labs: CBC: Recent Labs  Lab 06/02/22 0740 06/03/22 0432  WBC 11.2* 8.7  NEUTROABS 9.0*  --   HGB 13.6 13.6  HCT 41.0 41.1  MCV 86.7 87.6  PLT 207 142   Basic Metabolic Panel: Recent Labs  Lab 06/02/22 0627 06/03/22 0432  NA 138 140  K 3.8 4.1  CL 106 109  CO2 24 28  GLUCOSE 135* 115*  BUN 17 12  CREATININE 1.39* 1.36*  CALCIUM 8.8* 8.7*   Liver Function Tests: Recent Labs  Lab 06/02/22 0627  AST 23  ALT 19  ALKPHOS 87  BILITOT 0.5  PROT 7.8  ALBUMIN 4.0   CBG: Recent Labs  Lab 06/02/22 0815  GLUCAP 110*    Discharge time spent: greater than 30 minutes.  This record has been created using Systems analyst. Errors have been sought and corrected,but may not always be located. Such creation errors do not reflect on the standard of care.   Signed: Lorella Nimrod, MD [facility] 06/03/2022

## 2022-06-03 NOTE — Progress Notes (Signed)
[facility] CLINIC CARDIOLOGY CONSULT NOTE       Patient ID: Alex Brandt MRN: [id number] DOB/AGE: Jul 12, 1976 46 y.o.  Admit date: 06/02/2022 Referring Physician Dr. Loleta Rose Primary Physician  Primary Cardiologist Dr. Juliann Pares Reason for Consultation inferior STEMI  HPI: Alex Brandt is a 46 year old male with a past medical history of CAD (50% mid LAD, first diagonal 50%, proximal to mid RCA 50% 05/05/2021), history of right knee arthroplasty, ongoing tobacco use who presented to [facility] ED 06/02/2022 with severe chest pain that woke him out of his sleep.  EKG in the emergency department was consistent with inferior STEMI and he was taken emergently to the Cath Lab by Dr. Juliann Pares.  He is now s/p DES x1 to the mid RCA.  Interval history -No acute events, borderline hypotensive yesterday, beta-blocker and ACE inhibitor held -Echo resulted with preserved LVEF 50-55% with grade 1 diastolic dysfunction, no WMA's. -Feels well today, chest pain-free, has some soreness at his femoral access site. -Walked multiple laps briskly around the unit this morning without chest pain or difficulty, strongly prefers to go home today.   Review of systems complete and found to be negative unless listed above     Past Medical History:  Diagnosis Date   Angina at rest [facility])    Arthritis    Coronary artery disease 05/05/2021   a.) LHC 05/05/2021: EF 55-65%; normal LV function and LVEDP; 50% mLAD, 50% D1, 50% p-mRCA; med mgmt.   DJD (degenerative joint disease) of knee    Dyspnea    GERD (gastroesophageal reflux disease)    HLD (hyperlipidemia)    Hyperkalemia    Hypertension    Migraines    Valvular insufficiency 12/18/2014   a.) TTE 12/18/2014: EF >55%; mild PR, moderate MR/TR.    Past Surgical History:  Procedure Laterality Date   APPENDECTOMY     CARDIAC CATHETERIZATION     CHOLECYSTECTOMY     COLONOSCOPY     ESOPHAGOGASTRODUODENOSCOPY     KNEE ARTHROSCOPY WITH MEDIAL  MENISECTOMY Right 08/30/2021   Procedure: Right knee arthroscopic partial medial meniscectomy;  Surgeon: Signa Kell, MD;  Location: [facility] ORS;  Service: Orthopedics;  Laterality: Right;   KNEE SURGERY Right    meniscus repair in [place] and again at [facility]   LEFT HEART CATH AND CORONARY ANGIOGRAPHY Left 05/05/2021   Procedure: LEFT HEART CATH AND CORONARY ANGIOGRAPHY;  Surgeon: Alwyn Pea, MD;  Location: [facility] INVASIVE CV LAB;  Service: Cardiovascular;  Laterality: Left;   PARTIAL KNEE ARTHROPLASTY Right 02/15/2022   Procedure: UNICOMPARTMENTAL KNEE;  Surgeon: Christena Flake, MD;  Location: [facility] ORS;  Service: Orthopedics;  Laterality: Right;    Medications Prior to Admission  Medication Sig Dispense Refill Last Dose   aspirin EC 81 MG tablet Take 81 mg by mouth daily. Swallow whole.   06/01/2022   diltiazem (CARDIZEM CD) 120 MG 24 hr capsule Take 120 mg by mouth every evening.   06/01/2022   omeprazole (PRILOSEC) 20 MG capsule Take 20 mg by mouth every morning.   06/01/2022   pravastatin (PRAVACHOL) 40 MG tablet Take 40 mg by mouth every evening.   06/01/2022   ibuprofen (ADVIL) 800 MG tablet Take 800 mg by mouth 3 (three) times daily.   prn at prn   nitroGLYCERIN (NITROSTAT) 0.4 MG SL tablet Place 0.4 mg under the tongue every 5 (five) minutes as needed for chest pain.   prn at prn   ondansetron (ZOFRAN-ODT) 4 MG disintegrating tablet Take  1 tablet (4 mg total) by mouth every 6 (six) hours as needed for nausea or vomiting. 20 tablet 0 prn at prn   oxyCODONE (ROXICODONE) 5 MG immediate release tablet Take 1-2 tablets (5-10 mg total) by mouth every 4 (four) hours as needed for moderate pain or severe pain. 40 tablet 0 prn at prn   Social History   Socioeconomic History   Marital status: Married    Spouse name: Not on file   Number of children: Not on file   Years of education: Not on file   Highest education level: Not on file  Occupational History   Not on file  Tobacco Use    Smoking status: Every Day    Packs/day: 0.50    Years: 20.00    Total pack years: 10.00    Types: Cigarettes   Smokeless tobacco: Never  Vaping Use   Vaping Use: Never used  Substance and Sexual Activity   Alcohol use: Not Currently   Drug use: Never   Sexual activity: Not on file  Other Topics Concern   Not on file  Social History Narrative   Not on file   Social Determinants of Health   Financial Resource Strain: Not on file  Food Insecurity: Not on file  Transportation Needs: Not on file  Physical Activity: Not on file  Stress: Not on file  Social Connections: Not on file  Intimate Partner Violence: Not on file    History reviewed. No pertinent family history.    PHYSICAL EXAM General: Pleasant Caucasian male, well nourished, in no acute distress.  Sitting upright in ICU bed, eating breakfast. HEENT:  Normocephalic and atraumatic. Neck:  No JVD.  Lungs: Normal respiratory effort on room air. Clear bilaterally to auscultation. No wheezes, crackles, rhonchi.  Heart: HRRR . Normal S1 and S2 without gallops or murmurs.  Abdomen: Non-distended appearing.  Msk: Normal strength and tone for age. Extremities: Warm and well perfused. No clubbing, cyanosis.  No peripheral edema.  Right groin: Femoral access site with Band-Aid in place, no significant erythema, ecchymosis, or apparent aneurysm. Neuro: Alert and oriented X 3. Psych:  Answers questions appropriately.   Labs: Basic Metabolic Panel: Recent Labs    06/02/22 0627 06/03/22 0432  NA 138 140  K 3.8 4.1  CL 106 109  CO2 24 28  GLUCOSE 135* 115*  BUN 17 12  CREATININE 1.39* 1.36*  CALCIUM 8.8* 8.7*   Liver Function Tests: Recent Labs    06/02/22 0627  AST 23  ALT 19  ALKPHOS 87  BILITOT 0.5  PROT 7.8  ALBUMIN 4.0   No results for input(s): "LIPASE", "AMYLASE" in the last 72 hours. CBC: Recent Labs    06/02/22 0740 06/03/22 0432  WBC 11.2* 8.7  NEUTROABS 9.0*  --   HGB 13.6 13.6  HCT 41.0  41.1  MCV 86.7 87.6  PLT 207 188   Cardiac Enzymes: Recent Labs    06/02/22 0627 06/02/22 0849  TROPONINIHS 25* 11,187*   BNP: Invalid input(s): "POCBNP" D-Dimer: No results for input(s): "DDIMER" in the last 72 hours. Hemoglobin A1C: Recent Labs    06/02/22 0740  HGBA1C 5.3   Fasting Lipid Panel: Recent Labs    06/02/22 0627  CHOL 178  HDL 45  LDLCALC 124*  TRIG 47  CHOLHDL 4.0   Thyroid Function Tests: No results for input(s): "TSH", "T4TOTAL", "T3FREE", "THYROIDAB" in the last 72 hours.  Invalid input(s): "FREET3" Anemia Panel: No results for input(s): "VITAMINB12", "FOLATE", "FERRITIN", "  TIBC", "IRON", "RETICCTPCT" in the last 72 hours.  ECHOCARDIOGRAM COMPLETE  Result Date: 06/02/2022    ECHOCARDIOGRAM REPORT   Patient Name:   Alex Brandt Date of Exam: 06/02/2022 Medical Rec #:  [id number]          Height:       76.0 in Accession #:    [id number]         Weight:       213.8 lb Date of Birth:  [birth year]          BSA:          2.279 m Patient Age:    46 years           BP:           104/72 mmHg Patient Gender: M                  HR:           65 bpm. Exam Location:  [facility] Procedure: 2D Echo, Color Doppler and Cardiac Doppler Indications:     Acute myocardial Infarction I21.9  History:         Patient has no prior history of Echocardiogram examinations.                  CAD, Signs/Symptoms:Dyspnea; Risk Factors:Hypertension.  Sonographer:     Cristela Blue Referring Phys:  [id number] DWAYNE D CALLWOOD Diagnosing Phys: Marcina Millard MD IMPRESSIONS  1. Left ventricular ejection fraction, by estimation, is 50 to 55%. The left ventricle has low normal function. The left ventricle has no regional wall motion abnormalities. Left ventricular diastolic parameters are consistent with Grade I diastolic dysfunction (impaired relaxation).  2. Right ventricular systolic function is normal. The right ventricular size is normal.  3. The mitral valve is normal in structure. Trivial  mitral valve regurgitation. No evidence of mitral stenosis.  4. The aortic valve is normal in structure. Aortic valve regurgitation is not visualized. No aortic stenosis is present.  5. The inferior vena cava is normal in size with greater than 50% respiratory variability, suggesting right atrial pressure of 3 mmHg. FINDINGS  Left Ventricle: Left ventricular ejection fraction, by estimation, is 50 to 55%. The left ventricle has low normal function. The left ventricle has no regional wall motion abnormalities. The left ventricular internal cavity size was normal in size. There is no left ventricular hypertrophy. Left ventricular diastolic parameters are consistent with Grade I diastolic dysfunction (impaired relaxation). Right Ventricle: The right ventricular size is normal. No increase in right ventricular wall thickness. Right ventricular systolic function is normal. Left Atrium: Left atrial size was normal in size. Right Atrium: Right atrial size was normal in size. Pericardium: There is no evidence of pericardial effusion. Mitral Valve: The mitral valve is normal in structure. Trivial mitral valve regurgitation. No evidence of mitral valve stenosis. Tricuspid Valve: The tricuspid valve is normal in structure. Tricuspid valve regurgitation is trivial. No evidence of tricuspid stenosis. Aortic Valve: The aortic valve is normal in structure. Aortic valve regurgitation is not visualized. No aortic stenosis is present. Aortic valve mean gradient measures 2.0 mmHg. Aortic valve peak gradient measures 4.0 mmHg. Aortic valve area, by VTI measures 2.29 cm. Pulmonic Valve: The pulmonic valve was normal in structure. Pulmonic valve regurgitation is not visualized. No evidence of pulmonic stenosis. Aorta: The aortic root is normal in size and structure. Venous: The inferior vena cava is normal in size with greater than 50% respiratory variability, suggesting right atrial  pressure of 3 mmHg. IAS/Shunts: No atrial level shunt  detected by color flow Doppler.  LEFT VENTRICLE PLAX 2D LVIDd:         4.40 cm   Diastology LVIDs:         3.20 cm   LV e' medial:    10.70 cm/s LV PW:         1.10 cm   LV E/e' medial:  7.2 LV IVS:        1.40 cm   LV e' lateral:   10.20 cm/s LVOT diam:     2.00 cm   LV E/e' lateral: 7.5 LV SV:         42 LV SV Index:   18 LVOT Area:     3.14 cm  RIGHT VENTRICLE RV Basal diam:  4.20 cm RV S prime:     12.10 cm/s TAPSE (M-mode): 2.5 cm LEFT ATRIUM             Index        RIGHT ATRIUM           Index LA diam:        2.90 cm 1.27 cm/m   RA Area:     15.40 cm LA Vol (A2C):   49.8 ml 21.85 ml/m  RA Volume:   40.60 ml  17.81 ml/m LA Vol (A4C):   35.8 ml 15.71 ml/m LA Biplane Vol: 46.1 ml 20.23 ml/m  AORTIC VALVE AV Area (Vmax):    1.74 cm AV Area (Vmean):   1.73 cm AV Area (VTI):     2.29 cm AV Vmax:           100.00 cm/s AV Vmean:          69.200 cm/s AV VTI:            0.184 m AV Peak Grad:      4.0 mmHg AV Mean Grad:      2.0 mmHg LVOT Vmax:         55.50 cm/s LVOT Vmean:        38.000 cm/s LVOT VTI:          0.134 m LVOT/AV VTI ratio: 0.73  AORTA Ao Root diam: 3.40 cm MITRAL VALVE               TRICUSPID VALVE MV Area (PHT): 2.22 cm    TR Peak grad:   12.4 mmHg MV Decel Time: 342 msec    TR Vmax:        176.00 cm/s MV E velocity: 76.70 cm/s MV A velocity: 96.00 cm/s  SHUNTS MV E/A ratio:  0.80        Systemic VTI:  0.13 m                            Systemic Diam: 2.00 cm Marcina MillardAlexander Paraschos MD Electronically signed by Marcina MillardAlexander Paraschos MD Signature Date/Time: 06/02/2022/1:03:07 PM    Final      Radiology: ECHOCARDIOGRAM COMPLETE  Result Date: 06/02/2022    ECHOCARDIOGRAM REPORT   Patient Name:   Alex Brandt Date of Exam: 06/02/2022 Medical Rec #:  [id number]          Height:       76.0 in Accession #:    [id number]         Weight:       213.8 lb Date of Birth:  1976-05-12  BSA:          2.279 m Patient Age:    46 years           BP:           104/72 mmHg Patient Gender: M                   HR:           65 bpm. Exam Location:  [facility] Procedure: 2D Echo, Color Doppler and Cardiac Doppler Indications:     Acute myocardial Infarction I21.9  History:         Patient has no prior history of Echocardiogram examinations.                  CAD, Signs/Symptoms:Dyspnea; Risk Factors:Hypertension.  Sonographer:     Cristela Blue Referring Phys:  [id number] DWAYNE D CALLWOOD Diagnosing Phys: Marcina Millard MD IMPRESSIONS  1. Left ventricular ejection fraction, by estimation, is 50 to 55%. The left ventricle has low normal function. The left ventricle has no regional wall motion abnormalities. Left ventricular diastolic parameters are consistent with Grade I diastolic dysfunction (impaired relaxation).  2. Right ventricular systolic function is normal. The right ventricular size is normal.  3. The mitral valve is normal in structure. Trivial mitral valve regurgitation. No evidence of mitral stenosis.  4. The aortic valve is normal in structure. Aortic valve regurgitation is not visualized. No aortic stenosis is present.  5. The inferior vena cava is normal in size with greater than 50% respiratory variability, suggesting right atrial pressure of 3 mmHg. FINDINGS  Left Ventricle: Left ventricular ejection fraction, by estimation, is 50 to 55%. The left ventricle has low normal function. The left ventricle has no regional wall motion abnormalities. The left ventricular internal cavity size was normal in size. There is no left ventricular hypertrophy. Left ventricular diastolic parameters are consistent with Grade I diastolic dysfunction (impaired relaxation). Right Ventricle: The right ventricular size is normal. No increase in right ventricular wall thickness. Right ventricular systolic function is normal. Left Atrium: Left atrial size was normal in size. Right Atrium: Right atrial size was normal in size. Pericardium: There is no evidence of pericardial effusion. Mitral Valve: The mitral valve is normal in structure.  Trivial mitral valve regurgitation. No evidence of mitral valve stenosis. Tricuspid Valve: The tricuspid valve is normal in structure. Tricuspid valve regurgitation is trivial. No evidence of tricuspid stenosis. Aortic Valve: The aortic valve is normal in structure. Aortic valve regurgitation is not visualized. No aortic stenosis is present. Aortic valve mean gradient measures 2.0 mmHg. Aortic valve peak gradient measures 4.0 mmHg. Aortic valve area, by VTI measures 2.29 cm. Pulmonic Valve: The pulmonic valve was normal in structure. Pulmonic valve regurgitation is not visualized. No evidence of pulmonic stenosis. Aorta: The aortic root is normal in size and structure. Venous: The inferior vena cava is normal in size with greater than 50% respiratory variability, suggesting right atrial pressure of 3 mmHg. IAS/Shunts: No atrial level shunt detected by color flow Doppler.  LEFT VENTRICLE PLAX 2D LVIDd:         4.40 cm   Diastology LVIDs:         3.20 cm   LV e' medial:    10.70 cm/s LV PW:         1.10 cm   LV E/e' medial:  7.2 LV IVS:        1.40 cm   LV e' lateral:   10.20  cm/s LVOT diam:     2.00 cm   LV E/e' lateral: 7.5 LV SV:         42 LV SV Index:   18 LVOT Area:     3.14 cm  RIGHT VENTRICLE RV Basal diam:  4.20 cm RV S prime:     12.10 cm/s TAPSE (M-mode): 2.5 cm LEFT ATRIUM             Index        RIGHT ATRIUM           Index LA diam:        2.90 cm 1.27 cm/m   RA Area:     15.40 cm LA Vol (A2C):   49.8 ml 21.85 ml/m  RA Volume:   40.60 ml  17.81 ml/m LA Vol (A4C):   35.8 ml 15.71 ml/m LA Biplane Vol: 46.1 ml 20.23 ml/m  AORTIC VALVE AV Area (Vmax):    1.74 cm AV Area (Vmean):   1.73 cm AV Area (VTI):     2.29 cm AV Vmax:           100.00 cm/s AV Vmean:          69.200 cm/s AV VTI:            0.184 m AV Peak Grad:      4.0 mmHg AV Mean Grad:      2.0 mmHg LVOT Vmax:         55.50 cm/s LVOT Vmean:        38.000 cm/s LVOT VTI:          0.134 m LVOT/AV VTI ratio: 0.73  AORTA Ao Root diam: 3.40 cm  MITRAL VALVE               TRICUSPID VALVE MV Area (PHT): 2.22 cm    TR Peak grad:   12.4 mmHg MV Decel Time: 342 msec    TR Vmax:        176.00 cm/s MV E velocity: 76.70 cm/s MV A velocity: 96.00 cm/s  SHUNTS MV E/A ratio:  0.80        Systemic VTI:  0.13 m                            Systemic Diam: 2.00 cm Marcina Millard MD Electronically signed by Marcina Millard MD Signature Date/Time: 06/02/2022/1:03:07 PM    Final      TELEMETRY reviewed by me (LT) 06/03/2022 : Sinus rhythm 80s, occasional PVCs  EKG reviewed by me: Initially >2 mm STE in leads II, III, aVF with reciprocal depressions in lead I, aVL, V2.  Repeat postprocedure 8/24 at 8:19 AM with resolving ST elevations inferiorly  Data reviewed by me (LT) 06/03/2022: Telemetry, CBC, BMP, EKGs, discussed with hospitalist and nursing staff  ASSESSMENT AND PLAN:  Alex Brandt is a 46 year old male with a past medical history of CAD (50% mid LAD, first diagonal 50%, proximal to mid RCA 50% 05/05/2021), history of right knee arthroplasty, ongoing tobacco use who presented to [facility] ED 06/02/2022 with severe chest pain that woke him out of his sleep.  EKG in the emergency department was consistent with inferior STEMI and he was taken emergently to the Cath Lab by Dr. Juliann Pares.  He is now s/p DES x1 to the mid RCA.  #Inferior STEMI, s/p PCI with DES x1 to the mid RCA 06/02/2022 #History of mild to moderate CAD -S/p Angiomax  infusion 2 hours post LHC (ended 8 AM 8/24) -Continue aspirin 81 mg daily and Brilinta 90 mg twice daily uninterrupted for preferably 1 year -Start low-dose metoprolol XL 12.5 mg once daily (held yesterday due to borderline low blood pressures) -Consider addition of low-dose ACEi/ARB on an outpatient basis -Discontinue diltiazem at discharge in favor of the above medicines -Continue Crestor 40 mg nightly -Cardiac rehab referral at discharge -Continuous monitoring on telemetry -Discussed low-fat, low-cholesterol  diet -He is chest pain-free while walking multiple laps briskly around the ICU.  He is okay for discharge today from a cardiac standpoint, follow-up with Dr. Juliann Pares in 1 week.  #Tobacco abuse Currently smokes three-quarter pack per day, discussed smoking cessation strategies in detail with the patient.  He is motivated to cut back, if not quit entirely.  This patient's plan of care was discussed and created with Dr. Darrold Junker and he is in agreement.  Signed: Rebeca Allegra , PA-C 06/03/2022, 7:56 AM [facility] Card[facility]

## 2022-06-03 NOTE — Progress Notes (Signed)
1210 DISCHARGE TEACHING DONE. QUESTIONS ANSWERED. DISCHARGED HOME WITH WIFE.

## 2022-06-04 LAB — LIPOPROTEIN A (LPA): Lipoprotein (a): 255.3 nmol/L — ABNORMAL HIGH (ref <75.0)

## 2022-06-06 ENCOUNTER — Encounter: Payer: Self-pay | Admitting: Internal Medicine

## 2022-06-20 ENCOUNTER — Encounter: Payer: Medicaid Other | Attending: Internal Medicine

## 2022-06-20 ENCOUNTER — Other Ambulatory Visit: Payer: Self-pay

## 2022-06-20 DIAGNOSIS — Z955 Presence of coronary angioplasty implant and graft: Secondary | ICD-10-CM | POA: Insufficient documentation

## 2022-06-20 DIAGNOSIS — I213 ST elevation (STEMI) myocardial infarction of unspecified site: Secondary | ICD-10-CM | POA: Insufficient documentation

## 2022-06-20 NOTE — Progress Notes (Signed)
Virtual Visit completed. Patient informed on EP and RD appointment and 6 Minute walk test. Patient also informed of patient health questionnaires on My Chart. Patient Verbalizes understanding. Visit diagnosis can be found in [facility] 06/02/2022. 

## 2022-07-06 VITALS — Ht 75.5 in | Wt 211.5 lb

## 2022-07-06 DIAGNOSIS — Z955 Presence of coronary angioplasty implant and graft: Secondary | ICD-10-CM | POA: Diagnosis not present

## 2022-07-06 DIAGNOSIS — I213 ST elevation (STEMI) myocardial infarction of unspecified site: Secondary | ICD-10-CM

## 2022-07-06 NOTE — Patient Instructions (Signed)
Patient Instructions  Patient Details  Name: Alex Brandt MRN: [id number] Date of Birth: Feb 19, 1976 Referring Provider:  [facility]*  Below are your personal goals for exercise, nutrition, and risk factors. Our goal is to help you stay on track towards obtaining and maintaining these goals. We will be discussing your progress on these goals with you throughout the program.  Initial Exercise Prescription:  Initial Exercise Prescription - 07/06/22 1200       Date of Initial Exercise RX and Referring Provider   Date 07/06/22    Referring Provider Lujean Amel MD      Oxygen   Maintain Oxygen Saturation 88% or higher      Treadmill   MPH 2.1    Grade 0.5    Minutes 15    METs 2.75      Recumbant Bike   Level 2    RPM 60    Watts 35    Minutes 15    METs 4      NuStep   Level 3    SPM 80    Minutes 15    METs 4      Prescription Details   Frequency (times per week) 2    Duration Progress to 30 minutes of continuous aerobic without signs/symptoms of physical distress      Intensity   THRR 40-80% of Max Heartrate 113 - 155    Ratings of Perceived Exertion 11-13    Perceived Dyspnea 0-4      Progression   Progression Continue to progress workloads to maintain intensity without signs/symptoms of physical distress.      Resistance Training   Training Prescription Yes    Weight 7 lb             Exercise Goals: Frequency: Be able to perform aerobic exercise two to three times per week in program working toward 2-5 days per week of home exercise.  Intensity: Work with a perceived exertion of 11 (fairly light) - 15 (hard) while following your exercise prescription.  We will make changes to your prescription with you as you progress through the program.   Duration: Be able to do 30 to 45 minutes of continuous aerobic exercise in addition to a 5 minute warm-up and a 5 minute cool-down routine.   Nutrition Goals: Your personal nutrition goals  will be established when you do your nutrition analysis with the dietician.  The following are general nutrition guidelines to follow: Cholesterol < 200mg /day Sodium < 1500mg /day Fiber: Men under 50 yrs - 38 grams per day  Personal Goals:  Personal Goals and Risk Factors at Admission - 07/06/22 1242       Core Components/Risk Factors/Patient Goals on Admission    Weight Management Yes;Weight Loss    Intervention Weight Management: Develop a combined nutrition and exercise program designed to reach desired caloric intake, while maintaining appropriate intake of nutrient and fiber, sodium and fats, and appropriate energy expenditure required for the weight goal.;Weight Management: Provide education and appropriate resources to help participant work on and attain dietary goals.;Weight Management/Obesity: Establish reasonable short term and long term weight goals.    Admit Weight 211 lb (95.7 kg)    Goal Weight: Short Term 206 lb (93.4 kg)    Goal Weight: Long Term 200 lb (90.7 kg)    Expected Outcomes Short Term: Continue to assess and modify interventions until short term weight is achieved;Long Term: Adherence to nutrition and physical activity/exercise program aimed toward attainment  of established weight goal;Weight Loss: Understanding of general recommendations for a balanced deficit meal plan, which promotes 1-2 lb weight loss per week and includes a negative energy balance of [phone] kcal/d;Understanding of distribution of calorie intake throughout the day with the consumption of 4-5 meals/snacks;Understanding recommendations for meals to include 15-35% energy as protein, 25-35% energy from fat, 35-60% energy from carbohydrates, less than 200mg  of dietary cholesterol, 20-35 gm of total fiber daily    Tobacco Cessation Yes    Number of packs per day .5    Intervention Assist the participant in steps to quit. Provide individualized education and counseling about committing to Tobacco  Cessation, relapse prevention, and pharmacological support that can be provided by physician.;Advice worker, assist with locating and accessing local/national Quit Smoking programs, and support quit date choice.    Expected Outcomes Short Term: Will demonstrate readiness to quit, by selecting a quit date.;Short Term: Will quit all tobacco product use, adhering to prevention of relapse plan.;Long Term: Complete abstinence from all tobacco products for at least 12 months from quit date.    Hypertension Yes    Intervention Provide education on lifestyle modifcations including regular physical activity/exercise, weight management, moderate sodium restriction and increased consumption of fresh fruit, vegetables, and low fat dairy, alcohol moderation, and smoking cessation.;Monitor prescription use compliance.    Expected Outcomes Short Term: Continued assessment and intervention until BP is < 140/60mm HG in hypertensive participants. < 130/45mm HG in hypertensive participants with diabetes, heart failure or chronic kidney disease.;Long Term: Maintenance of blood pressure at goal levels.    Lipids Yes    Intervention Provide education and support for participant on nutrition & aerobic/resistive exercise along with prescribed medications to achieve LDL 70mg , HDL >40mg .    Expected Outcomes Short Term: Participant states understanding of desired cholesterol values and is compliant with medications prescribed. Participant is following exercise prescription and nutrition guidelines.;Long Term: Cholesterol controlled with medications as prescribed, with individualized exercise RX and with personalized nutrition plan. Value goals: LDL < 70mg , HDL > 40 mg.             Tobacco Use Initial Evaluation: Social History   Tobacco Use  Smoking Status Every Day   Packs/day: 0.50   Years: 20.00   Total pack years: 10.00   Types: Cigarettes  Smokeless Tobacco Never    Exercise Goals and  Review:  Exercise Goals     Row Name 07/06/22 1241             Exercise Goals   Increase Physical Activity Yes       Intervention Provide advice, education, support and counseling about physical activity/exercise needs.;Develop an individualized exercise prescription for aerobic and resistive training based on initial evaluation findings, risk stratification, comorbidities and participant's personal goals.       Expected Outcomes Short Term: Attend rehab on a regular basis to increase amount of physical activity.;Long Term: Add in home exercise to make exercise part of routine and to increase amount of physical activity.;Long Term: Exercising regularly at least 3-5 days a week.       Increase Strength and Stamina Yes       Intervention Provide advice, education, support and counseling about physical activity/exercise needs.;Develop an individualized exercise prescription for aerobic and resistive training based on initial evaluation findings, risk stratification, comorbidities and participant's personal goals.       Expected Outcomes Short Term: Increase workloads from initial exercise prescription for resistance, speed, and METs.;Short Term: Perform resistance training  exercises routinely during rehab and add in resistance training at home;Long Term: Improve cardiorespiratory fitness, muscular endurance and strength as measured by increased METs and functional capacity (6MWT)       Able to understand and use rate of perceived exertion (RPE) scale Yes       Intervention Provide education and explanation on how to use RPE scale       Expected Outcomes Short Term: Able to use RPE daily in rehab to express subjective intensity level;Long Term:  Able to use RPE to guide intensity level when exercising independently       Able to understand and use Dyspnea scale Yes       Intervention Provide education and explanation on how to use Dyspnea scale       Expected Outcomes Short Term: Able to use Dyspnea  scale daily in rehab to express subjective sense of shortness of breath during exertion;Long Term: Able to use Dyspnea scale to guide intensity level when exercising independently       Knowledge and understanding of Target Heart Rate Range (THRR) Yes       Intervention Provide education and explanation of THRR including how the numbers were predicted and where they are located for reference       Expected Outcomes Short Term: Able to state/look up THRR;Long Term: Able to use THRR to govern intensity when exercising independently;Short Term: Able to use daily as guideline for intensity in rehab       Able to check pulse independently Yes       Intervention Provide education and demonstration on how to check pulse in carotid and radial arteries.;Review the importance of being able to check your own pulse for safety during independent exercise       Expected Outcomes Short Term: Able to explain why pulse checking is important during independent exercise;Long Term: Able to check pulse independently and accurately       Understanding of Exercise Prescription Yes       Intervention Provide education, explanation, and written materials on patient's individual exercise prescription       Expected Outcomes Short Term: Able to explain program exercise prescription;Long Term: Able to explain home exercise prescription to exercise independently                Copy of goals given to participant.

## 2022-07-06 NOTE — Progress Notes (Signed)
Cardiac Individual Treatment Plan  Patient Details  Name: Alex Brandt MRN: [id number] Date of Birth: June 12, 1976 Referring Provider:   Flowsheet Row Cardiac Rehab from 07/06/2022 in [facility] Cardiac and Pulmonary Rehab  Referring Provider Dorothyann Peng MD       Initial Encounter Date:  Flowsheet Row Cardiac Rehab from 07/06/2022 in [facility] Cardiac and Pulmonary Rehab  Date 07/06/22       Visit Diagnosis: ST elevation myocardial infarction (STEMI), unspecified artery (HCC)  Status post coronary artery stent placement  Patient's Home Medications on Admission:  Current Outpatient Medications:    aspirin 81 MG chewable tablet, 1 tablet Orally Once a day, Disp: , Rfl:    aspirin EC 81 MG tablet, Take 81 mg by mouth daily. Swallow whole. (Patient not taking: Reported on 06/20/2022), Disp: , Rfl:    metoprolol succinate (TOPROL-XL) 25 MG 24 hr tablet, Take 0.5 tablets (12.5 mg total) by mouth daily. (Patient not taking: Reported on 06/20/2022), Disp: 30 tablet, Rfl: 1   nicotine (NICODERM CQ - DOSED IN MG/24 HOURS) 21 mg/24hr patch, Place 1 patch (21 mg total) onto the skin daily., Disp: 28 patch, Rfl: 0   nicotine (NICODERM CQ) 21 mg/24hr patch, Place onto the skin. (Patient not taking: Reported on 06/20/2022), Disp: , Rfl:    nitroGLYCERIN (NITROSTAT) 0.4 MG SL tablet, Place 0.4 mg under the tongue every 5 (five) minutes as needed for chest pain., Disp: , Rfl:    omeprazole (PRILOSEC) 20 MG capsule, Take 20 mg by mouth every morning., Disp: , Rfl:    ondansetron (ZOFRAN-ODT) 4 MG disintegrating tablet, Take 1 tablet (4 mg total) by mouth every 6 (six) hours as needed for nausea or vomiting. (Patient not taking: Reported on 06/20/2022), Disp: 20 tablet, Rfl: 0   rosuvastatin (CRESTOR) 40 MG tablet, Take 1 tablet (40 mg total) by mouth every evening., Disp: 90 tablet, Rfl: 1   rosuvastatin (CRESTOR) 40 MG tablet, Take by mouth. (Patient not taking: Reported on 06/20/2022), Disp: , Rfl:     ticagrelor (BRILINTA) 90 MG TABS tablet, Take 1 tablet (90 mg total) by mouth 2 (two) times daily., Disp: 60 tablet, Rfl: 11   ticagrelor (BRILINTA) 90 MG TABS tablet, 1 tablet Orally Twice a day (Patient not taking: Reported on 06/20/2022), Disp: , Rfl:   Past Medical History: Past Medical History:  Diagnosis Date   Angina at rest [facility])    Arthritis    Coronary artery disease 05/05/2021   a.) LHC 05/05/2021: EF 55-65%; normal LV function and LVEDP; 50% mLAD, 50% D1, 50% p-mRCA; med mgmt.   DJD (degenerative joint disease) of knee    Dyspnea    GERD (gastroesophageal reflux disease)    HLD (hyperlipidemia)    Hyperkalemia    Hypertension    Migraines    Valvular insufficiency 12/18/2014   a.) TTE 12/18/2014: EF >55%; mild PR, moderate MR/TR.    Tobacco Use: Social History   Tobacco Use  Smoking Status Every Day   Packs/day: 0.50   Years: 20.00   Total pack years: 10.00   Types: Cigarettes  Smokeless Tobacco Never    Labs: Review Flowsheet       Latest Ref Rng & Units 06/02/2022  Labs for ITP Cardiac and Pulmonary Rehab  Cholestrol 0 - 200 mg/dL 409   LDL (calc) 0 - 99 mg/dL 811   HDL-C >91 mg/dL 45   Trlycerides <478 mg/dL 47   Hemoglobin G9F 4.8 - 5.6 % 5.3  Exercise Target Goals: Exercise Program Goal: Individual exercise prescription set using results from initial 6 min walk test and THRR while considering  patient's activity barriers and safety.   Exercise Prescription Goal: Initial exercise prescription builds to 30-45 minutes a day of aerobic activity, 2-3 days per week.  Home exercise guidelines will be given to patient during program as part of exercise prescription that the participant will acknowledge.   Education: Aerobic Exercise: - Group verbal and visual presentation on the components of exercise prescription. Introduces F.I.T.T principle from ACSM for exercise prescriptions.  Reviews F.I.T.T. principles of aerobic exercise including  progression. Written material given at graduation. Flowsheet Row Cardiac Rehab from 07/06/2022 in [facility] Cardiac and Pulmonary Rehab  Education need identified 07/06/22       Education: Resistance Exercise: - Group verbal and visual presentation on the components of exercise prescription. Introduces F.I.T.T principle from ACSM for exercise prescriptions  Reviews F.I.T.T. principles of resistance exercise including progression. Written material given at graduation. Flowsheet Row Cardiac Rehab from 07/06/2022 in [facility] Cardiac and Pulmonary Rehab  Education need identified 07/06/22        Education: Exercise & Equipment Safety: - Individual verbal instruction and demonstration of equipment use and safety with use of the equipment. Flowsheet Row Cardiac Rehab from 06/20/2022 in [facility] Cardiac and Pulmonary Rehab  Date 06/20/22  Educator [facility]  Instruction Review Code 1- Verbalizes Understanding       Education: Exercise Physiology & General Exercise Guidelines: - Group verbal and written instruction with models to review the exercise physiology of the cardiovascular system and associated critical values. Provides general exercise guidelines with specific guidelines to those with heart or lung disease.    Education: Flexibility, Balance, Mind/Body Relaxation: - Group verbal and visual presentation with interactive activity on the components of exercise prescription. Introduces F.I.T.T principle from ACSM for exercise prescriptions. Reviews F.I.T.T. principles of flexibility and balance exercise training including progression. Also discusses the mind body connection.  Reviews various relaxation techniques to help reduce and manage stress (i.e. Deep breathing, progressive muscle relaxation, and visualization). Balance handout provided to take home. Written material given at graduation.   Activity Barriers & Risk Stratification:  Activity Barriers & Cardiac Risk Stratification - 07/06/22 1231        Activity Barriers & Cardiac Risk Stratification   Activity Barriers Right Knee Replacement;Joint Problems             6 Minute Walk:  6 Minute Walk     Row Name 07/06/22 1231         6 Minute Walk   Phase Initial     Distance 1235 feet     Walk Time 6 minutes     # of Rest Breaks 0     MPH 2.33     METS 4.21     RPE 15     Perceived Dyspnea  0     VO2 Peak 14.73     Symptoms Yes (comment)     Comments Right knee pain 6/10     Resting HR 71 bpm     Resting BP 102/60     Resting Oxygen Saturation  99 %     Exercise Oxygen Saturation  during 6 min walk 99 %     Max Ex. HR 92 bpm     Max Ex. BP 110/60     2 Minute Post BP 98/62              Oxygen Initial Assessment:  Oxygen Re-Evaluation:   Oxygen Discharge (Final Oxygen Re-Evaluation):   Initial Exercise Prescription:  Initial Exercise Prescription - 07/06/22 1200       Date of Initial Exercise RX and Referring Provider   Date 07/06/22    Referring Provider Dorothyann Peng MD      Oxygen   Maintain Oxygen Saturation 88% or higher      Treadmill   MPH 2.1    Grade 0.5    Minutes 15    METs 2.75      Recumbant Bike   Level 2    RPM 60    Watts 35    Minutes 15    METs 4      NuStep   Level 3    SPM 80    Minutes 15    METs 4      Prescription Details   Frequency (times per week) 2    Duration Progress to 30 minutes of continuous aerobic without signs/symptoms of physical distress      Intensity   THRR 40-80% of Max Heartrate 113 - 155    Ratings of Perceived Exertion 11-13    Perceived Dyspnea 0-4      Progression   Progression Continue to progress workloads to maintain intensity without signs/symptoms of physical distress.      Resistance Training   Training Prescription Yes    Weight 7 lb             Perform Capillary Blood Glucose checks as needed.  Exercise Prescription Changes:   Exercise Prescription Changes     Row Name 07/06/22 1200              Response to Exercise   Blood Pressure (Admit) 102/60       Blood Pressure (Exercise) 110/60       Blood Pressure (Exit) 98/62       Heart Rate (Admit) 71 bpm       Heart Rate (Exercise) 92 bpm       Heart Rate (Exit) 73 bpm       Oxygen Saturation (Admit) 99 %       Oxygen Saturation (Exercise) 99 %       Oxygen Saturation (Exit) 99 %       Rating of Perceived Exertion (Exercise) 15       Perceived Dyspnea (Exercise) 0       Symptoms Right knee pain 6/10       Comments walk test results                Exercise Comments:   Exercise Goals and Review:   Exercise Goals     Row Name 07/06/22 1241             Exercise Goals   Increase Physical Activity Yes       Intervention Provide advice, education, support and counseling about physical activity/exercise needs.;Develop an individualized exercise prescription for aerobic and resistive training based on initial evaluation findings, risk stratification, comorbidities and participant's personal goals.       Expected Outcomes Short Term: Attend rehab on a regular basis to increase amount of physical activity.;Long Term: Add in home exercise to make exercise part of routine and to increase amount of physical activity.;Long Term: Exercising regularly at least 3-5 days a week.       Increase Strength and Stamina Yes       Intervention Provide advice, education, support and counseling about physical activity/exercise needs.;Develop an individualized  exercise prescription for aerobic and resistive training based on initial evaluation findings, risk stratification, comorbidities and participant's personal goals.       Expected Outcomes Short Term: Increase workloads from initial exercise prescription for resistance, speed, and METs.;Short Term: Perform resistance training exercises routinely during rehab and add in resistance training at home;Long Term: Improve cardiorespiratory fitness, muscular endurance and strength as measured by  increased METs and functional capacity ( )       Able to understand and use rate of perceived exertion (RPE) scale Yes       Intervention Provide education and explanation on how to use RPE scale       Expected Outcomes Short Term: Able to use RPE daily in rehab to express subjective intensity level;Long Term:  Able to use RPE to guide intensity level when exercising independently       Able to understand and use Dyspnea scale Yes       Intervention Provide education and explanation on how to use Dyspnea scale       Expected Outcomes Short Term: Able to use Dyspnea scale daily in rehab to express subjective sense of shortness of breath during exertion;Long Term: Able to use Dyspnea scale to guide intensity level when exercising independently       Knowledge and understanding of Target Heart Rate Range (THRR) Yes       Intervention Provide education and explanation of THRR including how the numbers were predicted and where they are located for reference       Expected Outcomes Short Term: Able to state/look up THRR;Long Term: Able to use THRR to govern intensity when exercising independently;Short Term: Able to use daily as guideline for intensity in rehab       Able to check pulse independently Yes       Intervention Provide education and demonstration on how to check pulse in carotid and radial arteries.;Review the importance of being able to check your own pulse for safety during independent exercise       Expected Outcomes Short Term: Able to explain why pulse checking is important during independent exercise;Long Term: Able to check pulse independently and accurately       Understanding of Exercise Prescription Yes       Intervention Provide education, explanation, and written materials on patient's individual exercise prescription       Expected Outcomes Short Term: Able to explain program exercise prescription;Long Term: Able to explain home exercise prescription to exercise independently                 Exercise Goals Re-Evaluation :   Discharge Exercise Prescription (Final Exercise Prescription Changes):  Exercise Prescription Changes - 07/06/22 1200       Response to Exercise   Blood Pressure (Admit) 102/60    Blood Pressure (Exercise) 110/60    Blood Pressure (Exit) 98/62    Heart Rate (Admit) 71 bpm    Heart Rate (Exercise) 92 bpm    Heart Rate (Exit) 73 bpm    Oxygen Saturation (Admit) 99 %    Oxygen Saturation (Exercise) 99 %    Oxygen Saturation (Exit) 99 %    Rating of Perceived Exertion (Exercise) 15    Perceived Dyspnea (Exercise) 0    Symptoms Right knee pain 6/10    Comments walk test results             Nutrition:  Target Goals: Understanding of nutrition guidelines, daily intake of sodium 1500mg , cholesterol 200mg , calories 30% from  fat and 7% or less from saturated fats, daily to have 5 or more servings of fruits and vegetables.  Education: All About Nutrition: -Group instruction provided by verbal, written material, interactive activities, discussions, models, and posters to present general guidelines for heart healthy nutrition including fat, fiber, MyPlate, the role of sodium in heart healthy nutrition, utilization of the nutrition label, and utilization of this knowledge for meal planning. Follow up email sent as well. Written material given at graduation. Flowsheet Row Cardiac Rehab from 07/06/2022 in [facility] Cardiac and Pulmonary Rehab  Education need identified 07/06/22       Biometrics:  Pre Biometrics - 07/06/22 1229       Pre Biometrics   Height 6' 3.5" (1.918 m)    Weight 211 lb 8 oz (95.9 kg)    Waist Circumference 40.5 inches    Hip Circumference 42 inches    Waist to Hip Ratio 0.96 %    BMI (Calculated) 26.08    Single Leg Stand 30 seconds              Nutrition Therapy Plan and Nutrition Goals:  Nutrition Therapy & Goals - 07/06/22 1211       Intervention Plan   Intervention Prescribe, educate and counsel  regarding individualized specific dietary modifications aiming towards targeted core components such as weight, hypertension, lipid management, diabetes, heart failure and other comorbidities.    Expected Outcomes Short Term Goal: Understand basic principles of dietary content, such as calories, fat, sodium, cholesterol and nutrients.;Short Term Goal: A plan has been developed with personal nutrition goals set during dietitian appointment.;Long Term Goal: Adherence to prescribed nutrition plan.             Nutrition Assessments:  MEDIFICTS Score Key: ?70 Need to make dietary changes  40-70 Heart Healthy Diet ? 40 Therapeutic Level Cholesterol Diet  Flowsheet Row Cardiac Rehab from 07/06/2022 in [facility] Cardiac and Pulmonary Rehab  Picture Your Plate Total Score on Admission 47      Picture Your Plate Scores: <19 Unhealthy dietary pattern with much room for improvement. 41-50 Dietary pattern unlikely to meet recommendations for good health and room for improvement. 51-60 More healthful dietary pattern, with some room for improvement.  >60 Healthy dietary pattern, although there may be some specific behaviors that could be improved.    Nutrition Goals Re-Evaluation:   Nutrition Goals Discharge (Final Nutrition Goals Re-Evaluation):   Psychosocial: Target Goals: Acknowledge presence or absence of significant depression and/or stress, maximize coping skills, provide positive support system. Participant is able to verbalize types and ability to use techniques and skills needed for reducing stress and depression.   Education: Stress, Anxiety, and Depression - Group verbal and visual presentation to define topics covered.  Reviews how body is impacted by stress, anxiety, and depression.  Also discusses healthy ways to reduce stress and to treat/manage anxiety and depression.  Written material given at graduation. Flowsheet Row Cardiac Rehab from 07/06/2022 in [facility] Cardiac and Pulmonary  Rehab  Education need identified 07/06/22       Education: Sleep Hygiene -Provides group verbal and written instruction about how sleep can affect your health.  Define sleep hygiene, discuss sleep cycles and impact of sleep habits. Review good sleep hygiene tips.    Initial Review & Psychosocial Screening:  Initial Psych Review & Screening - 06/20/22 0955       Initial Review   Current issues with None Identified      Family Dynamics   Good Support  System? Yes    Comments He has a positive outlook on his health wants to quit smoking and his wife is a great supporter.      Barriers   Psychosocial barriers to participate in program The patient should benefit from training in stress management and relaxation.      Screening Interventions   Interventions Encouraged to exercise;To provide support and resources with identified psychosocial needs;Provide feedback about the scores to participant    Expected Outcomes Short Term goal: Utilizing psychosocial counselor, staff and physician to assist with identification of specific Stressors or current issues interfering with healing process. Setting desired goal for each stressor or current issue identified.;Long Term Goal: Stressors or current issues are controlled or eliminated.;Short Term goal: Identification and review with participant of any Quality of Life or Depression concerns found by scoring the questionnaire.;Long Term goal: The participant improves quality of Life and PHQ9 Scores as seen by post scores and/or verbalization of changes             Quality of Life Scores:   Quality of Life - 07/06/22 1213       Quality of Life   Select Quality of Life      Quality of Life Scores   Health/Function Pre 24 %    Socioeconomic Pre 21 %    Psych/Spiritual Pre 27.43 %    Family Pre 27.6 %    GLOBAL Pre 24.51 %            Scores of 19 and below usually indicate a poorer quality of life in these areas.  A difference of  2-3  points is a clinically meaningful difference.  A difference of 2-3 points in the total score of the Quality of Life Index has been associated with significant improvement in overall quality of life, self-image, physical symptoms, and general health in studies assessing change in quality of life.  PHQ-9: Review Flowsheet       07/06/2022  Depression screen PHQ 2/9  Decreased Interest 1  Down, Depressed, Hopeless 0  PHQ - 2 Score 1  Altered sleeping 2  Tired, decreased energy 2  Change in appetite 2  Feeling bad or failure about yourself  0  Trouble concentrating 0  Moving slowly or fidgety/restless 0  Suicidal thoughts 0  PHQ-9 Score 7  Difficult doing work/chores Somewhat difficult   Interpretation of Total Score  Total Score Depression Severity:  1-4 = Minimal depression, 5-9 = Mild depression, 10-14 = Moderate depression, 15-19 = Moderately severe depression, 20-27 = Severe depression   Psychosocial Evaluation and Intervention:   Psychosocial Re-Evaluation:  Psychosocial Re-Evaluation     Row Name 06/20/22 1005             Psychosocial Re-Evaluation   Current issues with Current Stress Concerns;None Identified       Comments He has a positive outlook on his health wants to quit smoking and his wife is a great supporter.       Expected Outcomes Short: Start HeartTrack to help with mood. Long: Maintain a healthy mental state       Interventions Encouraged to attend Cardiac Rehabilitation for the exercise       Continue Psychosocial Services  Follow up required by staff                Psychosocial Discharge (Final Psychosocial Re-Evaluation):  Psychosocial Re-Evaluation - 06/20/22 1005       Psychosocial Re-Evaluation   Current issues with Current Stress  Concerns;None Identified    Comments He has a positive outlook on his health wants to quit smoking and his wife is a great supporter.    Expected Outcomes Short: Start HeartTrack to help with mood. Long:  Maintain a healthy mental state    Interventions Encouraged to attend Cardiac Rehabilitation for the exercise    Continue Psychosocial Services  Follow up required by staff             Vocational Rehabilitation: Provide vocational rehab assistance to qualifying candidates.   Vocational Rehab Evaluation & Intervention:   Education: Education Goals: Education classes will be provided on a variety of topics geared toward better understanding of heart health and risk factor modification. Participant will state understanding/return demonstration of topics presented as noted by education test scores.  Learning Barriers/Preferences:  Learning Barriers/Preferences - 06/20/22 0954       Learning Barriers/Preferences   Learning Barriers None    Learning Preferences None             General Cardiac Education Topics:  AED/CPR: - Group verbal and written instruction with the use of models to demonstrate the basic use of the AED with the basic ABC's of resuscitation.   Anatomy and Cardiac Procedures: - Group verbal and visual presentation and models provide information about basic cardiac anatomy and function. Reviews the testing methods done to diagnose heart disease and the outcomes of the test results. Describes the treatment choices: Medical Management, Angioplasty, or Coronary Bypass Surgery for treating various heart conditions including Myocardial Infarction, Angina, Valve Disease, and Cardiac Arrhythmias.  Written material given at graduation. Flowsheet Row Cardiac Rehab from 07/06/2022 in [facility] Cardiac and Pulmonary Rehab  Education need identified 07/06/22       Medication Safety: - Group verbal and visual instruction to review commonly prescribed medications for heart and lung disease. Reviews the medication, class of the drug, and side effects. Includes the steps to properly store meds and maintain the prescription regimen.  Written material given at  graduation.   Intimacy: - Group verbal instruction through game format to discuss how heart and lung disease can affect sexual intimacy. Written material given at graduation..   Know Your Numbers and Heart Failure: - Group verbal and visual instruction to discuss disease risk factors for cardiac and pulmonary disease and treatment options.  Reviews associated critical values for Overweight/Obesity, Hypertension, Cholesterol, and Diabetes.  Discusses basics of heart failure: signs/symptoms and treatments.  Introduces Heart Failure Zone chart for action plan for heart failure.  Written material given at graduation. Flowsheet Row Cardiac Rehab from 07/06/2022 in [facility] Cardiac and Pulmonary Rehab  Education need identified 07/06/22       Infection Prevention: - Provides verbal and written material to individual with discussion of infection control including proper hand washing and proper equipment cleaning during exercise session. Flowsheet Row Cardiac Rehab from 06/20/2022 in [facility] Cardiac and Pulmonary Rehab  Date 06/20/22  Educator [facility]  Instruction Review Code 1- Verbalizes Understanding       Falls Prevention: - Provides verbal and written material to individual with discussion of falls prevention and safety. Flowsheet Row Cardiac Rehab from 06/20/2022 in [facility] Cardiac and Pulmonary Rehab  Date 06/20/22  Educator [facility]  Instruction Review Code 1- Verbalizes Understanding       Other: -Provides group and verbal instruction on various topics (see comments)   Knowledge Questionnaire Score:  Knowledge Questionnaire Score - 07/06/22 1211       Knowledge Questionnaire Score   Pre  Score 15/26             Core Components/Risk Factors/Patient Goals at Admission:  Personal Goals and Risk Factors at Admission - 07/06/22 1242       Core Components/Risk Factors/Patient Goals on Admission    Weight Management Yes;Weight Loss    Intervention Weight Management: Develop a combined  nutrition and exercise program designed to reach desired caloric intake, while maintaining appropriate intake of nutrient and fiber, sodium and fats, and appropriate energy expenditure required for the weight goal.;Weight Management: Provide education and appropriate resources to help participant work on and attain dietary goals.;Weight Management/Obesity: Establish reasonable short term and long term weight goals.    Admit Weight 211 lb (95.7 kg)    Goal Weight: Short Term 206 lb (93.4 kg)    Goal Weight: Long Term 200 lb (90.7 kg)    Expected Outcomes Short Term: Continue to assess and modify interventions until short term weight is achieved;Long Term: Adherence to nutrition and physical activity/exercise program aimed toward attainment of established weight goal;Weight Loss: Understanding of general recommendations for a balanced deficit meal plan, which promotes 1-2 lb weight loss per week and includes a negative energy balance of [phone] kcal/d;Understanding of distribution of calorie intake throughout the day with the consumption of 4-5 meals/snacks;Understanding recommendations for meals to include 15-35% energy as protein, 25-35% energy from fat, 35-60% energy from carbohydrates, less than 200mg  of dietary cholesterol, 20-35 gm of total fiber daily    Tobacco Cessation Yes    Number of packs per day .5    Intervention Assist the participant in steps to quit. Provide individualized education and counseling about committing to Tobacco Cessation, relapse prevention, and pharmacological support that can be provided by physician.; , assist with locating and accessing local/national Quit Smoking programs, and support quit date choice.    Expected Outcomes Short Term: Will demonstrate readiness to quit, by selecting a quit date.;Short Term: Will quit all tobacco product use, adhering to prevention of relapse plan.;Long Term: Complete abstinence from all tobacco products for at  least 12 months from quit date.    Hypertension Yes    Intervention Provide education on lifestyle modifcations including regular physical activity/exercise, weight management, moderate sodium restriction and increased consumption of fresh fruit, vegetables, and low fat dairy, alcohol moderation, and smoking cessation.;Monitor prescription use compliance.    Expected Outcomes Short Term: Continued assessment and intervention until BP is < 140/36mm HG in hypertensive participants. < 130/26mm HG in hypertensive participants with diabetes, heart failure or chronic kidney disease.;Long Term: Maintenance of blood pressure at goal levels.    Lipids Yes    Intervention Provide education and support for participant on nutrition & aerobic/resistive exercise along with prescribed medications to achieve LDL 70mg , HDL >40mg .    Expected Outcomes Short Term: Participant states understanding of desired cholesterol values and is compliant with medications prescribed. Participant is following exercise prescription and nutrition guidelines.;Long Term: Cholesterol controlled with medications as prescribed, with individualized exercise RX and with personalized nutrition plan. Value goals: LDL < 70mg , HDL > 40 mg.             Education:Diabetes - Individual verbal and written instruction to review signs/symptoms of diabetes, desired ranges of glucose level fasting, after meals and with exercise. Acknowledge that pre and post exercise glucose checks will be done for 3 sessions at entry of program.   Core Components/Risk Factors/Patient Goals Review:    Core Components/Risk Factors/Patient Goals at Discharge (Final Review):  ITP Comments:  ITP Comments     Row Name 06/20/22 0956 07/06/22 1209 07/06/22 1239       ITP Comments Virtual Visit completed. Patient informed on EP and RD appointment and 6 Minute walk test. Patient also informed of patient health questionnaires on My Chart. Patient Verbalizes  understanding. [facility]. Completed 6MWT and gym orientation. Initial ITP created and sent for review to Dr. Bethann PunchesMark Miller, Medical Director. Alex Brandt is a current tobacco user. Intervention for tobacco cessation was provided at the initial medical review. He was asked about readiness to quit and reported that he would like to quit but has no quit date. He did not feel that nicotine patches were helping. He currently smokes about 8 cigarettes/day, versus what used to be 2 packs/day. Patient was advised and educated about tobacco cessation using combination therapy, tobacco cessation classes, quit line, and quit smoking apps. Patient demonstrated understanding of this material. Staff will continue to provide encouragement and follow up with the patient throughout the program.              Comments: Initial ITP

## 2022-07-11 ENCOUNTER — Encounter: Payer: Medicaid Other | Attending: Internal Medicine | Admitting: *Deleted

## 2022-07-11 DIAGNOSIS — I252 Old myocardial infarction: Secondary | ICD-10-CM | POA: Insufficient documentation

## 2022-07-11 DIAGNOSIS — Z5189 Encounter for other specified aftercare: Secondary | ICD-10-CM | POA: Insufficient documentation

## 2022-07-11 DIAGNOSIS — Z955 Presence of coronary angioplasty implant and graft: Secondary | ICD-10-CM | POA: Diagnosis not present

## 2022-07-11 DIAGNOSIS — I213 ST elevation (STEMI) myocardial infarction of unspecified site: Secondary | ICD-10-CM

## 2022-07-11 NOTE — Progress Notes (Signed)
Daily Session Note  Patient Details  Name: Alex Brandt MRN: [id number] Date of Birth: [birth year] Referring Provider:   Flowsheet Row Cardiac Rehab from 07/06/2022 in [facility] Cardiac [facility] Pul[facility] Rehab  Referring Provider Lujean Amel MD       Encounter Date: 07/11/2022  Check In:  Session Check In - 07/11/22 1012       Check-In   Supervising physician immediately available to respond to emergencies See telemetry face sheet for immediately available ER MD    Location [facility]-Cardiac & Pulmonary Rehab    Staff Present Earlean Shawl, BS, ACSM CEP, Exercise Physiologist;Noah Tickle, BS, Exercise Physiologist;Aften Lipsey Tamala Julian, RN, ADN    Virtual Visit No    Medication changes reported     No    Fall or balance concerns reported    No    Tobacco Cessation No Change    Warm-up and Cool-down Performed on first and last piece of equipment    Resistance Training Performed Yes    VAD Patient? No    PAD/SET Patient? No      Pain Assessment   Currently in Pain? No/denies                Social History   Tobacco Use  Smoking Status Every Day   Packs/day: 0.50   Years: 20.00   Total pack years: 10.00   Types: Cigarettes  Smokeless Tobacco Never    Goals Met:  Independence with exercise equipment Exercise tolerated well No report of concerns or symptoms today Strength training completed today  Goals Unmet:  Not Applicable  Comments: First full day of exercise!  Patient was oriented to gym and equipment including functions, settings, policies, and procedures.  Patient's individual exercise prescription and treatment plan were reviewed.  All starting workloads were established based on the results of the 6 minute walk test done at initial orientation visit.  The plan for exercise progression was also introduced and progression will be customized based on patient's performance and goals.    Dr. Emily Filbert is Medical Director for [place].  Dr.  Ottie Glazier is Medical Director for [facility] Pulmonary Rehabilitation.

## 2022-07-13 ENCOUNTER — Encounter: Payer: Self-pay | Admitting: *Deleted

## 2022-07-13 ENCOUNTER — Encounter: Payer: Medicaid Other | Admitting: *Deleted

## 2022-07-13 DIAGNOSIS — I213 ST elevation (STEMI) myocardial infarction of unspecified site: Secondary | ICD-10-CM

## 2022-07-13 DIAGNOSIS — Z5189 Encounter for other specified aftercare: Secondary | ICD-10-CM | POA: Diagnosis not present

## 2022-07-13 NOTE — Progress Notes (Signed)
Daily Session Note  Patient Details  Name: Alex Brandt MRN: [id number] Date of Birth: January 13, 1976 Referring Provider:   Flowsheet Row Cardiac Rehab from 07/06/2022 in [facility] Cardiac and Pul[facility] Rehab  Referring Provider Lujean Amel MD       Encounter Date: 07/13/2022  Check In:  Session Check In - 07/13/22 1010       Check-In   Supervising physician immediately available to respond to emergencies See telemetry face sheet for immediately available ER MD    Location [facility]-Cardiac & Pulmonary Rehab    Staff Present Antionette Fairy, BS, Exercise Physiologist;Joseph Rosebud Poles, RN, [place]    Virtual Visit No    Medication changes reported     No    Fall or balance concerns reported    No    Tobacco Cessation No Change    Warm-up and Cool-down Performed on first and last piece of equipment    Resistance Training Performed Yes    VAD Patient? No    PAD/SET Patient? No      Pain Assessment   Currently in Pain? No/denies                Social History   Tobacco Use  Smoking Status Every Day   Packs/day: 0.50   Years: 20.00   Total pack years: 10.00   Types: Cigarettes  Smokeless Tobacco Never    Goals Met:  Independence with exercise equipment Exercise tolerated well No report of concerns or symptoms today Strength training completed today  Goals Unmet:  Not Applicable  Comments: Pt able to follow exercise prescription today without complaint.  Will continue to monitor for progression.    Dr. Emily Filbert is Medical Director for [place].  Dr. Ottie Glazier is Medical Director for [facility] Pulmonary Rehabilitation.

## 2022-07-13 NOTE — Progress Notes (Signed)
Cardiac Individual Treatment Plan  Patient Details  Name: Alex Brandt MRN: [id number] Date of Birth: 05/11/1976 Referring Provider:   Flowsheet Row Cardiac Rehab from 07/06/2022 in [facility] Cardiac and Pulmonary Rehab  Referring Provider Lujean Amel MD       Initial Encounter Date:  Flowsheet Row Cardiac Rehab from 07/06/2022 in [facility] Cardiac and Pulmonary Rehab  Date 07/06/22       Visit Diagnosis: ST elevation myocardial infarction (STEMI), unspecified artery ([place])  Patient's Home Medications on Admission:  Current Outpatient Medications:    aspirin 81 MG chewable tablet, 1 tablet Orally Once a day, Disp: , Rfl:    aspirin EC 81 MG tablet, Take 81 mg by mouth daily. Swallow whole. (Patient not taking: Reported on 06/20/2022), Disp: , Rfl:    metoprolol succinate (TOPROL-XL) 25 MG 24 hr tablet, Take 0.5 tablets (12.5 mg total) by mouth daily. (Patient not taking: Reported on 06/20/2022), Disp: 30 tablet, Rfl: 1   nicotine (NICODERM CQ - DOSED IN MG/24 HOURS) 21 mg/24hr patch, Place 1 patch (21 mg total) onto the skin daily., Disp: 28 patch, Rfl: 0   nicotine (NICODERM CQ) 21 mg/24hr patch, Place onto the skin. (Patient not taking: Reported on 06/20/2022), Disp: , Rfl:    nitroGLYCERIN (NITROSTAT) 0.4 MG SL tablet, Place 0.4 mg under the tongue every 5 (five) minutes as needed for chest pain., Disp: , Rfl:    omeprazole (PRILOSEC) 20 MG capsule, Take 20 mg by mouth every morning., Disp: , Rfl:    ondansetron (ZOFRAN-ODT) 4 MG disintegrating tablet, Take 1 tablet (4 mg total) by mouth every 6 (six) hours as needed for nausea or vomiting. (Patient not taking: Reported on 06/20/2022), Disp: 20 tablet, Rfl: 0   rosuvastatin (CRESTOR) 40 MG tablet, Take 1 tablet (40 mg total) by mouth every evening., Disp: 90 tablet, Rfl: 1   rosuvastatin (CRESTOR) 40 MG tablet, Take by mouth. (Patient not taking: Reported on 06/20/2022), Disp: , Rfl:    ticagrelor (BRILINTA) 90 MG TABS tablet, Take 1  tablet (90 mg total) by mouth 2 (two) times daily., Disp: 60 tablet, Rfl: 11   ticagrelor (BRILINTA) 90 MG TABS tablet, 1 tablet Orally Twice a day (Patient not taking: Reported on 06/20/2022), Disp: , Rfl:   Past Medical History: Past Medical History:  Diagnosis Date   Angina at rest [facility])    Arthritis    Coronary artery disease 05/05/2021   a.) LHC 05/05/2021: EF 55-65%; normal LV function and LVEDP; 50% mLAD, 50% D1, 50% p-mRCA; med mgmt.   DJD (degenerative joint disease) of knee    Dyspnea    GERD (gastroesophageal reflux disease)    HLD (hyperlipidemia)    Hyperkalemia    Hypertension    Migraines    Valvular insufficiency 12/18/2014   a.) TTE 12/18/2014: EF >55%; mild PR, moderate MR/TR.    Tobacco Use: Social History   Tobacco Use  Smoking Status Every Day   Packs/day: 0.50   Years: 20.00   Total pack years: 10.00   Types: Cigarettes  Smokeless Tobacco Never    Labs: Review Flowsheet       Latest Ref Rng & Units 06/02/2022  Labs for ITP Cardiac and Pulmonary Rehab  Cholestrol 0 - 200 mg/dL 178   LDL (calc) 0 - 99 mg/dL 124   HDL-C >40 mg/dL 45   Trlycerides <150 mg/dL 47   Hemoglobin A1c 4.8 - 5.6 % 5.3      Exercise Target Goals: Exercise Program Goal:  Individual exercise prescription set using results from initial 6 min walk test and THRR while considering  patient's activity barriers and safety.   Exercise Prescription Goal: Initial exercise prescription builds to 30-45 minutes a day of aerobic activity, 2-3 days per week.  Home exercise guidelines will be given to patient during program as part of exercise prescription that the participant will acknowledge.   Education: Aerobic Exercise: - Group verbal and visual presentation on the components of exercise prescription. Introduces F.I.T.T principle from ACSM for exercise prescriptions.  Reviews F.I.T.T. principles of aerobic exercise including progression. Written material given at  graduation. Flowsheet Row Cardiac Rehab from 07/06/2022 in [facility] Cardiac and Pulmonary Rehab  Education need identified 07/06/22       Education: Resistance Exercise: - Group verbal and visual presentation on the components of exercise prescription. Introduces F.I.T.T principle from ACSM for exercise prescriptions  Reviews F.I.T.T. principles of resistance exercise including progression. Written material given at graduation. Flowsheet Row Cardiac Rehab from 07/06/2022 in [facility] Cardiac and Pulmonary Rehab  Education need identified 07/06/22        Education: Exercise & Equipment Safety: - Individual verbal instruction and demonstration of equipment use and safety with use of the equipment. Flowsheet Row Cardiac Rehab from 06/20/2022 in [facility] Cardiac and Pulmonary Rehab  Date 06/20/22  Educator [facility]  Instruction Review Code 1- Verbalizes Understanding       Education: Exercise Physiology & General Exercise Guidelines: - Group verbal and written instruction with models to review the exercise physiology of the cardiovascular system and associated critical values. Provides general exercise guidelines with specific guidelines to those with heart or lung disease.    Education: Flexibility, Balance, Mind/Body Relaxation: - Group verbal and visual presentation with interactive activity on the components of exercise prescription. Introduces F.I.T.T principle from ACSM for exercise prescriptions. Reviews F.I.T.T. principles of flexibility and balance exercise training including progression. Also discusses the mind body connection.  Reviews various relaxation techniques to help reduce and manage stress (i.e. Deep breathing, progressive muscle relaxation, and visualization). Balance handout provided to take home. Written material given at graduation.   Activity Barriers & Risk Stratification:  Activity Barriers & Cardiac Risk Stratification - 07/06/22 1231       Activity Barriers & Cardiac Risk  Stratification   Activity Barriers Right Knee Replacement;Joint Problems             6 Minute Walk:  6 Minute Walk     Row Name 07/06/22 1231         6 Minute Walk   Phase Initial     Distance 1235 feet     Walk Time 6 minutes     # of Rest Breaks 0     MPH 2.33     METS 4.21     RPE 15     Perceived Dyspnea  0     VO2 Peak 14.73     Symptoms Yes (comment)     Comments Right knee pain 6/10     Resting HR 71 bpm     Resting BP 102/60     Resting Oxygen Saturation  99 %     Exercise Oxygen Saturation  during 6 min walk 99 %     Max Ex. HR 92 bpm     Max Ex. BP 110/60     2 Minute Post BP 98/62              Oxygen Initial Assessment:   Oxygen Re-Evaluation:  Oxygen Discharge (Final Oxygen Re-Evaluation):   Initial Exercise Prescription:  Initial Exercise Prescription - 07/06/22 1200       Date of Initial Exercise RX and Referring Provider   Date 07/06/22    Referring Provider Dorothyann Peng MD      Oxygen   Maintain Oxygen Saturation 88% or higher      Treadmill   MPH 2.1    Grade 0.5    Minutes 15    METs 2.75      Recumbant Bike   Level 2    RPM 60    Watts 35    Minutes 15    METs 4      NuStep   Level 3    SPM 80    Minutes 15    METs 4      Prescription Details   Frequency (times per week) 2    Duration Progress to 30 minutes of continuous aerobic without signs/symptoms of physical distress      Intensity   THRR 40-80% of Max Heartrate 113 - 155    Ratings of Perceived Exertion 11-13    Perceived Dyspnea 0-4      Progression   Progression Continue to progress workloads to maintain intensity without signs/symptoms of physical distress.      Resistance Training   Training Prescription Yes    Weight 7 lb             Perform Capillary Blood Glucose checks as needed.  Exercise Prescription Changes:   Exercise Prescription Changes     Row Name 07/06/22 1200             Response to Exercise   Blood  Pressure (Admit) 102/60       Blood Pressure (Exercise) 110/60       Blood Pressure (Exit) 98/62       Heart Rate (Admit) 71 bpm       Heart Rate (Exercise) 92 bpm       Heart Rate (Exit) 73 bpm       Oxygen Saturation (Admit) 99 %       Oxygen Saturation (Exercise) 99 %       Oxygen Saturation (Exit) 99 %       Rating of Perceived Exertion (Exercise) 15       Perceived Dyspnea (Exercise) 0       Symptoms Right knee pain 6/10       Comments walk test results                Exercise Comments:   Exercise Comments     Row Name 07/11/22 1014           Exercise Comments First full day of exercise!  Patient was oriented to gym and equipment including functions, settings, policies, and procedures.  Patient's individual exercise prescription and treatment plan were reviewed.  All starting workloads were established based on the results of the 6 minute walk test done at initial orientation visit.  The plan for exercise progression was also introduced and progression will be customized based on patient's performance and goals.                Exercise Goals and Review:   Exercise Goals     Row Name 07/06/22 1241             Exercise Goals   Increase Physical Activity Yes       Intervention Provide advice, education, support and counseling about  physical activity/exercise needs.;Develop an individualized exercise prescription for aerobic and resistive training based on initial evaluation findings, risk stratification, comorbidities and participant's personal goals.       Expected Outcomes Short Term: Attend rehab on a regular basis to increase amount of physical activity.;Long Term: Add in home exercise to make exercise part of routine and to increase amount of physical activity.;Long Term: Exercising regularly at least 3-5 days a week.       Increase Strength and Stamina Yes       Intervention Provide advice, education, support and counseling about physical activity/exercise  needs.;Develop an individualized exercise prescription for aerobic and resistive training based on initial evaluation findings, risk stratification, comorbidities and participant's personal goals.       Expected Outcomes Short Term: Increase workloads from initial exercise prescription for resistance, speed, and METs.;Short Term: Perform resistance training exercises routinely during rehab and add in resistance training at home;Long Term: Improve cardiorespiratory fitness, muscular endurance and strength as measured by increased METs and functional capacity ( )       Able to understand and use rate of perceived exertion (RPE) scale Yes       Intervention Provide education and explanation on how to use RPE scale       Expected Outcomes Short Term: Able to use RPE daily in rehab to express subjective intensity level;Long Term:  Able to use RPE to guide intensity level when exercising independently       Able to understand and use Dyspnea scale Yes       Intervention Provide education and explanation on how to use Dyspnea scale       Expected Outcomes Short Term: Able to use Dyspnea scale daily in rehab to express subjective sense of shortness of breath during exertion;Long Term: Able to use Dyspnea scale to guide intensity level when exercising independently       Knowledge and understanding of Target Heart Rate Range (THRR) Yes       Intervention Provide education and explanation of THRR including how the numbers were predicted and where they are located for reference       Expected Outcomes Short Term: Able to state/look up THRR;Long Term: Able to use THRR to govern intensity when exercising independently;Short Term: Able to use daily as guideline for intensity in rehab       Able to check pulse independently Yes       Intervention Provide education and demonstration on how to check pulse in carotid and radial arteries.;Review the importance of being able to check your own pulse for safety during  independent exercise       Expected Outcomes Short Term: Able to explain why pulse checking is important during independent exercise;Long Term: Able to check pulse independently and accurately       Understanding of Exercise Prescription Yes       Intervention Provide education, explanation, and written materials on patient's individual exercise prescription       Expected Outcomes Short Term: Able to explain program exercise prescription;Long Term: Able to explain home exercise prescription to exercise independently                Exercise Goals Re-Evaluation :  Exercise Goals Re-Evaluation     Row Name 07/11/22 1014             Exercise Goal Re-Evaluation   Exercise Goals Review Able to understand and use rate of perceived exertion (RPE) scale;Able to understand and use Dyspnea scale;Knowledge and understanding  of Target Heart Rate Range (THRR);Understanding of Exercise Prescription       Comments Reviewed RPE and dyspnea scales, THR and program prescription with pt today.  Pt voiced understanding and was given a copy of goals to take home.       Expected Outcomes Short: Use RPE daily to regulate intensity. Long: Follow program prescription in THR.                Discharge Exercise Prescription (Final Exercise Prescription Changes):  Exercise Prescription Changes - 07/06/22 1200       Response to Exercise   Blood Pressure (Admit) 102/60    Blood Pressure (Exercise) 110/60    Blood Pressure (Exit) 98/62    Heart Rate (Admit) 71 bpm    Heart Rate (Exercise) 92 bpm    Heart Rate (Exit) 73 bpm    Oxygen Saturation (Admit) 99 %    Oxygen Saturation (Exercise) 99 %    Oxygen Saturation (Exit) 99 %    Rating of Perceived Exertion (Exercise) 15    Perceived Dyspnea (Exercise) 0    Symptoms Right knee pain 6/10    Comments walk test results             Nutrition:  Target Goals: Understanding of nutrition guidelines, daily intake of sodium 1500mg , cholesterol  200mg , calories 30% from fat and 7% or less from saturated fats, daily to have 5 or more servings of fruits and vegetables.  Education: All About Nutrition: -Group instruction provided by verbal, written material, interactive activities, discussions, models, and posters to present general guidelines for heart healthy nutrition including fat, fiber, MyPlate, the role of sodium in heart healthy nutrition, utilization of the nutrition label, and utilization of this knowledge for meal planning. Follow up email sent as well. Written material given at graduation. Flowsheet Row Cardiac Rehab from 07/06/2022 in [facility] Cardiac and Pulmonary Rehab  Education need identified 07/06/22       Biometrics:  Pre Biometrics - 07/06/22 1229       Pre Biometrics   Height 6' 3.5" (1.918 m)    Weight 211 lb 8 oz (95.9 kg)    Waist Circumference 40.5 inches    Hip Circumference 42 inches    Waist to Hip Ratio 0.96 %    BMI (Calculated) 26.08    Single Leg Stand 30 seconds              Nutrition Therapy Plan and Nutrition Goals:  Nutrition Therapy & Goals - 07/06/22 1211       Intervention Plan   Intervention Prescribe, educate and counsel regarding individualized specific dietary modifications aiming towards targeted core components such as weight, hypertension, lipid management, diabetes, heart failure and other comorbidities.    Expected Outcomes Short Term Goal: Understand basic principles of dietary content, such as calories, fat, sodium, cholesterol and nutrients.;Short Term Goal: A plan has been developed with personal nutrition goals set during dietitian appointment.;Long Term Goal: Adherence to prescribed nutrition plan.             Nutrition Assessments:  MEDIFICTS Score Key: ?70 Need to make dietary changes  40-70 Heart Healthy Diet ? 40 Therapeutic Level Cholesterol Diet  Flowsheet Row Cardiac Rehab from 07/06/2022 in [facility] Cardiac and Pulmonary Rehab  Picture Your Plate Total  Score on Admission 47      Picture Your Plate Scores: <77 Unhealthy dietary pattern with much room for improvement. 41-50 Dietary pattern unlikely to meet recommendations for good health and room  for improvement. 51-60 More healthful dietary pattern, with some room for improvement.  >60 Healthy dietary pattern, although there may be some specific behaviors that could be improved.    Nutrition Goals Re-Evaluation:   Nutrition Goals Discharge (Final Nutrition Goals Re-Evaluation):   Psychosocial: Target Goals: Acknowledge presence or absence of significant depression and/or stress, maximize coping skills, provide positive support system. Participant is able to verbalize types and ability to use techniques and skills needed for reducing stress and depression.   Education: Stress, Anxiety, and Depression - Group verbal and visual presentation to define topics covered.  Reviews how body is impacted by stress, anxiety, and depression.  Also discusses healthy ways to reduce stress and to treat/manage anxiety and depression.  Written material given at graduation. Flowsheet Row Cardiac Rehab from 07/06/2022 in [facility] Cardiac and Pulmonary Rehab  Education need identified 07/06/22       Education: Sleep Hygiene -Provides group verbal and written instruction about how sleep can affect your health.  Define sleep hygiene, discuss sleep cycles and impact of sleep habits. Review good sleep hygiene tips.    Initial Review & Psychosocial Screening:  Initial Psych Review & Screening - 06/20/22 0955       Initial Review   Current issues with None Identified      Family Dynamics   Good Support System? Yes    Comments He has a positive outlook on his health wants to quit smoking and his wife is a great supporter.      Barriers   Psychosocial barriers to participate in program The patient should benefit from training in stress management and relaxation.      Screening Interventions    Interventions Encouraged to exercise;To provide support and resources with identified psychosocial needs;Provide feedback about the scores to participant    Expected Outcomes Short Term goal: Utilizing psychosocial counselor, staff and physician to assist with identification of specific Stressors or current issues interfering with healing process. Setting desired goal for each stressor or current issue identified.;Long Term Goal: Stressors or current issues are controlled or eliminated.;Short Term goal: Identification and review with participant of any Quality of Life or Depression concerns found by scoring the questionnaire.;Long Term goal: The participant improves quality of Life and PHQ9 Scores as seen by post scores and/or verbalization of changes             Quality of Life Scores:   Quality of Life - 07/06/22 1213       Quality of Life   Select Quality of Life      Quality of Life Scores   Health/Function Pre 24 %    Socioeconomic Pre 21 %    Psych/Spiritual Pre 27.43 %    Family Pre 27.6 %    GLOBAL Pre 24.51 %            Scores of 19 and below usually indicate a poorer quality of life in these areas.  A difference of  2-3 points is a clinically meaningful difference.  A difference of 2-3 points in the total score of the Quality of Life Index has been associated with significant improvement in overall quality of life, self-image, physical symptoms, and general health in studies assessing change in quality of life.  PHQ-9: Review Flowsheet       07/06/2022  Depression screen PHQ 2/9  Decreased Interest 1  Down, Depressed, Hopeless 0  PHQ - 2 Score 1  Altered sleeping 2  Tired, decreased energy 2  Change in  appetite 2  Feeling bad or failure about yourself  0  Trouble concentrating 0  Moving slowly or fidgety/restless 0  Suicidal thoughts 0  PHQ-9 Score 7  Difficult doing work/chores Somewhat difficult   Interpretation of Total Score  Total Score Depression  Severity:  1-4 = Minimal depression, 5-9 = Mild depression, 10-14 = Moderate depression, 15-19 = Moderately severe depression, 20-27 = Severe depression   Psychosocial Evaluation and Intervention:   Psychosocial Re-Evaluation:  Psychosocial Re-Evaluation     [place] Name 06/20/22 1005             Psychosocial Re-Evaluation   Current issues with Current Stress Concerns;None Identified       Comments He has a positive outlook on his health wants to quit smoking and his wife is a great supporter.       Expected Outcomes Short: Start HeartTrack to help with mood. Long: Maintain a healthy mental state       Interventions Encouraged to attend Cardiac Rehabilitation for the exercise       Continue Psychosocial Services  Follow up required by staff                Psychosocial Discharge (Final Psychosocial Re-Evaluation):  Psychosocial Re-Evaluation - 06/20/22 1005       Psychosocial Re-Evaluation   Current issues with Current Stress Concerns;None Identified    Comments He has a positive outlook on his health wants to quit smoking and his wife is a great supporter.    Expected Outcomes Short: Start HeartTrack to help with mood. Long: Maintain a healthy mental state    Interventions Encouraged to attend Cardiac Rehabilitation for the exercise    Continue Psychosocial Services  Follow up required by staff             Vocational Rehabilitation: Provide vocational rehab assistance to qualifying candidates.   Vocational Rehab Evaluation & Intervention:   Education: Education Goals: Education classes will be provided on a variety of topics geared toward better understanding of heart health and risk factor modification. Participant will state understanding/return demonstration of topics presented as noted by education test scores.  Learning Barriers/Preferences:  Learning Barriers/Preferences - 06/20/22 0954       Learning Barriers/Preferences   Learning Barriers None     Learning Preferences None             General Cardiac Education Topics:  AED/CPR: - Group verbal and written instruction with the use of models to demonstrate the basic use of the AED with the basic ABC's of resuscitation.   Anatomy and Cardiac Procedures: - Group verbal and visual presentation and models provide information about basic cardiac anatomy and function. Reviews the testing methods done to diagnose heart disease and the outcomes of the test results. Describes the treatment choices: Medical Management, Angioplasty, or Coronary Bypass Surgery for treating various heart conditions including Myocardial Infarction, Angina, Valve Disease, and Cardiac Arrhythmias.  Written material given at graduation. Flowsheet Row Cardiac Rehab from 07/06/2022 in [facility] Cardiac and Pulmonary Rehab  Education need identified 07/06/22       Medication Safety: - Group verbal and visual instruction to review commonly prescribed medications for heart and lung disease. Reviews the medication, class of the drug, and side effects. Includes the steps to properly store meds and maintain the prescription regimen.  Written material given at graduation.   Intimacy: - Group verbal instruction through game format to discuss how heart and lung disease can affect sexual intimacy. Written material given  at graduation..   Know Your Numbers and Heart Failure: - Group verbal and visual instruction to discuss disease risk factors for cardiac and pulmonary disease and treatment options.  Reviews associated critical values for Overweight/Obesity, Hypertension, Cholesterol, and Diabetes.  Discusses basics of heart failure: signs/symptoms and treatments.  Introduces Heart Failure Zone chart for action plan for heart failure.  Written material given at graduation. Flowsheet Row Card[facility]  Education need identified 07/06/22       Infection Prevention: - Provides  verbal and written material to individual with discussion of infection control including proper hand washing and proper equipment cleaning during exercise session. Flowsheet Row [facility]  Date 06/20/22  [facility]  Instruction Review Code 1- Verbalizes Understanding       Falls Prevention: - Provides verbal and written material to individual with discussion of falls prevention and safety. Flow[facility]  Date 06/20/22  [facility]ator [facility]  Instruction Review Code 1- Verbalizes Understanding       Other: -Provides group and verbal instruction on various topics (see comments)   Knowledge Questionnaire Score:  Knowledge Questionnaire Score - 07/06/22 1211       Knowledge Questionnaire Score   Pre Score 15/26             Core Components/Risk Factors/Patient Goals at Admission:  Personal Goals and Risk Factors at Admission - 07/06/22 1242       Core Components/Risk Factors/Patient Goals on Admission    Weight Management Yes;Weight Loss    Intervention Weight Management: Develop a combined nutrition and exercise program designed to reach desired caloric intake, while maintaining appropriate intake of nutrient and fiber, sodium and fats, and appropriate energy expenditure required for the weight goal.;Weight Management: Provide education and appropriate resources to help participant work on and attain dietary goals.;Weight Management/Obesity: Establish reasonable short term and long term weight goals.    Admit Weight 211 lb (95.7 kg)    Goal Weight: Short Term 206 lb (93.4 kg)    Goal Weight: Long Term 200 lb (90.7 kg)    Expected Outcomes Short Term: Continue to assess and modify interventions until short term weight is achieved;Long Term: Adherence to nutrition and physical activity/exercise program aimed toward attainment of established weight goal;Weight Loss:  Understanding of general recommendations for a balanced deficit meal plan, which promotes 1-2 lb weight loss per week and includes a negative energy balance of [phone] kcal/d;Understanding of distribution of calorie intake throughout the day with the consumption of 4-5 meals/snacks;Understanding recommendations for meals to include 15-35% energy as protein, 25-35% energy from fat, 35-60% energy from carbohydrates, less than 200mg  of dietary cholesterol, 20-35 gm of total fiber daily    Tobacco Cessation Yes    Number of packs per day .5    Intervention Assist the participant in steps to quit. Provide individualized education and counseling about committing to Tobacco Cessation, relapse prevention, and pharmacological support that can be provided by physician.;Education officer, environmentalffer self-teaching materials, assist with locating and accessing local/national Quit Smoking programs, and support quit date choice.    Expected Outcomes Short Term: Will demonstrate readiness to quit, by selecting a quit date.;Short Term: Will quit all tobacco product use, adhering to prevention of relapse plan.;Long Term: Complete abstinence from all tobacco products for at least 12 months from quit date.    Hypertension Yes    Intervention Provide  education on lifestyle modifcations including regular physical activity/exercise, weight management, moderate sodium restriction and increased consumption of fresh fruit, vegetables, and low fat dairy, alcohol moderation, and smoking cessation.;Monitor prescription use compliance.    Expected Outcomes Short Term: Continued assessment and intervention until BP is < 140/53mm HG in hypertensive participants. < 130/24mm HG in hypertensive participants with diabetes, heart failure or chronic kidney disease.;Long Term: Maintenance of blood pressure at goal levels.    Lipids Yes    Intervention Provide education and support for participant on nutrition & aerobic/resistive exercise along with prescribed  medications to achieve LDL 70mg , HDL >40mg .    Expected Outcomes Short Term: Participant states understanding of desired cholesterol values and is compliant with medications prescribed. Participant is following exercise prescription and nutrition guidelines.;Long Term: Cholesterol controlled with medications as prescribed, with individualized exercise RX and with personalized nutrition plan. Value goals: LDL < , HDL > 40 mg.             Education:Diabetes - Individual verbal and written instruction to review signs/symptoms of diabetes, desired ranges of glucose level fasting, after meals and with exercise. Acknowledge that pre and post exercise glucose checks will be done for 3 sessions at entry of program.   Core Components/Risk Factors/Patient Goals Review:    Core Components/Risk Factors/Patient Goals at Discharge (Final Review):    ITP Comments:  ITP Comments     Row Name 06/20/22 0956 07/06/22 1209 07/06/22 1239 07/11/22 1014 07/13/22 0801   ITP Comments Virtual Visit completed. Patient informed on EP and RD appointment and 6 Minute walk test. Patient also informed of patient health questionnaires on My Chart. Patient Verbalizes understanding. Visit diagnosis can be found in [facility]  06/02/2022. Completed and gym orientation. Initial ITP created and sent for review to Dr. Bethann Punches, Medical Director. Dayson is a current tobacco user. Intervention for tobacco cessation was provided at the initial medical review. He was asked about readiness to quit and reported that he would like to quit but has no quit date. He did not feel that nicotine patches were helping. He currently smokes about 8 cigarettes/day, versus what used to be 2 packs/day. Patient was advised and educated about tobacco cessation using combination therapy, tobacco cessation classes, quit line, and quit smoking apps. Patient demonstrated understanding of this material. Staff will continue to provide encouragement and  follow up with the patient throughout the program. First full day of exercise!  Patient was oriented to gym and equipment including functions, settings, policies, and procedures.  Patient's individual exercise prescription and treatment plan were reviewed.  All starting workloads were established based on the results of the 6 minute walk test done at initial orientation visit.  The plan for exercise progression was also introduced and progression will be customized based on patient's performance and goals. 30 Day review completed. Medical Director ITP review done, changes made as directed, and signed approval by Medical Director.    New to program            Comments:

## 2022-07-18 ENCOUNTER — Encounter: Payer: Medicaid Other | Admitting: *Deleted

## 2022-07-19 DIAGNOSIS — I213 ST elevation (STEMI) myocardial infarction of unspecified site: Secondary | ICD-10-CM

## 2022-07-19 NOTE — Progress Notes (Signed)
Completed initial RD consultation ?

## 2022-07-20 ENCOUNTER — Encounter: Payer: Medicaid Other | Admitting: *Deleted

## 2022-07-25 ENCOUNTER — Encounter: Payer: Medicaid Other | Admitting: *Deleted

## 2022-07-27 ENCOUNTER — Telehealth: Payer: Self-pay

## 2022-07-27 DIAGNOSIS — I213 ST elevation (STEMI) myocardial infarction of unspecified site: Secondary | ICD-10-CM

## 2022-07-27 DIAGNOSIS — Z955 Presence of coronary angioplasty implant and graft: Secondary | ICD-10-CM

## 2022-07-27 NOTE — Telephone Encounter (Signed)
Called patient regarding cardiac rehab, has not been here since 10/4. Patient states his knee has been hurting him and its hard to do the exercises. Staff advised patient we can work with him on specific machines that can accommodate him and his knee pain (arm crank, etc). Patient wants to wait it out the weekend to see if his knee is feeling better and will call us next week on what he decides to do.

## 2022-08-01 ENCOUNTER — Encounter: Payer: Medicaid Other | Admitting: *Deleted

## 2022-08-03 ENCOUNTER — Encounter: Payer: Medicaid Other | Admitting: *Deleted

## 2022-08-08 ENCOUNTER — Encounter: Payer: Medicaid Other | Admitting: *Deleted

## 2022-08-08 ENCOUNTER — Other Ambulatory Visit
Admission: RE | Admit: 2022-08-08 | Discharge: 2022-08-08 | Disposition: A | Discharge status: Home / Self Care | Payer: Medicaid Other | Source: Ambulatory Visit | Attending: Student | Admitting: Student

## 2022-08-08 DIAGNOSIS — R635 Abnormal weight gain: Secondary | ICD-10-CM | POA: Diagnosis present

## 2022-08-08 DIAGNOSIS — R0609 Other forms of dyspnea: Secondary | ICD-10-CM | POA: Diagnosis present

## 2022-08-08 LAB — BRAIN NATRIURETIC PEPTIDE: B Natriuretic Peptide: 70 pg/mL (ref 0.0–100.0)

## 2022-08-10 ENCOUNTER — Encounter: Payer: Self-pay | Admitting: *Deleted

## 2022-08-10 DIAGNOSIS — I213 ST elevation (STEMI) myocardial infarction of unspecified site: Secondary | ICD-10-CM

## 2022-08-10 NOTE — Progress Notes (Signed)
Cardiac Individual Treatment Plan  Patient Details  Name: Alex Brandt MRN: [id number] Date of Birth: September 23, 1976 Referring Provider:   Flowsheet Row Cardiac Rehab from 07/06/2022 in [facility] Cardiac and Pulmonary Rehab  Referring Provider Lujean Amel MD       Initial Encounter Date:  Flowsheet Row Cardiac Rehab from 07/06/2022 in [facility] Cardiac and Pulmonary Rehab  Date 07/06/22       Visit Diagnosis: ST elevation myocardial infarction (STEMI), unspecified artery ([place])  Patient's Home Medications on Admission:  Current Outpatient Medications:    aspirin 81 MG chewable tablet, 1 tablet Orally Once a day, Disp: , Rfl:    aspirin EC 81 MG tablet, Take 81 mg by mouth daily. Swallow whole. (Patient not taking: Reported on 06/20/2022), Disp: , Rfl:    metoprolol succinate (TOPROL-XL) 25 MG 24 hr tablet, Take 0.5 tablets (12.5 mg total) by mouth daily. (Patient not taking: Reported on 06/20/2022), Disp: 30 tablet, Rfl: 1   nicotine (NICODERM CQ - DOSED IN MG/24 HOURS) 21 mg/24hr patch, Place 1 patch (21 mg total) onto the skin daily., Disp: 28 patch, Rfl: 0   nicotine (NICODERM CQ) 21 mg/24hr patch, Place onto the skin. (Patient not taking: Reported on 06/20/2022), Disp: , Rfl:    nitroGLYCERIN (NITROSTAT) 0.4 MG SL tablet, Place 0.4 mg under the tongue every 5 (five) minutes as needed for chest pain., Disp: , Rfl:    omeprazole (PRILOSEC) 20 MG capsule, Take 20 mg by mouth every morning., Disp: , Rfl:    ondansetron (ZOFRAN-ODT) 4 MG disintegrating tablet, Take 1 tablet (4 mg total) by mouth every 6 (six) hours as needed for nausea or vomiting. (Patient not taking: Reported on 06/20/2022), Disp: 20 tablet, Rfl: 0   rosuvastatin (CRESTOR) 40 MG tablet, Take 1 tablet (40 mg total) by mouth every evening., Disp: 90 tablet, Rfl: 1   rosuvastatin (CRESTOR) 40 MG tablet, Take by mouth. (Patient not taking: Reported on 06/20/2022), Disp: , Rfl:    ticagrelor (BRILINTA) 90 MG TABS tablet, Take 1  tablet (90 mg total) by mouth 2 (two) times daily., Disp: 60 tablet, Rfl: 11   ticagrelor (BRILINTA) 90 MG TABS tablet, 1 tablet Orally Twice a day (Patient not taking: Reported on 06/20/2022), Disp: , Rfl:   Past Medical History: Past Medical History:  Diagnosis Date   Angina at rest [facility])    Arthritis    Coronary artery disease 05/05/2021   a.) LHC 05/05/2021: EF 55-65%; normal LV function and LVEDP; 50% mLAD, 50% D1, 50% p-mRCA; med mgmt.   DJD (degenerative joint disease) of knee    Dyspnea    GERD (gastroesophageal reflux disease)    HLD (hyperlipidemia)    Hyperkalemia    Hypertension    Migraines    Valvular insufficiency 12/18/2014   a.) TTE 12/18/2014: EF >55%; mild PR, moderate MR/TR.    Tobacco Use: Social History   Tobacco Use  Smoking Status Every Day   Packs/day: 0.50   Years: 20.00   Total pack years: 10.00   Types: Cigarettes  Smokeless Tobacco Never    Labs: Review Flowsheet       Latest Ref Rng & Units 06/02/2022  Labs for ITP Cardiac and Pulmonary Rehab  Cholestrol 0 - 200 mg/dL 178   LDL (calc) 0 - 99 mg/dL 124   HDL-C >40 mg/dL 45   Trlycerides <150 mg/dL 47   Hemoglobin A1c 4.8 - 5.6 % 5.3      Exercise Target Goals: Exercise Program Goal:  Individual exercise prescription set using results from initial 6 min walk test and THRR while considering  patient's activity barriers and safety.   Exercise Prescription Goal: Initial exercise prescription builds to 30-45 minutes a day of aerobic activity, 2-3 days per week.  Home exercise guidelines will be given to patient during program as part of exercise prescription that the participant will acknowledge.   Education: Aerobic Exercise: - Group verbal and visual presentation on the components of exercise prescription. Introduces F.I.T.T principle from ACSM for exercise prescriptions.  Reviews F.I.T.T. principles of aerobic exercise including progression. Written material given at  graduation. Flowsheet Row Cardiac Rehab from 07/13/2022 in [facility] Cardiac and Pulmonary Rehab  Education need identified 07/06/22       Education: Resistance Exercise: - Group verbal and visual presentation on the components of exercise prescription. Introduces F.I.T.T principle from ACSM for exercise prescriptions  Reviews F.I.T.T. principles of resistance exercise including progression. Written material given at graduation. Flowsheet Row Cardiac Rehab from 07/13/2022 in [facility] Cardiac and Pulmonary Rehab  Education need identified 07/06/22        Education: Exercise & Equipment Safety: - Individual verbal instruction and demonstration of equipment use and safety with use of the equipment. Flowsheet Row Cardiac Rehab from 06/20/2022 in [facility] Cardiac and Pulmonary Rehab  Date 06/20/22  Educator [facility]  Instruction Review Code 1- Verbalizes Understanding       Education: Exercise Physiology & General Exercise Guidelines: - Group verbal and written instruction with models to review the exercise physiology of the cardiovascular system and associated critical values. Provides general exercise guidelines with specific guidelines to those with heart or lung disease.    Education: Flexibility, Balance, Mind/Body Relaxation: - Group verbal and visual presentation with interactive activity on the components of exercise prescription. Introduces F.I.T.T principle from ACSM for exercise prescriptions. Reviews F.I.T.T. principles of flexibility and balance exercise training including progression. Also discusses the mind body connection.  Reviews various relaxation techniques to help reduce and manage stress (i.e. Deep breathing, progressive muscle relaxation, and visualization). Balance handout provided to take home. Written material given at graduation.   Activity Barriers & Risk Stratification:  Activity Barriers & Cardiac Risk Stratification - 07/06/22 1231       Activity Barriers & Cardiac Risk  Stratification   Activity Barriers Right Knee Replacement;Joint Problems             6 Minute Walk:  6 Minute Walk     Row Name 07/06/22 1231         6 Minute Walk   Phase Initial     Distance 1235 feet     Walk Time 6 minutes     # of Rest Breaks 0     MPH 2.33     METS 4.21     RPE 15     Perceived Dyspnea  0     VO2 Peak 14.73     Symptoms Yes (comment)     Comments Right knee pain 6/10     Resting HR 71 bpm     Resting BP 102/60     Resting Oxygen Saturation  99 %     Exercise Oxygen Saturation  during 6 min walk 99 %     Max Ex. HR 92 bpm     Max Ex. BP 110/60     2 Minute Post BP 98/62              Oxygen Initial Assessment:   Oxygen Re-Evaluation:  Oxygen Discharge (Final Oxygen Re-Evaluation):   Initial Exercise Prescription:  Initial Exercise Prescription - 07/06/22 1200       Date of Initial Exercise RX and Referring Provider   Date 07/06/22    Referring Provider Lujean Amel MD      Oxygen   Maintain Oxygen Saturation 88% or higher      Treadmill   MPH 2.1    Grade 0.5    Minutes 15    METs 2.75      Recumbant Bike   Level 2    RPM 60    Watts 35    Minutes 15    METs 4      NuStep   Level 3    SPM 80    Minutes 15    METs 4      Prescription Details   Frequency (times per week) 2    Duration Progress to 30 minutes of continuous aerobic without signs/symptoms of physical distress      Intensity   THRR 40-80% of Max Heartrate 113 - 155    Ratings of Perceived Exertion 11-13    Perceived Dyspnea 0-4      Progression   Progression Continue to progress workloads to maintain intensity without signs/symptoms of physical distress.      Resistance Training   Training Prescription Yes    Weight 7 lb             Perform Capillary Blood Glucose checks as needed.  Exercise Prescription Changes:   Exercise Prescription Changes     Row Name 07/06/22 1200 07/19/22 1400           Response to Exercise    Blood Pressure (Admit) 102/60 100/58      Blood Pressure (Exercise) 110/60 142/78      Blood Pressure (Exit) 98/62 108/62      Heart Rate (Admit) 71 bpm 65 bpm      Heart Rate (Exercise) 92 bpm 130 bpm      Heart Rate (Exit) 73 bpm 104 bpm      Oxygen Saturation (Admit) 99 % --      Oxygen Saturation (Exercise) 99 % --      Oxygen Saturation (Exit) 99 % --      Rating of Perceived Exertion (Exercise) 15 12      Perceived Dyspnea (Exercise) 0 0      Symptoms Right knee pain 6/10 none      Comments walk test results --      Duration -- Continue with 30 min of aerobic exercise without signs/symptoms of physical distress.      Intensity -- THRR unchanged        Progression   Progression -- Continue to progress workloads to maintain intensity without signs/symptoms of physical distress.      Average METs -- 3.23        Resistance Training   Training Prescription -- Yes      Weight -- 7 lb      Reps -- 10-15        Interval Training   Interval Training -- No        Treadmill   MPH -- 2.5      Grade -- 1.5      Minutes -- 15      METs -- 3.43        Recumbant Bike   Level -- 4      Watts -- 41  Minutes -- 15      METs -- 3.34        NuStep   Level -- 3      Minutes -- 15      METs -- 3.4        Oxygen   Maintain Oxygen Saturation -- 88% or higher               Exercise Comments:   Exercise Comments     Row Name 07/11/22 1014           Exercise Comments First full day of exercise!  Patient was oriented to gym and equipment including functions, settings, policies, and procedures.  Patient's individual exercise prescription and treatment plan were reviewed.  All starting workloads were established based on the results of the 6 minute walk test done at initial orientation visit.  The plan for exercise progression was also introduced and progression will be customized based on patient's performance and goals.                Exercise Goals and Review:    Exercise Goals     Row Name 07/06/22 1241             Exercise Goals   Increase Physical Activity Yes       Intervention Provide advice, education, support and counseling about physical activity/exercise needs.;Develop an individualized exercise prescription for aerobic and resistive training based on initial evaluation findings, risk stratification, comorbidities and participant's personal goals.       Expected Outcomes Short Term: Attend rehab on a regular basis to increase amount of physical activity.;Long Term: Add in home exercise to make exercise part of routine and to increase amount of physical activity.;Long Term: Exercising regularly at least 3-5 days a week.       Increase Strength and Stamina Yes       Intervention Provide advice, education, support and counseling about physical activity/exercise needs.;Develop an individualized exercise prescription for aerobic and resistive training based on initial evaluation findings, risk stratification, comorbidities and participant's personal goals.       Expected Outcomes Short Term: Increase workloads from initial exercise prescription for resistance, speed, and METs.;Short Term: Perform resistance training exercises routinely during rehab and add in resistance training at home;Long Term: Improve cardiorespiratory fitness, muscular endurance and strength as measured by increased METs and functional capacity (6MWT)       Able to understand and use rate of perceived exertion (RPE) scale Yes       Intervention Provide education and explanation on how to use RPE scale       Expected Outcomes Short Term: Able to use RPE daily in rehab to express subjective intensity level;Long Term:  Able to use RPE to guide intensity level when exercising independently       Able to understand and use Dyspnea scale Yes       Intervention Provide education and explanation on how to use Dyspnea scale       Expected Outcomes Short Term: Able to use Dyspnea scale  daily in rehab to express subjective sense of shortness of breath during exertion;Long Term: Able to use Dyspnea scale to guide intensity level when exercising independently       Knowledge and understanding of Target Heart Rate Range (THRR) Yes       Intervention Provide education and explanation of THRR including how the numbers were predicted and where they are located for reference       Expected  Outcomes Short Term: Able to state/look up THRR;Long Term: Able to use THRR to govern intensity when exercising independently;Short Term: Able to use daily as guideline for intensity in rehab       Able to check pulse independently Yes       Intervention Provide education and demonstration on how to check pulse in carotid and radial arteries.;Review the importance of being able to check your own pulse for safety during independent exercise       Expected Outcomes Short Term: Able to explain why pulse checking is important during independent exercise;Long Term: Able to check pulse independently and accurately       Understanding of Exercise Prescription Yes       Intervention Provide education, explanation, and written materials on patient's individual exercise prescription       Expected Outcomes Short Term: Able to explain program exercise prescription;Long Term: Able to explain home exercise prescription to exercise independently                Exercise Goals Re-Evaluation :  Exercise Goals Re-Evaluation     Row Name 07/11/22 1014 07/19/22 1426 08/02/22 1456         Exercise Goal Re-Evaluation   Exercise Goals Review Able to understand and use rate of perceived exertion (RPE) scale;Able to understand and use Dyspnea scale;Knowledge and understanding of Target Heart Rate Range (THRR);Understanding of Exercise Prescription Increase Physical Activity;Increase Strength and Stamina;Understanding of Exercise Prescription Increase Physical Activity;Increase Strength and Stamina;Understanding of  Exercise Prescription     Comments Reviewed RPE and dyspnea scales, THR and program prescription with pt today.  Pt voiced understanding and was given a copy of goals to take home. Richerd is off to a good start in rehab. He had an overall average MET level of 3.23 METs. He also did well on the treadmill at a speed of 2.5 mph and an incline on 1.5%. He tolerated level 4 on the recumbent bike and level 3 on the T4 as well. We will continue to monitor his progress in the program. Patient has not attended rehab since 10/4 due to having persistent knee pain. When staff spoke with him last, he was wanting to rest it over the weekend and try again the following week, he was supposed to call but have not heard from him since. Staff will continue to try to follow up with patient.     Expected Outcomes Short: Use RPE daily to regulate intensity. Long: Follow program prescription in THR. Short: Continue to follow current exercise prescription and begin to increase workloads. Long: Continue to increase strength and stamina. Short: Return to rehab and get back into routine Long: Continue to build up overall METS              Discharge Exercise Prescription (Final Exercise Prescription Changes):  Exercise Prescription Changes - 07/19/22 1400       Response to Exercise   Blood Pressure (Admit) 100/58    Blood Pressure (Exercise) 142/78    Blood Pressure (Exit) 108/62    Heart Rate (Admit) 65 bpm    Heart Rate (Exercise) 130 bpm    Heart Rate (Exit) 104 bpm    Rating of Perceived Exertion (Exercise) 12    Perceived Dyspnea (Exercise) 0    Symptoms none    Duration Continue with 30 min of aerobic exercise without signs/symptoms of physical distress.    Intensity THRR unchanged      Progression   Progression Continue to progress workloads  to maintain intensity without signs/symptoms of physical distress.    Average METs 3.23      Resistance Training   Training Prescription Yes    Weight 7 lb    Reps  10-15      Interval Training   Interval Training No      Treadmill   MPH 2.5    Grade 1.5    Minutes 15    METs 3.43      Recumbant Bike   Level 4    Watts 41    Minutes 15    METs 3.34      NuStep   Level 3    Minutes 15    METs 3.4      Oxygen   Maintain Oxygen Saturation 88% or higher             Nutrition:  Target Goals: Understanding of nutrition guidelines, daily intake of sodium <1553m, cholesterol <2022m calories 30% from fat and 7% or less from saturated fats, daily to have 5 or more servings of fruits and vegetables.  Education: All About Nutrition: -Group instruction provided by verbal, written material, interactive activities, discussions, models, and posters to present general guidelines for heart healthy nutrition including fat, fiber, MyPlate, the role of sodium in heart healthy nutrition, utilization of the nutrition label, and utilization of this knowledge for meal planning. Follow up email sent as well. Written material given at graduation. [facility]  Education need identified 07/06/22       Biometrics:  Pre Biometrics - 07/06/22 1229       Pre Biometrics   Height 6' 3.5" (1.918 m)    Weight 211 lb 8 oz (95.9 kg)    Waist Circumference 40.5 inches    Hip Circumference 42 inches    Waist to Hip Ratio 0.96 %    BMI (Calculated) 26.08    Single Leg Stand 30 seconds              Nutrition Therapy Plan and Nutrition Goals:  Nutrition Therapy & Goals - 07/19/22 0804       Nutrition Therapy   Diet Heart healthy, low Na    Drug/Food Interactions Statins/Certain Fruits    Protein (specify units) 75-80g    Fiber 33 grams    Whole Grain Foods 3 servings    Saturated Fats 18 max. grams    Fruits and Vegetables 8 servings/day    Sodium 2 grams      Personal Nutrition Goals   Nutrition Goal ST: review paperwork. replace saturated fat with unsaturated fat foods such  as nuts/seeds, avocado, and liquid plant oil. include ground flaxseeds, chia seeds, or walnuts daily. LT: limit saturated fat <18g/day, limit sodium <2g/day, meet fiber recommendations of at least 33g/day.    Comments [age].o. M admitted to cardiac rehab s/p STEMI. PMHx includes CAD, HTN, HLD, current tobacco use. PSHx includes cholecystectomy. Relevant medications includes omeprazole, crestor. Most recent lipid panel 06/02/22: LDL elevated 124, lipoprotein A tested on 06/03/22 was 255.3. PYP Score: 47.  Vegetables & Fruits 7/12. Breads, Grains & Cereals 6/12. Red & Processed Meat 5/12. Poultry 0/2. Fish & Shellfish 0/4. Beans, Nuts & Seeds 2/4. Milk & Dairy Foods 2/6. Toppings, Oils, Seasonings & Salt 9/20. Sweets, Snacks & Restaurant Food 8/14. Beverages 8/10.  B: eggs with 1-2 strips of bacon with oatmeal L: pack of nabs D: mashed potatoes, green beans, and some meat (pork chop,  chicken, venison and other red meat). He reports enjoying greens and salads, bell peppers, onions from their garden. Discussed heart healthy eating and encouraged to keep variety of plant foods, especially high soluble fiber food items to help with higher LDL cholesterol such as oats, apples, citrus fruits, beans/lentils, and barley. Stanely reports that his stomach does not like omega-3 supplements or fish and can cause him to feel ill; discussed how omega-3 EPA/DHA that is correlated with improved heart health is predominately found in fish and supplements. Essential Omega-3 ALA does has a low conversion rate to EPA/DHA, but will still be good to include as it is essential and there can be small amountd of EPA and DHA that he will be able to get from that; some sources are chia seeds, ground flaxseeds, and walnuts which he could add to his daily oatmeal. 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 Aside from pork, Zacherie also enjoys lean red meat like venison; reviewed red meat recommendations for heart healthy and considerations. Reviewed low sodium MNT and general heart healthy eating.      Intervention Plan   Intervention Prescribe, educate and counsel regarding individualized specific dietary modifications aiming towards targeted core components such as weight, hypertension, lipid management, diabetes, heart failure and other comorbidities.    Expected Outcomes Short Term Goal: Understand basic principles of dietary content, such as calories, fat, sodium, cholesterol and nutrients.;Short Term Goal: A plan has been developed with personal nutrition goals set during dietitian appointment.;Long Term Goal: Adherence to prescribed nutrition plan.             Nutrition Assessments:  MEDIFICTS Score Key: ?70 Need to make dietary changes  40-70 Heart Healthy Diet ? 40 Therapeutic Level Cholesterol Diet  Flowsheet Row Cardiac Rehab from 07/06/2022 in [facility] Cardiac and Pulmonary Rehab  Picture Your Plate Total Score on Admission 47      Picture Your Plate Scores: <13 Unhealthy dietary pattern with much room for improvement. 41-50 Dietary pattern unlikely to meet recommendations for good health and room for improvement. 51-60 More healthful dietary pattern, with some room for improvement.  >60 Healthy dietary pattern, although there may be some specific behaviors that could be improved.    Nutrition Goals Re-Evaluation:   Nutrition Goals Discharge (Final Nutrition Goals Re-Evaluation):   Psychosocial: Target Goals: Acknowledge presence or absence of significant depression and/or stress, maximize coping skills, provide positive support system. Participant is able to verbalize types and ability to use techniques and skills needed for reducing stress and depression.   Education: Stress, Anxiety, and Depression - Group verbal  and visual presentation to define topics covered.  Reviews how body is impacted by stress, anxiety, and depression.  Also discusses healthy ways to reduce stress and to treat/manage anxiety and depression.  Written material given at graduation. Flowsheet Row Cardiac Rehab from 07/13/2022 in [facility] Cardiac and Pulmonary Rehab  Education need identified 07/06/22       Education: Sleep Hygiene -Provides group verbal and written instruction about how sleep can affect your health.  Define sleep hygiene, discuss sleep cycles and impact of sleep habits. Review good sleep hygiene tips.    Initial Review & Psychosocial Screening:  Initial Psych Review & Screening - 06/20/22 0955       Initial  Review   Current issues with None Identified      Family Dynamics   Good Support System? Yes    Comments He has a positive outlook on his health wants to quit smoking and his wife is a great supporter.      Barriers   Psychosocial barriers to participate in program The patient should benefit from training in stress management and relaxation.      Screening Interventions   Interventions Encouraged to exercise;To provide support and resources with identified psychosocial needs;Provide feedback about the scores to participant    Expected Outcomes Short Term goal: Utilizing psychosocial counselor, staff and physician to assist with identification of specific Stressors or current issues interfering with healing process. Setting desired goal for each stressor or current issue identified.;Long Term Goal: Stressors or current issues are controlled or eliminated.;Short Term goal: Identification and review with participant of any Quality of Life or Depression concerns found by scoring the questionnaire.;Long Term goal: The participant improves quality of Life and PHQ9 Scores as seen by post scores and/or verbalization of changes             Quality of Life Scores:   Quality of Life - 07/06/22 1213       Quality  of Life   Select Quality of Life      Quality of Life Scores   Health/Function Pre 24 %    Socioeconomic Pre 21 %    Psych/Spiritual Pre 27.43 %    Family Pre 27.6 %    GLOBAL Pre 24.51 %            Scores of 19 and below usually indicate a poorer quality of life in these areas.  A difference of  2-3 points is a clinically meaningful difference.  A difference of 2-3 points in the total score of the Quality of Life Index has been associated with significant improvement in overall quality of life, self-image, physical symptoms, and general health in studies assessing change in quality of life.  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       Expected Outcomes Short: Start HeartTrack to help with mood. Long: Maintain a healthy mental state       Interventions Encouraged to attend Cardiac Rehabilitation for the exercise       Continue Psychosocial Services  Follow up required by staff                Psychosocial Discharge (Final Psychosocial Re-Evaluation):  Psychosocial  Re-Evaluation - 06/20/22 1005       Psychosocial Re-Evaluation   Current issues with Current Stress Concerns;None Identified    Comments He has a positive outlook on his health wants to quit smoking and his wife is a great supporter.    Expected Outcomes Short: Start HeartTrack to help with mood. Long: Maintain a healthy mental state    Interventions Encouraged to attend Cardiac Rehabilitation for the exercise    Continue Psychosocial Services  Follow up required by staff             Vocational Rehabilitation: Provide vocational rehab assistance to qualifying candidates.   Vocational Rehab Evaluation & Intervention:   Education: Education Goals: Education classes will be provided on a variety of topics geared toward better understanding of heart health and risk factor modification. Participant will state understanding/return demonstration of topics presented as noted by education test scores.  Learning Barriers/Preferences:  Learning Barriers/Preferences - 06/20/22 0954       Learning Barriers/Preferences   Learning Barriers None    Learning Preferences None             General Cardiac Education Topics:  AED/CPR: - Group verbal and written instruction with the use of models to demonstrate the basic use of the AED with the basic ABC's of resuscitation.   Anatomy and Cardiac Procedures: - Group verbal and visual presentation and models provide information about basic cardiac anatomy and function. Reviews the testing methods done to diagnose heart disease and the outcomes of the test results. Describes the treatment choices: Medical Management, Angioplasty, or Coronary Bypass Surgery for treating various heart conditions including Myocardial Infarction, Angina, Valve Disease, and Cardiac Arrhythmias.  Written material given at graduation. Flowsheet Row Cardiac Rehab from 07/13/2022 in [facility] Cardiac and Pulmonary Rehab  Education need identified 07/06/22        Medication Safety: - Group verbal and visual instruction to review commonly prescribed medications for heart and lung disease. Reviews the medication, class of the drug, and side effects. Includes the steps to properly store meds and maintain the prescription regimen.  Written material given at graduation. Flowsheet Row Cardiac Rehab from 07/13/2022 in [facility] Cardiac and Pulmonary Rehab  Date 07/13/22  Educator SB  Instruction Review Code 1- Verbalizes Understanding       Intimacy: - Group verbal instruction through game format to discuss how heart and lung disease can affect sexual intimacy. Written material given at graduation..   Know Your Numbers and Heart Failure: - Group verbal and visual instruction to discuss disease risk factors for cardiac and pulmonary disease and treatment options.  Reviews associated critical values for Overweight/Obesity, Hypertension, Cholesterol, and Diabetes.  Discusses basics of heart failure: signs/symptoms and treatments.  Introduces Heart Failure Zone chart for action plan for heart failure.  Written material given at graduation. Flowsheet Row Cardiac Rehab from 07/13/2022 in [facility] Cardiac and Pulmonary Rehab  Education need identified 07/06/22       Infection Prevention: - Provides verbal and written material to individual with discussion of infection control including proper hand washing and proper equipment cleaning during exercise session. Flowsheet Row Cardiac Rehab from 06/20/2022 in [facility] Cardiac and Pulmonary Rehab  Date 06/20/22  Educator [facility]  Instruction Review Code 1- Verbalizes Understanding       Falls Prevention: - Provides verbal and written material to individual with discussion of falls prevention and safety. Flowsheet Row Cardiac Rehab from 06/20/2022 in [facility] Cardiac and Pulmonary Rehab  Date 06/20/22  Educator [facility]  Instruction Review Code 1- Verbalizes Understanding       Other: -Provides group and verbal instruction on  various topics (see comments)   Knowledge Questionnaire Score:  Knowledge Questionnaire Score - 07/06/22 1211       Knowledge Questionnaire Score   Pre Score 15/26             Core Components/Risk Factors/Patient Goals at Admission:  Personal Goals and Risk Factors at Admission - 07/06/22 1242       Core Components/Risk Factors/Patient Goals on Admission    Weight Management Yes;Weight Loss    Intervention Weight Management: Develop a combined nutrition and exercise program designed to reach desired caloric intake, while maintaining appropriate intake of nutrient and fiber, sodium and fats, and appropriate energy expenditure required for the weight goal.;Weight Management: Provide education and appropriate resources to help participant work on and attain dietary goals.;Weight Management/Obesity: Establish reasonable short term and long term weight goals.    Admit Weight 211 lb (95.7 kg)    Goal Weight: Short Term 206 lb (93.4 kg)    Goal Weight: Long Term 200 lb (90.7 kg)    Expected Outcomes Short Term: Continue to assess and modify interventions until short term weight is achieved;Long Term: Adherence to nutrition and physical activity/exercise program aimed toward attainment of established weight goal;Weight Loss: Understanding of general recommendations for a balanced deficit meal plan, which promotes 1-2 lb weight loss per week and includes a negative energy balance of [phone] kcal/d;Understanding of distribution of calorie intake throughout the day with the consumption of 4-5 meals/snacks;Understanding recommendations for meals to include 15-35% energy as protein, 25-35% energy from fat, 35-60% energy from carbohydrates, less than 252m of dietary cholesterol, 20-35 gm of total fiber daily    Tobacco Cessation Yes    Number of packs per day .5    Intervention Assist the participant in steps to quit. Provide individualized education and counseling about committing to Tobacco  Cessation, relapse prevention, and pharmacological support that can be provided by physician.;OAdvice worker assist with locating and accessing local/national Quit Smoking programs, and support quit date choice.    Expected Outcomes Short Term: Will demonstrate readiness to quit, by selecting a quit date.;Short Term: Will quit all tobacco product use, adhering to prevention of relapse plan.;Long Term: Complete abstinence from all tobacco products for at least 12 months from quit date.    Hypertension Yes    Intervention Provide education on lifestyle modifcations including regular physical activity/exercise, weight management, moderate sodium restriction and increased consumption of fresh fruit, vegetables, and low fat dairy, alcohol moderation, and smoking cessation.;Monitor prescription use compliance.    Expected Outcomes Short Term: Continued assessment and intervention until BP is < 140/932mHG in hypertensive participants. < 130/8014mG in hypertensive participants with diabetes, heart failure or chronic kidney disease.;Long Term: Maintenance of blood pressure at goal levels.    Lipids Yes    Intervention Provide education and support for participant on nutrition & aerobic/resistive exercise along with prescribed medications to achieve LDL <82m80mDL >40mg23m Expected Outcomes Short Term: Participant states understanding of desired cholesterol values and is compliant with medications prescribed. Participant is following exercise prescription and nutrition guidelines.;Long Term: Cholesterol controlled with medications as prescribed, with individualized exercise RX and with personalized nutrition plan. Value goals: LDL < 82mg,54m > 40 mg.             Education:Diabetes - Individual verbal and written instruction to review signs/symptoms of diabetes, desired ranges  of glucose level fasting, after meals and with exercise. Acknowledge that pre and post exercise glucose checks will  be done for 3 sessions at entry of program.   Core Components/Risk Factors/Patient Goals Review:    Core Components/Risk Factors/Patient Goals at Discharge (Final Review):    ITP Comments:  ITP Comments     Row Name 06/20/22 0956 07/06/22 1209 07/06/22 1239 07/11/22 1014 07/13/22 0801   ITP Comments Virtual Visit completed. Patient informed on EP and RD appointment and 6 Minute walk test. Patient also informed of patient health questionnaires on My Chart. Patient Verbalizes understanding. Visit diagnosis can be found in [facility]  06/02/2022. Completed 6MWT and gym orientation. Initial ITP created and sent for review to Dr. Emily Filbert, Medical Director. Maribel is a current tobacco user. Intervention for tobacco cessation was provided at the initial medical review. He was asked about readiness to quit and reported that he would like to quit but has no quit date. He did not feel that nicotine patches were helping. He currently smokes about 8 cigarettes/day, versus what used to be 2 packs/day. Patient was advised and educated about tobacco cessation using combination therapy, tobacco cessation classes, quit line, and quit smoking apps. Patient demonstrated understanding of this material. Staff will continue to provide encouragement and follow up with the patient throughout the program. First full day of exercise!  Patient was oriented to gym and equipment including functions, settings, policies, and procedures.  Patient's individual exercise prescription and treatment plan were reviewed.  All starting workloads were established based on the results of the 6 minute walk test done at initial orientation visit.  The plan for exercise progression was also introduced and progression will be customized based on patient's performance and goals. 30 Day review completed. Medical Director ITP review done, changes made as directed, and signed approval by Medical Director.    New to program    Row Name 07/19/22 0847  07/27/22 1141 08/10/22 1033       ITP Comments Completed initial RD consultation Patient has not been here since 10/4. Patient states his knee has been hurting him and its hard to do the exercises. Staff advised patient we can work with him on specific machines that can accommodate him and his knee pain (arm crank, etc). Patient wants to wait it out the weekend to see if his knee is feeling better and will call us next week on what he decides to do. 30 Day review completed. Medical Director ITP review done, changes made as directed, and signed approval by Medical Director.    NO visit since 10/4              Comments:

## 2022-08-16 DIAGNOSIS — I213 ST elevation (STEMI) myocardial infarction of unspecified site: Secondary | ICD-10-CM

## 2022-08-16 NOTE — Progress Notes (Signed)
Discharge Progress Report  Patient Details  Name: NIMAI BURBACH MRN: [id number] Date of Birth: 02-06-1976 Referring Provider:   Flowsheet Row Cardiac Rehab from 07/06/2022 in [facility] Cardiac and Pulmonary Rehab  Referring Provider Lujean Amel MD        Number of Visits: 4  Reason for Discharge:  Early Exit:  Personal Balraj would like to be discharged at this time as his knee will need further testing to see what is wrong.

## 2022-08-16 NOTE — Progress Notes (Signed)
Cardiac Individual Treatment Plan  Patient Details  Name: Alex Brandt MRN: [id number] Date of Birth: 05/11/1976 Referring Provider:   Flowsheet Row Cardiac Rehab from 07/06/2022 in [facility] Cardiac and Pulmonary Rehab  Referring Provider Lujean Amel MD       Initial Encounter Date:  Flowsheet Row Cardiac Rehab from 07/06/2022 in [facility] Cardiac and Pulmonary Rehab  Date 07/06/22       Visit Diagnosis: ST elevation myocardial infarction (STEMI), unspecified artery ([place])  Patient's Home Medications on Admission:  Current Outpatient Medications:    aspirin 81 MG chewable tablet, 1 tablet Orally Once a day, Disp: , Rfl:    aspirin EC 81 MG tablet, Take 81 mg by mouth daily. Swallow whole. (Patient not taking: Reported on 06/20/2022), Disp: , Rfl:    metoprolol succinate (TOPROL-XL) 25 MG 24 hr tablet, Take 0.5 tablets (12.5 mg total) by mouth daily. (Patient not taking: Reported on 06/20/2022), Disp: 30 tablet, Rfl: 1   nicotine (NICODERM CQ - DOSED IN MG/24 HOURS) 21 mg/24hr patch, Place 1 patch (21 mg total) onto the skin daily., Disp: 28 patch, Rfl: 0   nicotine (NICODERM CQ) 21 mg/24hr patch, Place onto the skin. (Patient not taking: Reported on 06/20/2022), Disp: , Rfl:    nitroGLYCERIN (NITROSTAT) 0.4 MG SL tablet, Place 0.4 mg under the tongue every 5 (five) minutes as needed for chest pain., Disp: , Rfl:    omeprazole (PRILOSEC) 20 MG capsule, Take 20 mg by mouth every morning., Disp: , Rfl:    ondansetron (ZOFRAN-ODT) 4 MG disintegrating tablet, Take 1 tablet (4 mg total) by mouth every 6 (six) hours as needed for nausea or vomiting. (Patient not taking: Reported on 06/20/2022), Disp: 20 tablet, Rfl: 0   rosuvastatin (CRESTOR) 40 MG tablet, Take 1 tablet (40 mg total) by mouth every evening., Disp: 90 tablet, Rfl: 1   rosuvastatin (CRESTOR) 40 MG tablet, Take by mouth. (Patient not taking: Reported on 06/20/2022), Disp: , Rfl:    ticagrelor (BRILINTA) 90 MG TABS tablet, Take 1  tablet (90 mg total) by mouth 2 (two) times daily., Disp: 60 tablet, Rfl: 11   ticagrelor (BRILINTA) 90 MG TABS tablet, 1 tablet Orally Twice a day (Patient not taking: Reported on 06/20/2022), Disp: , Rfl:   Past Medical History: Past Medical History:  Diagnosis Date   Angina at rest [facility])    Arthritis    Coronary artery disease 05/05/2021   a.) LHC 05/05/2021: EF 55-65%; normal LV function and LVEDP; 50% mLAD, 50% D1, 50% p-mRCA; med mgmt.   DJD (degenerative joint disease) of knee    Dyspnea    GERD (gastroesophageal reflux disease)    HLD (hyperlipidemia)    Hyperkalemia    Hypertension    Migraines    Valvular insufficiency 12/18/2014   a.) TTE 12/18/2014: EF >55%; mild PR, moderate MR/TR.    Tobacco Use: Social History   Tobacco Use  Smoking Status Every Day   Packs/day: 0.50   Years: 20.00   Total pack years: 10.00   Types: Cigarettes  Smokeless Tobacco Never    Labs: Review Flowsheet       Latest Ref Rng & Units 06/02/2022  Labs for ITP Cardiac and Pulmonary Rehab  Cholestrol 0 - 200 mg/dL 178   LDL (calc) 0 - 99 mg/dL 124   HDL-C >40 mg/dL 45   Trlycerides <150 mg/dL 47   Hemoglobin A1c 4.8 - 5.6 % 5.3      Exercise Target Goals: Exercise Program Goal:  Individual exercise prescription set using results from initial 6 min walk test and THRR while considering  patient's activity barriers and safety.   Exercise Prescription Goal: Initial exercise prescription builds to 30-45 minutes a day of aerobic activity, 2-3 days per week.  Home exercise guidelines will be given to patient during program as part of exercise prescription that the participant will acknowledge.   Education: Aerobic Exercise: - Group verbal and visual presentation on the components of exercise prescription. Introduces F.I.T.T principle from ACSM for exercise prescriptions.  Reviews F.I.T.T. principles of aerobic exercise including progression. Written material given at  graduation. Flowsheet Row Cardiac Rehab from 07/13/2022 in [facility] Cardiac and Pulmonary Rehab  Education need identified 07/06/22       Education: Resistance Exercise: - Group verbal and visual presentation on the components of exercise prescription. Introduces F.I.T.T principle from ACSM for exercise prescriptions  Reviews F.I.T.T. principles of resistance exercise including progression. Written material given at graduation. Flowsheet Row Cardiac Rehab from 07/13/2022 in [facility] Cardiac and Pulmonary Rehab  Education need identified 07/06/22        Education: Exercise & Equipment Safety: - Individual verbal instruction and demonstration of equipment use and safety with use of the equipment. Flowsheet Row Cardiac Rehab from 06/20/2022 in [facility] Cardiac and Pulmonary Rehab  Date 06/20/22  Educator [facility]  Instruction Review Code 1- Verbalizes Understanding       Education: Exercise Physiology & General Exercise Guidelines: - Group verbal and written instruction with models to review the exercise physiology of the cardiovascular system and associated critical values. Provides general exercise guidelines with specific guidelines to those with heart or lung disease.    Education: Flexibility, Balance, Mind/Body Relaxation: - Group verbal and visual presentation with interactive activity on the components of exercise prescription. Introduces F.I.T.T principle from ACSM for exercise prescriptions. Reviews F.I.T.T. principles of flexibility and balance exercise training including progression. Also discusses the mind body connection.  Reviews various relaxation techniques to help reduce and manage stress (i.e. Deep breathing, progressive muscle relaxation, and visualization). Balance handout provided to take home. Written material given at graduation.   Activity Barriers & Risk Stratification:  Activity Barriers & Cardiac Risk Stratification - 07/06/22 1231       Activity Barriers & Cardiac Risk  Stratification   Activity Barriers Right Knee Replacement;Joint Problems             6 Minute Walk:  6 Minute Walk     Row Name 07/06/22 1231         6 Minute Walk   Phase Initial     Distance 1235 feet     Walk Time 6 minutes     # of Rest Breaks 0     MPH 2.33     METS 4.21     RPE 15     Perceived Dyspnea  0     VO2 Peak 14.73     Symptoms Yes (comment)     Comments Right knee pain 6/10     Resting HR 71 bpm     Resting BP 102/60     Resting Oxygen Saturation  99 %     Exercise Oxygen Saturation  during 6 min walk 99 %     Max Ex. HR 92 bpm     Max Ex. BP 110/60     2 Minute Post BP 98/62              Oxygen Initial Assessment:   Oxygen Re-Evaluation:  Oxygen Discharge (Final Oxygen Re-Evaluation):   Initial Exercise Prescription:  Initial Exercise Prescription - 07/06/22 1200       Date of Initial Exercise RX and Referring Provider   Date 07/06/22    Referring Provider Lujean Amel MD      Oxygen   Maintain Oxygen Saturation 88% or higher      Treadmill   MPH 2.1    Grade 0.5    Minutes 15    METs 2.75      Recumbant Bike   Level 2    RPM 60    Watts 35    Minutes 15    METs 4      NuStep   Level 3    SPM 80    Minutes 15    METs 4      Prescription Details   Frequency (times per week) 2    Duration Progress to 30 minutes of continuous aerobic without signs/symptoms of physical distress      Intensity   THRR 40-80% of Max Heartrate 113 - 155    Ratings of Perceived Exertion 11-13    Perceived Dyspnea 0-4      Progression   Progression Continue to progress workloads to maintain intensity without signs/symptoms of physical distress.      Resistance Training   Training Prescription Yes    Weight 7 lb             Perform Capillary Blood Glucose checks as needed.  Exercise Prescription Changes:   Exercise Prescription Changes     Row Name 07/06/22 1200 07/19/22 1400           Response to Exercise    Blood Pressure (Admit) 102/60 100/58      Blood Pressure (Exercise) 110/60 142/78      Blood Pressure (Exit) 98/62 108/62      Heart Rate (Admit) 71 bpm 65 bpm      Heart Rate (Exercise) 92 bpm 130 bpm      Heart Rate (Exit) 73 bpm 104 bpm      Oxygen Saturation (Admit) 99 % --      Oxygen Saturation (Exercise) 99 % --      Oxygen Saturation (Exit) 99 % --      Rating of Perceived Exertion (Exercise) 15 12      Perceived Dyspnea (Exercise) 0 0      Symptoms Right knee pain 6/10 none      Comments walk test results --      Duration -- Continue with 30 min of aerobic exercise without signs/symptoms of physical distress.      Intensity -- THRR unchanged        Progression   Progression -- Continue to progress workloads to maintain intensity without signs/symptoms of physical distress.      Average METs -- 3.23        Resistance Training   Training Prescription -- Yes      Weight -- 7 lb      Reps -- 10-15        Interval Training   Interval Training -- No        Treadmill   MPH -- 2.5      Grade -- 1.5      Minutes -- 15      METs -- 3.43        Recumbant Bike   Level -- 4      Watts -- 41  Minutes -- 15      METs -- 3.34        NuStep   Level -- 3      Minutes -- 15      METs -- 3.4        Oxygen   Maintain Oxygen Saturation -- 88% or higher               Exercise Comments:   Exercise Comments     Row Name 07/11/22 1014           Exercise Comments First full day of exercise!  Patient was oriented to gym and equipment including functions, settings, policies, and procedures.  Patient's individual exercise prescription and treatment plan were reviewed.  All starting workloads were established based on the results of the 6 minute walk test done at initial orientation visit.  The plan for exercise progression was also introduced and progression will be customized based on patient's performance and goals.                Exercise Goals and Review:    Exercise Goals     Row Name 07/06/22 1241             Exercise Goals   Increase Physical Activity Yes       Intervention Provide advice, education, support and counseling about physical activity/exercise needs.;Develop an individualized exercise prescription for aerobic and resistive training based on initial evaluation findings, risk stratification, comorbidities and participant's personal goals.       Expected Outcomes Short Term: Attend rehab on a regular basis to increase amount of physical activity.;Long Term: Add in home exercise to make exercise part of routine and to increase amount of physical activity.;Long Term: Exercising regularly at least 3-5 days a week.       Increase Strength and Stamina Yes       Intervention Provide advice, education, support and counseling about physical activity/exercise needs.;Develop an individualized exercise prescription for aerobic and resistive training based on initial evaluation findings, risk stratification, comorbidities and participant's personal goals.       Expected Outcomes Short Term: Increase workloads from initial exercise prescription for resistance, speed, and METs.;Short Term: Perform resistance training exercises routinely during rehab and add in resistance training at home;Long Term: Improve cardiorespiratory fitness, muscular endurance and strength as measured by increased METs and functional capacity (6MWT)       Able to understand and use rate of perceived exertion (RPE) scale Yes       Intervention Provide education and explanation on how to use RPE scale       Expected Outcomes Short Term: Able to use RPE daily in rehab to express subjective intensity level;Long Term:  Able to use RPE to guide intensity level when exercising independently       Able to understand and use Dyspnea scale Yes       Intervention Provide education and explanation on how to use Dyspnea scale       Expected Outcomes Short Term: Able to use Dyspnea scale  daily in rehab to express subjective sense of shortness of breath during exertion;Long Term: Able to use Dyspnea scale to guide intensity level when exercising independently       Knowledge and understanding of Target Heart Rate Range (THRR) Yes       Intervention Provide education and explanation of THRR including how the numbers were predicted and where they are located for reference       Expected  Outcomes Short Term: Able to state/look up THRR;Long Term: Able to use THRR to govern intensity when exercising independently;Short Term: Able to use daily as guideline for intensity in rehab       Able to check pulse independently Yes       Intervention Provide education and demonstration on how to check pulse in carotid and radial arteries.;Review the importance of being able to check your own pulse for safety during independent exercise       Expected Outcomes Short Term: Able to explain why pulse checking is important during independent exercise;Long Term: Able to check pulse independently and accurately       Understanding of Exercise Prescription Yes       Intervention Provide education, explanation, and written materials on patient's individual exercise prescription       Expected Outcomes Short Term: Able to explain program exercise prescription;Long Term: Able to explain home exercise prescription to exercise independently                Exercise Goals Re-Evaluation :  Exercise Goals Re-Evaluation     Row Name 07/11/22 1014 07/19/22 1426 08/02/22 1456         Exercise Goal Re-Evaluation   Exercise Goals Review Able to understand and use rate of perceived exertion (RPE) scale;Able to understand and use Dyspnea scale;Knowledge and understanding of Target Heart Rate Range (THRR);Understanding of Exercise Prescription Increase Physical Activity;Increase Strength and Stamina;Understanding of Exercise Prescription Increase Physical Activity;Increase Strength and Stamina;Understanding of  Exercise Prescription     Comments Reviewed RPE and dyspnea scales, THR and program prescription with pt today.  Pt voiced understanding and was given a copy of goals to take home. Alex Brandt is off to a good start in rehab. He had an overall average MET level of 3.23 METs. He also did well on the treadmill at a speed of 2.5 mph and an incline on 1.5%. He tolerated level 4 on the recumbent bike and level 3 on the T4 as well. We will continue to monitor his progress in the program. Patient has not attended rehab since 10/4 due to having persistent knee pain. When staff spoke with him last, he was wanting to rest it over the weekend and try again the following week, he was supposed to call but have not heard from him since. Staff will continue to try to follow up with patient.     Expected Outcomes Short: Use RPE daily to regulate intensity. Long: Follow program prescription in THR. Short: Continue to follow current exercise prescription and begin to increase workloads. Long: Continue to increase strength and stamina. Short: Return to rehab and get back into routine Long: Continue to build up overall METS              Discharge Exercise Prescription (Final Exercise Prescription Changes):  Exercise Prescription Changes - 07/19/22 1400       Response to Exercise   Blood Pressure (Admit) 100/58    Blood Pressure (Exercise) 142/78    Blood Pressure (Exit) 108/62    Heart Rate (Admit) 65 bpm    Heart Rate (Exercise) 130 bpm    Heart Rate (Exit) 104 bpm    Rating of Perceived Exertion (Exercise) 12    Perceived Dyspnea (Exercise) 0    Symptoms none    Duration Continue with 30 min of aerobic exercise without signs/symptoms of physical distress.    Intensity THRR unchanged      Progression   Progression Continue to progress workloads  to maintain intensity without signs/symptoms of physical distress.    Average METs 3.23      Resistance Training   Training Prescription Yes    Weight 7 lb    Reps  10-15      Interval Training   Interval Training No      Treadmill   MPH 2.5    Grade 1.5    Minutes 15    METs 3.43      Recumbant Bike   Level 4    Watts 41    Minutes 15    METs 3.34      NuStep   Level 3    Minutes 15    METs 3.4      Oxygen   Maintain Oxygen Saturation 88% or higher             Nutrition:  Target Goals: Understanding of nutrition guidelines, daily intake of sodium <1535m, cholesterol <2066m calories 30% from fat and 7% or less from saturated fats, daily to have 5 or more servings of fruits and vegetables.  Education: All About Nutrition: -Group instruction provided by verbal, written material, interactive activities, discussions, models, and posters to present general guidelines for heart healthy nutrition including fat, fiber, MyPlate, the role of sodium in heart healthy nutrition, utilization of the nutrition label, and utilization of this knowledge for meal planning. Follow up email sent as well. Written material given at graduation. [facility]  Education need identified 07/06/22       Biometrics:  Pre Biometrics - 07/06/22 1229       Pre Biometrics   Height 6' 3.5" (1.918 m)    Weight 211 lb 8 oz (95.9 kg)    Waist Circumference 40.5 inches    Hip Circumference 42 inches    Waist to Hip Ratio 0.96 %    BMI (Calculated) 26.08    Single Leg Stand 30 seconds              Nutrition Therapy Plan and Nutrition Goals:  Nutrition Therapy & Goals - 07/19/22 0804       Nutrition Therapy   Diet Heart healthy, low Na    Drug/Food Interactions Statins/Certain Fruits    Protein (specify units) 75-80g    Fiber 33 grams    Whole Grain Foods 3 servings    Saturated Fats 18 max. grams    Fruits and Vegetables 8 servings/day    Sodium 2 grams      Personal Nutrition Goals   Nutrition Goal ST: review paperwork. replace saturated fat with unsaturated fat foods such  as nuts/seeds, avocado, and liquid plant oil. include ground flaxseeds, chia seeds, or walnuts daily. LT: limit saturated fat <18g/day, limit sodium <2g/day, meet fiber recommendations of at least 33g/day.    Comments [age].o. M admitted to cardiac rehab s/p STEMI. PMHx includes CAD, HTN, HLD, current tobacco use. PSHx includes cholecystectomy. Relevant medications includes omeprazole, crestor. Most recent lipid panel 06/02/22: LDL elevated 124, lipoprotein A tested on 06/03/22 was 255.3. PYP Score: 47.  Vegetables & Fruits 7/12. Breads, Grains & Cereals 6/12. Red & Processed Meat 5/12. Poultry 0/2. Fish & Shellfish 0/4. Beans, Nuts & Seeds 2/4. Milk & Dairy Foods 2/6. Toppings, Oils, Seasonings & Salt 9/20. Sweets, Snacks & Restaurant Food 8/14. Beverages 8/10.  B: eggs with 1-2 strips of bacon with oatmeal L: pack of nabs D: mashed potatoes, green beans, and some meat (pork chop,  chicken, venison and other red meat). He reports enjoying greens and salads, bell peppers, onions from their garden. Discussed heart healthy eating and encouraged to keep variety of plant foods, especially high soluble fiber food items to help with higher LDL cholesterol such as oats, apples, citrus fruits, beans/lentils, and barley. Alex Brandt reports that his stomach does not like omega-3 supplements or fish and can cause him to feel ill; discussed how omega-3 EPA/DHA that is correlated with improved heart health is predominately found in fish and supplements. Essential Omega-3 ALA does has a low conversion rate to EPA/DHA, but will still be good to include as it is essential and there can be small amountd of EPA and DHA that he will be able to get from that; some sources are chia seeds, ground flaxseeds, and walnuts which he could add to his daily oatmeal. 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 Aside from pork, Alex Brandt also enjoys lean red meat like venison; reviewed red meat recommendations for heart healthy and considerations. Reviewed low sodium MNT and general heart healthy eating.      Intervention Plan   Intervention Prescribe, educate and counsel regarding individualized specific dietary modifications aiming towards targeted core components such as weight, hypertension, lipid management, diabetes, heart failure and other comorbidities.    Expected Outcomes Short Term Goal: Understand basic principles of dietary content, such as calories, fat, sodium, cholesterol and nutrients.;Short Term Goal: A plan has been developed with personal nutrition goals set during dietitian appointment.;Long Term Goal: Adherence to prescribed nutrition plan.             Nutrition Assessments:  MEDIFICTS Score Key: ?70 Need to make dietary changes  40-70 Heart Healthy Diet ? 40 Therapeutic Level Cholesterol Diet  Flowsheet Row Cardiac Rehab from 07/06/2022 in [facility] Cardiac and Pulmonary Rehab  Picture Your Plate Total Score on Admission 47      Picture Your Plate Scores: <09 Unhealthy dietary pattern with much room for improvement. 41-50 Dietary pattern unlikely to meet recommendations for good health and room for improvement. 51-60 More healthful dietary pattern, with some room for improvement.  >60 Healthy dietary pattern, although there may be some specific behaviors that could be improved.    Nutrition Goals Re-Evaluation:   Nutrition Goals Discharge (Final Nutrition Goals Re-Evaluation):   Psychosocial: Target Goals: Acknowledge presence or absence of significant depression and/or stress, maximize coping skills, provide positive support system. Participant is able to verbalize types and ability to use techniques and skills needed for reducing stress and depression.   Education: Stress, Anxiety, and Depression - Group verbal  and visual presentation to define topics covered.  Reviews how body is impacted by stress, anxiety, and depression.  Also discusses healthy ways to reduce stress and to treat/manage anxiety and depression.  Written material given at graduation. Flowsheet Row Cardiac Rehab from 07/13/2022 in [facility] Cardiac and Pulmonary Rehab  Education need identified 07/06/22       Education: Sleep Hygiene -Provides group verbal and written instruction about how sleep can affect your health.  Define sleep hygiene, discuss sleep cycles and impact of sleep habits. Review good sleep hygiene tips.    Initial Review & Psychosocial Screening:  Initial Psych Review & Screening - 06/20/22 0955       Initial  Review   Current issues with None Identified      Family Dynamics   Good Support System? Yes    Comments He has a positive outlook on his health wants to quit smoking and his wife is a great supporter.      Barriers   Psychosocial barriers to participate in program The patient should benefit from training in stress management and relaxation.      Screening Interventions   Interventions Encouraged to exercise;To provide support and resources with identified psychosocial needs;Provide feedback about the scores to participant    Expected Outcomes Short Term goal: Utilizing psychosocial counselor, staff and physician to assist with identification of specific Stressors or current issues interfering with healing process. Setting desired goal for each stressor or current issue identified.;Long Term Goal: Stressors or current issues are controlled or eliminated.;Short Term goal: Identification and review with participant of any Quality of Life or Depression concerns found by scoring the questionnaire.;Long Term goal: The participant improves quality of Life and PHQ9 Scores as seen by post scores and/or verbalization of changes             Quality of Life Scores:   Quality of Life - 07/06/22 1213       Quality  of Life   Select Quality of Life      Quality of Life Scores   Health/Function Pre 24 %    Socioeconomic Pre 21 %    Psych/Spiritual Pre 27.43 %    Family Pre 27.6 %    GLOBAL Pre 24.51 %            Scores of 19 and below usually indicate a poorer quality of life in these areas.  A difference of  2-3 points is a clinically meaningful difference.  A difference of 2-3 points in the total score of the Quality of Life Index has been associated with significant improvement in overall quality of life, self-image, physical symptoms, and general health in studies assessing change in quality of life.  PHQ-9: Review Flowsheet       07/06/2022  Depression screen PHQ 2/9  Decreased Interest 1  Down, Depressed, Hopeless 0  PHQ - 2 Score 1  Altered sleeping 2  Tired, decreased energy 2  Change in appetite 2  Feeling bad or failure about yourself  0  Trouble concentrating 0  Moving slowly or fidgety/restless 0  Suicidal thoughts 0  PHQ-9 Score 7  Difficult doing work/chores Somewhat difficult   Interpretation of Total Score  Total Score Depression Severity:  1-4 = Minimal depression, 5-9 = Mild depression, 10-14 = Moderate depression, 15-19 = Moderately severe depression, 20-27 = Severe depression   Psychosocial Evaluation and Intervention:   Psychosocial Re-Evaluation:  Psychosocial Re-Evaluation     [place] Name 06/20/22 1005             Psychosocial Re-Evaluation   Current issues with Current Stress Concerns;None Identified       Comments He has a positive outlook on his health wants to quit smoking and his wife is a great supporter.       Expected Outcomes Short: Start HeartTrack to help with mood. Long: Maintain a healthy mental state       Interventions Encouraged to attend Cardiac Rehabilitation for the exercise       Continue Psychosocial Services  Follow up required by staff                Psychosocial Discharge (Final Psychosocial Re-Evaluation):  Psychosocial  Re-Evaluation - 06/20/22 1005       Psychosocial Re-Evaluation   Current issues with Current Stress Concerns;None Identified    Comments He has a positive outlook on his health wants to quit smoking and his wife is a great supporter.    Expected Outcomes Short: Start HeartTrack to help with mood. Long: Maintain a healthy mental state    Interventions Encouraged to attend Cardiac Rehabilitation for the exercise    Continue Psychosocial Services  Follow up required by staff             Vocational Rehabilitation: Provide vocational rehab assistance to qualifying candidates.   Vocational Rehab Evaluation & Intervention:   Education: Education Goals: Education classes will be provided on a variety of topics geared toward better understanding of heart health and risk factor modification. Participant will state understanding/return demonstration of topics presented as noted by education test scores.  Learning Barriers/Preferences:  Learning Barriers/Preferences - 06/20/22 0954       Learning Barriers/Preferences   Learning Barriers None    Learning Preferences None             General Cardiac Education Topics:  AED/CPR: - Group verbal and written instruction with the use of models to demonstrate the basic use of the AED with the basic ABC's of resuscitation.   Anatomy and Cardiac Procedures: - Group verbal and visual presentation and models provide information about basic cardiac anatomy and function. Reviews the testing methods done to diagnose heart disease and the outcomes of the test results. Describes the treatment choices: Medical Management, Angioplasty, or Coronary Bypass Surgery for treating various heart conditions including Myocardial Infarction, Angina, Valve Disease, and Cardiac Arrhythmias.  Written material given at graduation. Flowsheet Row Cardiac Rehab from 07/13/2022 in [facility] Cardiac and Pulmonary Rehab  Education need identified 07/06/22        Medication Safety: - Group verbal and visual instruction to review commonly prescribed medications for heart and lung disease. Reviews the medication, class of the drug, and side effects. Includes the steps to properly store meds and maintain the prescription regimen.  Written material given at graduation. Flowsheet Row Cardiac Rehab from 07/13/2022 in [facility] Cardiac and Pulmonary Rehab  Date 07/13/22  Educator SB  Instruction Review Code 1- Verbalizes Understanding       Intimacy: - Group verbal instruction through game format to discuss how heart and lung disease can affect sexual intimacy. Written material given at graduation..   Know Your Numbers and Heart Failure: - Group verbal and visual instruction to discuss disease risk factors for cardiac and pulmonary disease and treatment options.  Reviews associated critical values for Overweight/Obesity, Hypertension, Cholesterol, and Diabetes.  Discusses basics of heart failure: signs/symptoms and treatments.  Introduces Heart Failure Zone chart for action plan for heart failure.  Written material given at graduation. Flowsheet Row Cardiac Rehab from 07/13/2022 in [facility] Cardiac and Pulmonary Rehab  Education need identified 07/06/22       Infection Prevention: - Provides verbal and written material to individual with discussion of infection control including proper hand washing and proper equipment cleaning during exercise session. Flowsheet Row Cardiac Rehab from 06/20/2022 in [facility] Cardiac and Pulmonary Rehab  Date 06/20/22  Educator [facility]  Instruction Review Code 1- Verbalizes Understanding       Falls Prevention: - Provides verbal and written material to individual with discussion of falls prevention and safety. Flowsheet Row Cardiac Rehab from 06/20/2022 in [facility] Cardiac and Pulmonary Rehab  Date 06/20/22  Educator [facility]  Instruction Review Code 1- Verbalizes Understanding       Other: -Provides group and verbal instruction on  various topics (see comments)   Knowledge Questionnaire Score:  Knowledge Questionnaire Score - 07/06/22 1211       Knowledge Questionnaire Score   Pre Score 15/26             Core Components/Risk Factors/Patient Goals at Admission:  Personal Goals and Risk Factors at Admission - 07/06/22 1242       Core Components/Risk Factors/Patient Goals on Admission    Weight Management Yes;Weight Loss    Intervention Weight Management: Develop a combined nutrition and exercise program designed to reach desired caloric intake, while maintaining appropriate intake of nutrient and fiber, sodium and fats, and appropriate energy expenditure required for the weight goal.;Weight Management: Provide education and appropriate resources to help participant work on and attain dietary goals.;Weight Management/Obesity: Establish reasonable short term and long term weight goals.    Admit Weight 211 lb (95.7 kg)    Goal Weight: Short Term 206 lb (93.4 kg)    Goal Weight: Long Term 200 lb (90.7 kg)    Expected Outcomes Short Term: Continue to assess and modify interventions until short term weight is achieved;Long Term: Adherence to nutrition and physical activity/exercise program aimed toward attainment of established weight goal;Weight Loss: Understanding of general recommendations for a balanced deficit meal plan, which promotes 1-2 lb weight loss per week and includes a negative energy balance of [phone] kcal/d;Understanding of distribution of calorie intake throughout the day with the consumption of 4-5 meals/snacks;Understanding recommendations for meals to include 15-35% energy as protein, 25-35% energy from fat, 35-60% energy from carbohydrates, less than 255m of dietary cholesterol, 20-35 gm of total fiber daily    Tobacco Cessation Yes    Number of packs per day .5    Intervention Assist the participant in steps to quit. Provide individualized education and counseling about committing to Tobacco  Cessation, relapse prevention, and pharmacological support that can be provided by physician.;OAdvice worker assist with locating and accessing local/national Quit Smoking programs, and support quit date choice.    Expected Outcomes Short Term: Will demonstrate readiness to quit, by selecting a quit date.;Short Term: Will quit all tobacco product use, adhering to prevention of relapse plan.;Long Term: Complete abstinence from all tobacco products for at least 12 months from quit date.    Hypertension Yes    Intervention Provide education on lifestyle modifcations including regular physical activity/exercise, weight management, moderate sodium restriction and increased consumption of fresh fruit, vegetables, and low fat dairy, alcohol moderation, and smoking cessation.;Monitor prescription use compliance.    Expected Outcomes Short Term: Continued assessment and intervention until BP is < 140/925mHG in hypertensive participants. < 130/8039mG in hypertensive participants with diabetes, heart failure or chronic kidney disease.;Long Term: Maintenance of blood pressure at goal levels.    Lipids Yes    Intervention Provide education and support for participant on nutrition & aerobic/resistive exercise along with prescribed medications to achieve LDL <21m107mDL >40mg12m Expected Outcomes Short Term: Participant states understanding of desired cholesterol values and is compliant with medications prescribed. Participant is following exercise prescription and nutrition guidelines.;Long Term: Cholesterol controlled with medications as prescribed, with individualized exercise RX and with personalized nutrition plan. Value goals: LDL < 21mg,81m > 40 mg.             Education:Diabetes - Individual verbal and written instruction to review signs/symptoms of diabetes, desired ranges  of glucose level fasting, after meals and with exercise. Acknowledge that pre and post exercise glucose checks will  be done for 3 sessions at entry of program.   Core Components/Risk Factors/Patient Goals Review:    Core Components/Risk Factors/Patient Goals at Discharge (Final Review):    ITP Comments:  ITP Comments     Row Name 06/20/22 0956 07/06/22 1209 07/06/22 1239 07/11/22 1014 07/13/22 0801   ITP Comments Virtual Visit completed. Patient informed on EP and RD appointment and 6 Minute walk test. Patient also informed of patient health questionnaires on My Chart. Patient Verbalizes understanding. Visit diagnosis can be found in [facility]  06/02/2022. Completed 6MWT and gym orientation. Initial ITP created and sent for review to Dr. Emily Filbert, Medical Director. Alex Brandt is a current tobacco user. Intervention for tobacco cessation was provided at the initial medical review. He was asked about readiness to quit and reported that he would like to quit but has no quit date. He did not feel that nicotine patches were helping. He currently smokes about 8 cigarettes/day, versus what used to be 2 packs/day. Patient was advised and educated about tobacco cessation using combination therapy, tobacco cessation classes, quit line, and quit smoking apps. Patient demonstrated understanding of this material. Staff will continue to provide encouragement and follow up with the patient throughout the program. First full day of exercise!  Patient was oriented to gym and equipment including functions, settings, policies, and procedures.  Patient's individual exercise prescription and treatment plan were reviewed.  All starting workloads were established based on the results of the 6 minute walk test done at initial orientation visit.  The plan for exercise progression was also introduced and progression will be customized based on patient's performance and goals. 30 Day review completed. Medical Director ITP review done, changes made as directed, and signed approval by Medical Director.    New to program    Row Name 07/19/22 0847  07/27/22 1141 08/10/22 1033 08/16/22 1138     ITP Comments Completed initial RD consultation Patient has not been here since 10/4. Patient states his knee has been hurting him and its hard to do the exercises. Staff advised patient we can work with him on specific machines that can accommodate him and his knee pain (arm crank, etc). Patient wants to wait it out the weekend to see if his knee is feeling better and will call us next week on what he decides to do. 30 Day review completed. Medical Director ITP review done, changes made as directed, and signed approval by Medical Director.    NO visit since 10/4 Alex Brandt would like to be discharged at this time as his knee will need further testing to see what is wrong.             Comments: Discharge ITP

## 2022-12-25 ENCOUNTER — Emergency Department: Payer: Medicaid Other

## 2022-12-25 ENCOUNTER — Encounter: Payer: Self-pay | Admitting: Emergency Medicine

## 2022-12-25 ENCOUNTER — Other Ambulatory Visit: Payer: Self-pay

## 2022-12-25 ENCOUNTER — Observation Stay
Admission: EM | Admit: 2022-12-25 | Discharge: 2022-12-27 | Disposition: A | Discharge status: Home / Self Care | Payer: Medicaid Other | Attending: Internal Medicine | Admitting: Internal Medicine

## 2022-12-25 DIAGNOSIS — Z7982 Long term (current) use of aspirin: Secondary | ICD-10-CM | POA: Diagnosis not present

## 2022-12-25 DIAGNOSIS — R52 Pain, unspecified: Secondary | ICD-10-CM

## 2022-12-25 DIAGNOSIS — G8929 Other chronic pain: Secondary | ICD-10-CM | POA: Insufficient documentation

## 2022-12-25 DIAGNOSIS — Z951 Presence of aortocoronary bypass graft: Secondary | ICD-10-CM | POA: Insufficient documentation

## 2022-12-25 DIAGNOSIS — Z79899 Other long term (current) drug therapy: Secondary | ICD-10-CM | POA: Diagnosis not present

## 2022-12-25 DIAGNOSIS — F17200 Nicotine dependence, unspecified, uncomplicated: Secondary | ICD-10-CM | POA: Insufficient documentation

## 2022-12-25 DIAGNOSIS — K219 Gastro-esophageal reflux disease without esophagitis: Secondary | ICD-10-CM | POA: Diagnosis present

## 2022-12-25 DIAGNOSIS — I10 Essential (primary) hypertension: Secondary | ICD-10-CM | POA: Diagnosis present

## 2022-12-25 DIAGNOSIS — Z96651 Presence of right artificial knee joint: Secondary | ICD-10-CM | POA: Insufficient documentation

## 2022-12-25 DIAGNOSIS — Z7902 Long term (current) use of antithrombotics/antiplatelets: Secondary | ICD-10-CM | POA: Insufficient documentation

## 2022-12-25 DIAGNOSIS — I2111 ST elevation (STEMI) myocardial infarction involving right coronary artery: Secondary | ICD-10-CM | POA: Diagnosis present

## 2022-12-25 DIAGNOSIS — I2510 Atherosclerotic heart disease of native coronary artery without angina pectoris: Secondary | ICD-10-CM | POA: Insufficient documentation

## 2022-12-25 DIAGNOSIS — M545 Low back pain, unspecified: Secondary | ICD-10-CM | POA: Diagnosis not present

## 2022-12-25 DIAGNOSIS — M5136 Other intervertebral disc degeneration, lumbar region: Secondary | ICD-10-CM

## 2022-12-25 MED ORDER — NITROGLYCERIN 0.4 MG SL SUBL: 0.4000 mg | SUBLINGUAL_TABLET | SUBLINGUAL | Status: DC | PRN

## 2022-12-25 MED ORDER — CYCLOBENZAPRINE HCL 10 MG PO TABS
5.0000 mg | ORAL_TABLET | Freq: Three times a day (TID) | ORAL | Status: DC | PRN
Start: 2022-12-25 — End: 2022-12-27
  Administered 2022-12-26 – 2022-12-27 (×5): 5 mg via ORAL
  Filled 2022-12-25 (×5): qty 1

## 2022-12-25 MED ORDER — PANTOPRAZOLE SODIUM 40 MG PO TBEC
40.0000 mg | DELAYED_RELEASE_TABLET | Freq: Every day | ORAL | Status: DC
  Administered 2022-12-26 – 2022-12-27 (×2): 40 mg via ORAL
  Filled 2022-12-25 (×2): qty 1

## 2022-12-25 MED ORDER — HYDROMORPHONE HCL 1 MG/ML IJ SOLN
1.0000 mg | Freq: Once | INTRAMUSCULAR | Status: AC
  Administered 2022-12-25: 1 mg via INTRAVENOUS
  Filled 2022-12-25: qty 1

## 2022-12-25 MED ORDER — METHOCARBAMOL 500 MG PO TABS: ORAL_TABLET | ORAL | 0 refills | Status: DC

## 2022-12-25 MED ORDER — ACETAMINOPHEN 650 MG RE SUPP: 650.0000 mg | Freq: Four times a day (QID) | RECTAL | Status: DC | PRN

## 2022-12-25 MED ORDER — DEXAMETHASONE SODIUM PHOSPHATE 10 MG/ML IJ SOLN
10.0000 mg | Freq: Once | INTRAMUSCULAR | Status: AC
  Administered 2022-12-25: 10 mg via INTRAVENOUS
  Filled 2022-12-25: qty 1

## 2022-12-25 MED ORDER — CLOPIDOGREL BISULFATE 75 MG PO TABS
75.0000 mg | ORAL_TABLET | Freq: Every day | ORAL | Status: DC
  Administered 2022-12-26 – 2022-12-27 (×2): 75 mg via ORAL
  Filled 2022-12-25 (×2): qty 1

## 2022-12-25 MED ORDER — OXYCODONE HCL 5 MG PO TABS: 5.0000 mg | ORAL_TABLET | Freq: Four times a day (QID) | ORAL | 0 refills | Status: DC | PRN

## 2022-12-25 MED ORDER — NICOTINE 21 MG/24HR TD PT24
21.0000 mg | MEDICATED_PATCH | Freq: Every day | TRANSDERMAL | Status: DC
  Administered 2022-12-26 – 2022-12-27 (×2): 21 mg via TRANSDERMAL
  Filled 2022-12-25 (×3): qty 1

## 2022-12-25 MED ORDER — LIDOCAINE 5 % EX PTCH: 1.0000 | MEDICATED_PATCH | Freq: Two times a day (BID) | CUTANEOUS | 0 refills | Status: DC

## 2022-12-25 MED ORDER — SODIUM CHLORIDE 0.9% FLUSH
3.0000 mL | Freq: Two times a day (BID) | INTRAVENOUS | Status: DC
  Administered 2022-12-26 – 2022-12-27 (×3): 3 mL via INTRAVENOUS

## 2022-12-25 MED ORDER — HYDROMORPHONE HCL 1 MG/ML IJ SOLN
0.5000 mg | INTRAMUSCULAR | Status: AC | PRN
  Administered 2022-12-26 (×3): 0.5 mg via INTRAVENOUS
  Filled 2022-12-25 (×3): qty 0.5

## 2022-12-25 MED ORDER — SODIUM CHLORIDE 0.9 % IV SOLN: INTRAVENOUS | Status: DC

## 2022-12-25 MED ORDER — ROSUVASTATIN CALCIUM 10 MG PO TABS
40.0000 mg | ORAL_TABLET | Freq: Every day | ORAL | Status: DC
  Administered 2022-12-26 (×2): 40 mg via ORAL
  Filled 2022-12-25 (×3): qty 2
  Filled 2022-12-25: qty 4

## 2022-12-25 MED ORDER — ALBUTEROL SULFATE (2.5 MG/3ML) 0.083% IN NEBU
2.5000 mg | INHALATION_SOLUTION | Freq: Four times a day (QID) | RESPIRATORY_TRACT | Status: DC
  Filled 2022-12-25: qty 3

## 2022-12-25 MED ORDER — METHOCARBAMOL 500 MG PO TABS
1000.0000 mg | ORAL_TABLET | Freq: Once | ORAL | Status: AC
  Administered 2022-12-25: 1000 mg via ORAL
  Filled 2022-12-25: qty 2

## 2022-12-25 MED ORDER — ACETAMINOPHEN 325 MG PO TABS
650.0000 mg | ORAL_TABLET | Freq: Four times a day (QID) | ORAL | Status: DC | PRN
  Administered 2022-12-26: 650 mg via ORAL
  Filled 2022-12-25: qty 2

## 2022-12-25 MED ORDER — KETOROLAC TROMETHAMINE 30 MG/ML IJ SOLN
30.0000 mg | Freq: Once | INTRAMUSCULAR | Status: DC
  Filled 2022-12-25: qty 1

## 2022-12-25 MED ORDER — MORPHINE SULFATE (PF) 4 MG/ML IV SOLN
4.0000 mg | Freq: Once | INTRAVENOUS | Status: AC
  Administered 2022-12-25: 4 mg via INTRAVENOUS
  Filled 2022-12-25: qty 1

## 2022-12-25 MED ORDER — ASPIRIN 81 MG PO TBEC
81.0000 mg | DELAYED_RELEASE_TABLET | Freq: Every day | ORAL | Status: DC
  Administered 2022-12-27: 81 mg via ORAL
  Filled 2022-12-25 (×2): qty 1

## 2022-12-25 MED ORDER — HEPARIN SODIUM (PORCINE) 5000 UNIT/ML IJ SOLN
5000.0000 [IU] | Freq: Three times a day (TID) | INTRAMUSCULAR | Status: DC
  Administered 2022-12-26 – 2022-12-27 (×5): 5000 [IU] via SUBCUTANEOUS
  Filled 2022-12-25 (×5): qty 1

## 2022-12-25 MED ORDER — ISOSORBIDE MONONITRATE ER 30 MG PO TB24
30.0000 mg | ORAL_TABLET | Freq: Every day | ORAL | Status: DC
  Administered 2022-12-26 – 2022-12-27 (×2): 30 mg via ORAL
  Filled 2022-12-25 (×2): qty 1

## 2022-12-25 MED ORDER — PREDNISONE 10 MG PO TABS: ORAL_TABLET | ORAL | 0 refills | Status: DC

## 2022-12-25 NOTE — Assessment & Plan Note (Signed)
-  Nicotine patch 

## 2022-12-25 NOTE — Assessment & Plan Note (Signed)
No chest pain. Cont aspirin 81 mg. Cont toprol XL 25 mg.  Cont rosuvastatin 40 mg.

## 2022-12-25 NOTE — ED Triage Notes (Signed)
Pt in via EMS from home with c/o back pain.  Pt got up yesterday to move, felt a pop in his back and has not been able to get around since then. Pt has been laying flat with EMS the entire time. Pt with c/o back pain

## 2022-12-25 NOTE — ED Provider Notes (Signed)
-----------------------------------------   9:54 PM on 12/25/2022 -----------------------------------------  Blood pressure 126/80, pulse 73, temperature 98 F (36.7 C), resp. rate 19, SpO2 98 %.  Assuming care from Dr. Charna Archer.  In short, Alex Brandt is a 47 y.o. male with a chief complaint of Back Pain .  Refer to the original H&P for additional details.  The current plan of care is to await MRI.  Patient's MRI returns with bulging disc without acute disc and cord compression.  Patient has no neurodeficits of bowel or bladder dysfunction, saddle anesthesia or paresthesias.  He has ongoing significant pain.  Patient has been laying flat on his back, will not stand, would not sit.  Patient has had multiple rounds of pain medication at this time.  Patient refused discharged earlier, states that there is no way that he can ride in a vehicle home.  At this time I will reach out to the hospitalist team to see if they will admit for pain management.Marland Kitchen  Hospitalist agrees to admit the patient at this time.    ED diagnosis:  Bulging disc Low back pain Uncontrolled pain    Brynda Peon 12/26/22 0002    Blake Divine, MD 12/26/22 1517

## 2022-12-25 NOTE — ED Provider Notes (Signed)
-----------------------------------------   5:15 PM on 12/25/2022 ----------------------------------------- Patient currently pending discharge following evaluation for acute on chronic back pain.  He is now stating that he is in too much pain to even sit up, is concerned that he cannot control his flow of urine.  He does not have any objective weakness in his lower extremities, but does complain of numbness in his groin.  We will further assess with MRI for acute myelopathy, treat ongoing pain with IV Dilaudid.  ----------------------------------------- 7:42 PM on 12/25/2022 ----------------------------------------- Patient turned over to oncoming provider pending MRI results.   Blake Divine, MD 12/25/22 [phone]

## 2022-12-25 NOTE — Assessment & Plan Note (Signed)
Pt had LBP since yesterday and MRI today shows bulging disc. No alarm symptoms. Mri shows: IMPRESSION: 1. Worsened small central disc protrusion at L4-L5 with narrowing of both lateral recesses 2. Unchanged small central disc protrusions at L3-4 and L5-S1 without spinal canal or neural foraminal stenosis. Electronically Signed   By: Ulyses Jarred M.D.   On: 12/25/2022 20:53  PRN norco and flexeril. PT consult.  Ortho consult per am team it persistent.

## 2022-12-25 NOTE — Assessment & Plan Note (Signed)
Vitals:   12/25/22 1258 12/25/22 2029  BP: 130/83 126/80  Continue metoprolol.

## 2022-12-25 NOTE — Assessment & Plan Note (Signed)
IV PPI therapy.  

## 2022-12-25 NOTE — H&P (Signed)
History and Physical    Chief Complaint: LBP.   HISTORY OF PRESENT ILLNESS: Alex Brandt is an 47 y.o. male  seen for LBP. Duration: 1 day since yesterday when he felt a pop in his back and has not been able to get move around and has been laying flat with EMS the entire time. Frequency: constant. Location:LOWER BACK. Quality: sharp/ pressure.  Rate: 10/10. Radiation:NR. Aggravating: Movement.  Alleviating:rest.  Associated factors:N/A. Pt had an STEMI  in 05/2022 and had stent.      Pt has  Past Medical History:  Diagnosis Date   Angina at rest    Arthritis    Coronary artery disease 05/05/2021   a.) LHC 05/05/2021: EF 55-65%; normal LV function and LVEDP; 50% mLAD, 50% D1, 50% p-mRCA; med mgmt.   DJD (degenerative joint disease) of knee    Dyspnea    GERD (gastroesophageal reflux disease)    HLD (hyperlipidemia)    Hyperkalemia    Hypertension    Migraines    Valvular insufficiency 12/18/2014   a.) TTE 12/18/2014: EF >55%; mild PR, moderate MR/TR.     Review of Systems  Musculoskeletal:  Positive for back pain.  All other systems reviewed and are negative.  Allergies  Allergen Reactions   Shellfish Allergy Anaphylaxis and Swelling   Hydrocodone Nausea And Vomiting   Toradol [Ketorolac Tromethamine] Other (See Comments)    Patient states he cannot take it with one of his heart medications   Tramadol Other (See Comments)    Migraines   Tramadol Hcl Other (See Comments)   Past Surgical History:  Procedure Laterality Date   APPENDECTOMY     CARDIAC CATHETERIZATION     CHOLECYSTECTOMY     COLONOSCOPY     CORONARY/GRAFT ACUTE MI REVASCULARIZATION N/A 06/02/2022   Procedure: Coronary/Graft Acute MI Revascularization;  Surgeon: Yolonda Kida, MD;  Location: [facility];  Service: Cardiovascular;  Laterality: N/A;   ESOPHAGOGASTRODUODENOSCOPY     KNEE ARTHROSCOPY WITH MEDIAL MENISECTOMY Right 08/30/2021   Procedure: Right knee  arthroscopic partial medial meniscectomy;  Surgeon: Leim Fabry, MD;  Location: [facility];  Service: Orthopedics;  Laterality: Right;   KNEE SURGERY Right    meniscus repair in [place] and again at [place] Left 05/05/2021   Procedure: LEFT HEART CATH AND CORONARY ANGIOGRAPHY;  Surgeon: Yolonda Kida, MD;  Location: [facility];  Service: Cardiovascular;  Laterality: Left;   LEFT HEART CATH AND CORONARY ANGIOGRAPHY N/A 06/02/2022   Procedure: LEFT HEART CATH AND CORONARY ANGIOGRAPHY;  Surgeon: Yolonda Kida, MD;  Location: [place] [facility];  Service: Cardiovascular;  Laterality: N/A;   PARTIAL KNEE ARTHROPLASTY Right 02/15/2022   Procedure: UNICOMPARTMENTAL KNEE;  Surgeon: Corky Mull, MD;  Location: [facility];  Service: Orthopedics;  Laterality: Right;       MEDICATIONS: Current Outpatient Medications  Medication Instructions   aspirin 81 MG chewable tablet 1 tablet Orally Once a day   aspirin EC 81 mg, Daily   clopidogrel (PLAVIX) 75 mg, Oral, Daily   furosemide (LASIX) 20 mg, Oral, Daily PRN   isosorbide mononitrate (IMDUR) 30 mg, Oral, Daily   lidocaine (LIDODERM) 5 % 1 patch, Transdermal, Every 12 hours, Remove & Discard patch within 12 hours or as directed by MD   methocarbamol (ROBAXIN) 500 MG tablet 1-2 tablets every 6 hours prn muscle spasms   metoprolol succinate (TOPROL-XL) 12.5 mg, Oral, Daily  nicotine (NICODERM CQ - DOSED IN MG/24 HOURS) 21 mg, Transdermal, Daily   nicotine (NICODERM CQ) 21 mg/24hr patch Place onto the skin.   nitroGLYCERIN (NITROSTAT) 0.4 mg, Sublingual, Every 5 min PRN   omeprazole (PRILOSEC) 20 mg, BH-each morning   ondansetron (ZOFRAN-ODT) 4 mg, Oral, Every 6 hours PRN   oxyCODONE (OXY IR/ROXICODONE) 5 mg, Oral, Every 6 hours PRN   pantoprazole (PROTONIX) 40 mg, Oral, Daily   predniSONE (DELTASONE) 10 MG tablet Take 4 tablets once a day for 3 days starting tomorrow, 3 tablets once a day  for 3 days, 2 tablets for 3 days and then 1 tablet once a day for 3 days   rosuvastatin (CRESTOR) 40 mg, Oral, Daily at bedtime     [START ON 12/26/2022] albuterol  2.5 mg Nebulization Q6H   [START ON 12/26/2022] aspirin EC  81 mg Oral Daily   [START ON 12/26/2022] clopidogrel  75 mg Oral Daily   [START ON 12/26/2022] heparin  5,000 Units Subcutaneous Q8H   [START ON 12/26/2022] isosorbide mononitrate  30 mg Oral Daily   ketorolac  30 mg Intravenous Once   [START ON 12/26/2022] nicotine  21 mg Transdermal Daily   [START ON 12/26/2022] pantoprazole  40 mg Oral Daily   [START ON 12/26/2022] rosuvastatin  40 mg Oral QHS   [START ON 12/26/2022] sodium chloride flush  3 mL Intravenous Q12H   ED Course: Pt in Ed pt is alert/awake and orietned. No labs.  Vitals:   12/25/22 1258 12/25/22 2029  BP: 130/83 126/80  Pulse: 79 73  Resp: 18 19  Temp: 98 F (36.7 C)   SpO2: 99% 98%   No intake/output data recorded. SpO2: 98 % Blood work in ed shows: No results found for this or any previous visit (from the past 24 hour(s)).  Unresulted Labs (From admission, onward)     Start     Ordered   12/26/22 0500  CBC  Tomorrow morning,   STAT        12/25/22 2348   12/26/22 [id number]  Basic metabolic panel  Tomorrow morning,   STAT        12/25/22 2348   12/25/22 [id number]  Basic metabolic panel  ONCE - STAT,   R        12/25/22 2352   12/25/22 2347  CBC  (heparin)  Once,   STAT       Comments: Baseline for heparin therapy IF NOT ALREADY DRAWN.  Notify MD if PLT < 100 K.    12/25/22 2348   12/25/22 2347  Creatinine, serum  (heparin)  Once,   STAT       Comments: Baseline for heparin therapy IF NOT ALREADY DRAWN.    12/25/22 2348   12/25/22 2347  HIV Antibody (routine testing w rflx)  (HIV Antibody (Routine testing w reflex) panel)  Once,   URGENT        12/25/22 2348   12/25/22 2346  Urine Drug Screen, Qualitative ([place] only)  Add-on,   AD        12/25/22 2345           Pt has received : Orders Placed  This Encounter  Procedures   DG Thoracic Spine 2 View    Standing Status:   Standing    Number of Occurrences:   1    Order Specific Question:   Reason for Exam (SYMPTOM  OR DIAGNOSIS REQUIRED)    Answer:   pain   DG  Lumbar Spine 2-3 Views    Standing Status:   Standing    Number of Occurrences:   1    Order Specific Question:   Reason for Exam (SYMPTOM  OR DIAGNOSIS REQUIRED)    Answer:   pain   MR LUMBAR SPINE WO CONTRAST    Standing Status:   Standing    Number of Occurrences:   1    Order Specific Question:   What is the patient's sedation requirement?    Answer:   No Sedation    Order Specific Question:   Does the patient have a pacemaker or implanted devices?    Answer:   No   CT ABDOMEN PELVIS WO CONTRAST    Standing Status:   Standing    Number of Occurrences:   1    Order Specific Question:   Is Oral Contrast requested for this exam?    Answer:   Yes, Per Radiology protocol    Order Specific Question:   Does the patient have a contrast media/X-ray dye allergy?    Answer:   No    Order Specific Question:   Release to patient    Answer:   Immediate [1]   Urine Drug Screen, Qualitative ([place] only)    Standing Status:   Standing    Number of Occurrences:   1   CBC    Baseline for heparin therapy IF NOT ALREADY DRAWN.  Notify MD if PLT < 100 K.    Standing Status:   Standing    Number of Occurrences:   1   Creatinine, serum    Baseline for heparin therapy IF NOT ALREADY DRAWN.    Standing Status:   Standing    Number of Occurrences:   1   HIV Antibody (routine testing w rflx)    Standing Status:   Standing    Number of Occurrences:   1   CBC    Standing Status:   Standing    Number of Occurrences:   1   Basic metabolic panel    Standing Status:   Standing    Number of Occurrences:   1   Basic metabolic panel    Standing Status:   Standing    Number of Occurrences:   1   Diet Heart Room service appropriate? Yes; Fluid consistency: Thin    Standing Status:    Standing    Number of Occurrences:   1    Order Specific Question:   Room service appropriate?    Answer:   Yes    Order Specific Question:   Fluid consistency:    Answer:   Thin   Maintain IV access    Standing Status:   Standing    Number of Occurrences:   1   Vital signs    Standing Status:   Standing    Number of Occurrences:   1   Notify physician (specify)    Standing Status:   Standing    Number of Occurrences:   20    Order Specific Question:   Notify Physician    Answer:   for pulse less than 55 or greater than 120    Order Specific Question:   Notify Physician    Answer:   for respiratory rate less than 12 or greater than 25    Order Specific Question:   Notify Physician    Answer:   for temperature greater than 100.5 F    Order Specific Question:  Notify Physician    Answer:   for urinary output less than 30 mL/hr for four hours    Order Specific Question:   Notify Physician    Answer:   for systolic BP less than 90 or greater than 0000000, diastolic BP less than 60 or greater than 100    Order Specific Question:   Notify Physician    Answer:   for new hypoxia w/ oxygen saturations < 88%   Progressive Mobility Protocol: No Restrictions    Standing Status:   Standing    Number of Occurrences:   1   Daily weights    Standing Status:   Standing    Number of Occurrences:   1   Intake and Output    Standing Status:   Standing    Number of Occurrences:   1   Initiate Oral Care Protocol    Standing Status:   Standing    Number of Occurrences:   1   Initiate Carrier Fluid Protocol    Standing Status:   Standing    Number of Occurrences:   1   RN may order General Admission PRN Orders utilizing "General Admission PRN medications" (through manage orders) for the following patient needs: allergy symptoms (Claritin), cold sores (Carmex), cough (Robitussin DM), eye irritation (Liquifilm Tears), hemorrhoids (Tucks), indigestion (Maalox), minor skin irritation (Hydrocortisone  Cream), muscle pain ([place]), nose irritation (saline nasal spray) and sore throat (Chloraseptic spray).    Standing Status:   Standing    Number of Occurrences:   [phone]   Full code    Standing Status:   Standing    Number of Occurrences:   1    Order Specific Question:   By:    Answer:   Other   Consult to hospitalist    Standing Status:   Standing    Number of Occurrences:   1    Order Specific Question:   Place call to:    Answer:   [id number]    Order Specific Question:   Reason for Consult    Answer:   Admit    Order Specific Question:   Diagnosis/Clinical Info for Consult:    Answer:   Admission for Pain control. Back pain, bulging discs. Repeated rounds of pain meds with no improvement.   Pulse oximetry check with vital signs    Standing Status:   Standing    Number of Occurrences:   1   Oxygen therapy Mode or (Route): Nasal cannula; Liters Per Minute: 2; Keep 02 saturation: greater than 92 %    Standing Status:   Standing    Number of Occurrences:   20    Order Specific Question:   Mode or (Route)    Answer:   Nasal cannula    Order Specific Question:   Liters Per Minute    Answer:   2    Order Specific Question:   Keep 02 saturation    Answer:   greater than 92 %   Place in observation (patient's expected length of stay will be less than 2 midnights)    Standing Status:   Standing    Number of Occurrences:   1    Order Specific Question:   Hospital Area    Answer:   [place] [[id number]]    Order Specific Question:   Level of Care    Answer:   Med-Surg [16]    Order Specific Question:   Covid Evaluation  Answer:   Asymptomatic - no recent exposure (last 10 days) testing not required    Order Specific Question:   Diagnosis    Answer:   Low back pain [id number]    Order Specific Question:   Admitting Physician    Answer:   Cherylann Ratel    Order Specific Question:   Attending Physician    Answer:   Cherylann Ratel    Meds ordered  this encounter  Medications   morphine (PF) 4 MG/ML injection 4 mg   methocarbamol (ROBAXIN) tablet 1,000 mg   dexamethasone (DECADRON) injection 10 mg   morphine (PF) 4 MG/ML injection 4 mg   lidocaine (LIDODERM) 5 %    Sig: Place 1 patch onto the skin every 12 (twelve) hours. Remove & Discard patch within 12 hours or as directed by MD    Dispense:  10 patch    Refill:  0   methocarbamol (ROBAXIN) 500 MG tablet    Sig: 1-2 tablets every 6 hours prn muscle spasms    Dispense:  30 tablet    Refill:  0   predniSONE (DELTASONE) 10 MG tablet    Sig: Take 4 tablets once a day for 3 days starting tomorrow, 3 tablets once a day for 3 days, 2 tablets for 3 days and then 1 tablet once a day for 3 days    Dispense:  30 tablet    Refill:  0   oxyCODONE (OXY IR/ROXICODONE) 5 MG immediate release tablet    Sig: Take 1 tablet (5 mg total) by mouth every 6 (six) hours as needed for severe pain.    Dispense:  15 tablet    Refill:  0   HYDROmorphone (DILAUDID) injection 1 mg   HYDROmorphone (DILAUDID) injection 1 mg   ketorolac (TORADOL) 30 MG/ML injection 30 mg   methocarbamol (ROBAXIN) tablet 1,000 mg   cyclobenzaprine (FLEXERIL) tablet 5 mg   HYDROmorphone (DILAUDID) injection 0.5 mg   clopidogrel (PLAVIX) tablet 75 mg   isosorbide mononitrate (IMDUR) 24 hr tablet 30 mg   nicotine (NICODERM CQ - dosed in mg/24 hours) patch 21 mg   rosuvastatin (CRESTOR) tablet 40 mg   pantoprazole (PROTONIX) EC tablet 40 mg   nitroGLYCERIN (NITROSTAT) SL tablet 0.4 mg   aspirin EC tablet 81 mg    Swallow whole.     heparin injection 5,000 Units   sodium chloride flush (NS) 0.9 % injection 3 mL   0.9 %  sodium chloride infusion   OR Linked Order Group    acetaminophen (TYLENOL) tablet 650 mg    acetaminophen (TYLENOL) suppository 650 mg   albuterol (PROVENTIL) (2.5 MG/3ML) 0.083% nebulizer solution 2.5 mg     Admission Imaging : MR LUMBAR SPINE WO CONTRAST Result Date: 12/25/2022 CLINICAL DATA:  Low  back pain EXAM: MRI LUMBAR SPINE WITHOUT CONTRAST TECHNIQUE: Multiplanar, multisequence MR imaging of the lumbar spine was performed. No intravenous contrast was administered. COMPARISON:  None Available. FINDINGS: Segmentation:  Standard. Alignment:  Grade 1 retrolisthesis at L4-5 Vertebrae:  No fracture, evidence of discitis, or bone lesion. Conus medullaris and cauda equina: Conus extends to the L1 level. Conus and cauda equina appear normal. Paraspinal and other soft tissues: Negative. Disc levels: L1-L2: Normal disc space and facet joints. No spinal canal stenosis. No neural foraminal stenosis. L2-L3: Normal disc space and facet joints. No spinal canal stenosis. No neural foraminal stenosis. L3-L4: Small central disc protrusion, unchanged. No spinal canal stenosis. No neural foraminal  stenosis. L4-L5: Worsened small central disc protrusion. Narrowing of both lateral recesses without central spinal canal stenosis. No neural foraminal stenosis. L5-S1: Unchanged small central disc protrusion. No spinal canal stenosis. No neural foraminal stenosis. Visualized sacrum: Normal. IMPRESSION: 1. Worsened small central disc protrusion at L4-L5 with narrowing of both lateral recesses 2. Unchanged small central disc protrusions at L3-4 and L5-S1 without spinal canal or neural foraminal stenosis. Electronically Signed   By: Ulyses Jarred M.D.   On: 12/25/2022 20:53   DG Lumbar Spine 2-3 Views Result Date: 12/25/2022 CLINICAL DATA:  Pain EXAM: LUMBAR SPINE - 2-3 VIEW COMPARISON:  None Available. FINDINGS: There is no evidence of lumbar spine fracture. There is mild degenerative disc disease at L4-L5 and moderate degenerative disc disease at L5-S1. There is mild to moderate lower lumbar predominant facet arthropathy. Straightening of the lumbar lordosis. No significant listhesis. Mildly prominent gas distended small bowel and colon. IMPRESSION: No evidence of lumbar spine fracture. Mild to moderate degenerative changes at  L4-L5 and L5-S1. Mildly prominent, gas distended small bowel and colon. Electronically Signed   By: Maurine Simmering M.D.   On: 12/25/2022 15:46   DG Thoracic Spine 2 View Result Date: 12/25/2022 CLINICAL DATA:  Pain EXAM: THORACIC SPINE 2 VIEWS COMPARISON:  Chest CT 04/01/2017 FINDINGS: There is no evidence of thoracic spine fracture. Alignment is normal. There are minimal multilevel degenerative changes. IMPRESSION: No evidence of thoracic spine fracture. Minimal multilevel degenerative changes. Electronically Signed   By: Maurine Simmering M.D.   On: 12/25/2022 15:44      Physical Examination: Vitals:   12/25/22 1258 12/25/22 2029  BP: 130/83 126/80  Pulse: 79 73  Temp: 98 F (36.7 C)   Resp: 18 19  SpO2: 99% 98%   Physical Exam Vitals and nursing note reviewed.  Constitutional:      General: He is not in acute distress.    Appearance: He is not ill-appearing, toxic-appearing or diaphoretic.  HENT:     Head: Normocephalic and atraumatic.     Right Ear: Hearing and external ear normal.     Left Ear: Hearing and external ear normal.     Nose: Nose normal. No nasal deformity.     Mouth/Throat:     Lips: Pink.     Mouth: Mucous membranes are moist.     Tongue: No lesions.     Pharynx: Oropharynx is clear.  Eyes:     Extraocular Movements: Extraocular movements intact.  Cardiovascular:     Rate and Rhythm: Normal rate and regular rhythm.     Pulses: Normal pulses.     Heart sounds: Normal heart sounds.  Pulmonary:     Effort: Pulmonary effort is normal.     Breath sounds: Normal breath sounds.  Abdominal:     General: Bowel sounds are normal. There is no distension.     Palpations: Abdomen is soft. There is no mass.     Tenderness: There is no abdominal tenderness. There is no guarding.     Hernia: No hernia is present.  Musculoskeletal:     Right lower leg: No edema.     Left lower leg: No edema.  Skin:    General: Skin is warm.  Neurological:     General: No focal deficit  present.     Mental Status: He is alert and oriented to person, place, and time.     Cranial Nerves: Cranial nerves 2-12 are intact.     Motor: Motor function is intact.  Psychiatric:        Attention and Perception: Attention normal.        Mood and Affect: Mood normal.        Speech: Speech normal.        Behavior: Behavior normal. Behavior is cooperative.        Cognition and Memory: Cognition normal.       Assessment and Plan: * Low back pain Pt had LBP since yesterday and MRI today shows bulging disc. No alarm symptoms. Mri shows: IMPRESSION: 1. Worsened small central disc protrusion at L4-L5 with narrowing of both lateral recesses 2. Unchanged small central disc protrusions at L3-4 and L5-S1 without spinal canal or neural foraminal stenosis. Electronically Signed   By: Ulyses Jarred M.D.   On: 12/25/2022 20:53  PRN norco and flexeril. PT consult.  Ortho consult per am team it persistent.    STEMI involving right coronary artery (HCC) No chest pain. Cont aspirin 81 mg. Cont toprol XL 25 mg.  Cont rosuvastatin 40 mg.   Hypertension Vitals:   12/25/22 1258 12/25/22 2029  BP: 130/83 126/80  Continue metoprolol.     Nicotine dependence Nicotine patch.   GERD (gastroesophageal reflux disease) IV PPI therapy.     DVT prophylaxis:  Heparin.   Code Status:  Full code.    Family Communication:  Alex, Brandt (Spouse)  [phone]   Disposition Plan:  Home.   Consults called:  None.  Admission status: Observation.    Unit/ Expected LOS: Med surg./ 1 day.   Para Skeans MD [facility]  6 PM- 2 AM. Please contact me via secure Chat 6 PM-2 AM. [phone]( Pager ) To contact the [facility] Attending or Consulting provider [place] or covering provider during after hours [place], for this patient.   Check the care team in [facility] and look for a) attending/consulting [facility] provider listed and b) the [facility] team listed Log into [email or web address] and use [facility]'s universal password to access. If you do not have the password, please contact the hospital operator. Locate the [facility] provider you are looking for under [facility] and page to a number that you can be directly reached. If you still have difficulty reaching the provider, please page the [facility] (Director on Call) for the Hospitalists listed on amion for assistance. [email or web address] 12/25/2022, 11:52 PM

## 2022-12-25 NOTE — ED Provider Notes (Signed)
[facility] Provider Note    Event Date/Time   First MD Initiated Contact with Patient 12/25/22 1348     (approximate)   History   Back Pain   HPI  Alex Brandt is a 47 y.o. male   presents to the ED via EMS with complaint of back pain.  Patient states that he moved yesterday and felt a pop in his back and since that time has had severe pain.  He states he was able to get to the bathroom with a walker.  He reports he has had problems with his back in the past but has not seen anyone recently.      Physical Exam   Triage Vital Signs: ED Triage Vitals  Enc Vitals Group     BP 12/25/22 1258 130/83     Pulse Rate 12/25/22 1258 79     Resp 12/25/22 1258 18     Temp 12/25/22 1258 98 F (36.7 C)     Temp src --      SpO2 12/25/22 1258 99 %     Weight --      Height --      Head Circumference --      Peak Flow --      Pain Score 12/25/22 1253 10     Pain Loc --      Pain Edu? --      Excl. in [place]? --     Most recent vital signs: Vitals:   12/25/22 1258  BP: 130/83  Pulse: 79  Resp: 18  Temp: 98 F (36.7 C)  SpO2: 99%     General: Awake, no distress.  Patient currently is lying in a recliner where he was placed when EMS brought him to the ED.  He states he is unable to get out of the recliner without pain. CV:  Good peripheral perfusion.  Resp:  Normal effort.  Abd:  No distention.  Other:     ED Results / Procedures / Treatments   Labs (all labs ordered are listed, but only abnormal results are displayed) Labs Reviewed - No data to display    RADIOLOGY Lumbar x-ray images were reviewed by myself independent of the radiologist and no fractures or acute processes were noted for both the lumbar and thoracic spine.  Official lumbar x-rays show mild to moderate degenerative changes at L4-L5 and L5-S1.  Thoracic spine x-rays per radiologist minimal multilevel degenerative changes.   PROCEDURES:  Critical Care performed:    Procedures   MEDICATIONS ORDERED IN ED: Medications  morphine (PF) 4 MG/ML injection 4 mg (4 mg Intravenous Given 12/25/22 1406)  methocarbamol (ROBAXIN) tablet 1,000 mg (1,000 mg Oral Given 12/25/22 1601)  dexamethasone (DECADRON) injection 10 mg (10 mg Intravenous Given 12/25/22 1602)  morphine (PF) 4 MG/ML injection 4 mg (4 mg Intravenous Given 12/25/22 1601)     IMPRESSION / MDM / ASSESSMENT AND PLAN / ED COURSE  I reviewed the triage vital signs and the nursing notes.   Differential diagnosis includes, but is not limited to, muscle skeletal pain, lumbar compression fracture, degenerative changes, muscle skeletal strain.  47 year old male presents to the ED via EMS with complaint of back pain.  Patient states that yesterday he moved and felt a pop in his back and is continue to have pain since that time.  He has been ambulatory at his home with the use of a walker.  No prior back surgeries.  Patient was given morphine  4 mg IV prior to x-rays and was moved from the recliner that he was placed in by EMS onto a stretcher to be taken to the x-ray department.  Patient was made aware that his x-rays did not show any acute changes but does show degenerative changes.  We discussed follow-up with orthopedics for his back.  Patient continues to have pain while in the emergency department but no symptoms suggestive of cauda equina.  An injection of Decadron 10 mg and morphine 4 mg IV was given to the patient along with a dose of methocarbamol 1000 mg p.o.  Patient is encouraged to follow-up with Dr. Earnestine Leys who is on-call for orthopedics who may also refer him to pain management.      Patient's presentation is most consistent with acute complicated illness / injury requiring diagnostic workup.  FINAL CLINICAL IMPRESSION(S) / ED DIAGNOSES   Final diagnoses:  Lumbar back pain  Acute exacerbation of chronic low back pain     Rx / DC Orders   ED Discharge Orders          Ordered     lidocaine (LIDODERM) 5 %  Every 12 hours        12/25/22 1615    methocarbamol (ROBAXIN) 500 MG tablet        12/25/22 1615    predniSONE (DELTASONE) 10 MG tablet        12/25/22 1615    oxyCODONE (OXY IR/ROXICODONE) 5 MG immediate release tablet  Every 6 hours PRN        12/25/22 1615             Note:  This document was prepared using Dragon voice recognition software and may include unintentional dictation errors.   Johnn Hai, PA-C 12/25/22 1626    Blake Divine, MD 12/25/22 [phone]

## 2022-12-25 NOTE — Discharge Instructions (Signed)
Call make an appoint with Dr. Earnestine Leys who is on-call for orthopedics for further evaluation and treatment of your back.  Prescriptions were sent to your pharmacy which included a Lidoderm patch to apply to your back, continued muscle relaxants, pain medication and steroids.  You had a dose of steroids while you are in the emergency department and steroid tablets can be started tomorrow morning.  You may use ice or heat to your back as needed for discomfort.  Return to the emergency department if you develop any complete loss of control to your bowel or bladder.

## 2022-12-26 DIAGNOSIS — M5136 Other intervertebral disc degeneration, lumbar region: Secondary | ICD-10-CM | POA: Diagnosis not present

## 2022-12-26 LAB — BASIC METABOLIC PANEL
Anion gap: 7 (ref 5–15)
Anion gap: 8 (ref 5–15)
BUN: 17 mg/dL (ref 6–20)
BUN: 17 mg/dL (ref 6–20)
CO2: 21 mmol/L — ABNORMAL LOW (ref 22–32)
CO2: 22 mmol/L (ref 22–32)
Calcium: 8.6 mg/dL — ABNORMAL LOW (ref 8.9–10.3)
Calcium: 8.6 mg/dL — ABNORMAL LOW (ref 8.9–10.3)
Chloride: 105 mmol/L (ref 98–111)
Chloride: 104 mmol/L (ref 98–111)
Creatinine, Ser: 1.19 mg/dL (ref 0.61–1.24)
Creatinine, Ser: 1.15 mg/dL (ref 0.61–1.24)
GFR, Estimated: >60 mL/min (ref >60)
GFR, Estimated: >60 mL/min (ref >60)
Glucose, Bld: 157 mg/dL — ABNORMAL HIGH (ref 70–99)
Glucose, Bld: 127 mg/dL — ABNORMAL HIGH (ref 70–99)
Potassium: 4.2 mmol/L (ref 3.5–5.1)
Potassium: 4.4 mmol/L (ref 3.5–5.1)
Sodium: 133 mmol/L — ABNORMAL LOW (ref 135–145)
Sodium: 134 mmol/L — ABNORMAL LOW (ref 135–145)

## 2022-12-26 LAB — CBC
HCT: 48.4 % (ref 39.0–52.0)
HCT: 49 % (ref 39.0–52.0)
Hemoglobin: 16.2 g/dL (ref 13.0–17.0)
Hemoglobin: 16.4 g/dL (ref 13.0–17.0)
MCH: 29 pg (ref 26.0–34.0)
MCH: 28.9 pg (ref 26.0–34.0)
MCHC: 33.5 g/dL (ref 30.0–36.0)
MCHC: 33.5 g/dL (ref 30.0–36.0)
MCV: 86.7 fL (ref 80.0–100.0)
MCV: 86.4 fL (ref 80.0–100.0)
Platelets: 218 10*3/uL (ref 150–400)
Platelets: 232 10*3/uL (ref 150–400)
RBC: 5.58 MIL/uL (ref 4.22–5.81)
RBC: 5.67 MIL/uL (ref 4.22–5.81)
RDW: 13.7 % (ref 11.5–15.5)
RDW: 13.7 % (ref 11.5–15.5)
WBC: 8.6 10*3/uL (ref 4.0–10.5)
WBC: 9 10*3/uL (ref 4.0–10.5)
nRBC: 0 % (ref 0.0–0.2)
nRBC: 0 % (ref 0.0–0.2)

## 2022-12-26 LAB — URINE DRUG SCREEN, QUALITATIVE (ARMC ONLY)
Amphetamines, Ur Screen: NOT DETECTED
Barbiturates, Ur Screen: NOT DETECTED
Benzodiazepine, Ur Scrn: NOT DETECTED
Cannabinoid 50 Ng, Ur ~~LOC~~: NOT DETECTED
Cocaine Metabolite,Ur ~~LOC~~: NOT DETECTED
MDMA (Ecstasy)Ur Screen: NOT DETECTED
Methadone Scn, Ur: NOT DETECTED
Opiate, Ur Screen: POSITIVE — AB
Phencyclidine (PCP) Ur S: NOT DETECTED
Tricyclic, Ur Screen: NOT DETECTED

## 2022-12-26 LAB — HIV ANTIBODY (ROUTINE TESTING W REFLEX): HIV Screen 4th Generation wRfx: NONREACTIVE

## 2022-12-26 MED ORDER — HYDROMORPHONE HCL 1 MG/ML IJ SOLN
1.0000 mg | INTRAMUSCULAR | Status: DC | PRN
  Administered 2022-12-26 (×2): 1 mg via INTRAVENOUS
  Filled 2022-12-26 (×2): qty 1

## 2022-12-26 MED ORDER — ALBUTEROL SULFATE (2.5 MG/3ML) 0.083% IN NEBU: 2.5000 mg | INHALATION_SOLUTION | Freq: Four times a day (QID) | RESPIRATORY_TRACT | Status: DC | PRN

## 2022-12-26 NOTE — ED Notes (Signed)
Patient stated he was having more pain. Gave patient tylenol. No other needs at this time.

## 2022-12-26 NOTE — Evaluation (Signed)
Physical Therapy Evaluation Patient Details Name: Alex Brandt MRN: [id number] DOB: [birth year] Today's Date: 12/26/2022  History of Present Illness  Pt is a 47 y/o M admitted on 12/25/22 after presenting with c/o back pain & decreased ability to mobilize after feeling a pop in his back. MRI shows a bulging disc. PMH: angina at rest, arthritis, CAD, knee DJD, dyspnea, GERD, HLD, HTN, migraines  Clinical Impression  Pt seen for PT evaluation with pt agreeable. Pt reports prior to admission he was independent without AD. On this date, PT educates pt on log rolling with pt able to complete bed mobility with HOB slightly elevated, bed rails, and significantly extra time to come to sitting EOB. Pt is able to transfer STS from elevated EOB in modified way & take a few steps to L along EOB with RW. Anticipate once pt's pain is better managed, pt will be able to mobilize better.   Recommendations for follow up therapy are one component of a multi-disciplinary discharge planning process, led by the attending physician.  Recommendations may be updated based on patient status, additional functional criteria and insurance authorization.  Follow Up Recommendations Outpatient PT Can patient physically be transported by private vehicle: Yes    Assistance Recommended at Discharge PRN  Patient can return home with the following  Assistance with cooking/housework;Help with stairs or ramp for entrance;Assist for transportation;A little help with walking and/or transfers;A little help with bathing/dressing/bathroom    Equipment Recommendations None recommended by PT (pt reports he has a RW & BSC)  Recommendations for Other Services       Functional Status Assessment Patient has had a recent decline in their functional status and demonstrates the ability to make significant improvements in function in a reasonable and predictable amount of time.     Precautions / Restrictions Precautions Precautions:  Fall;Back (back precautions for comfort) Restrictions Weight Bearing Restrictions: No      Mobility  Bed Mobility Overal bed mobility: Needs Assistance Bed Mobility: Rolling, Supine to Sit, Sit to Supine Rolling: Supervision   Supine to sit: Supervision, HOB elevated Sit to supine: Supervision, HOB elevated   General bed mobility comments: Education re: log rolling to increase comfort.    Transfers Overall transfer level: Needs assistance Equipment used: Rolling walker (2 wheels) Transfers: Sit to/from Stand Sit to Stand: Supervision           General transfer comment: Pt is able to transfer STS from elevated EOB with extra time & pt walking hands forwards, pt on anterior portion of BLE feet with BLE hips/knees flexed during transfer.    Ambulation/Gait Ambulation/Gait assistance:  (2)       Gait velocity: decreased     General Gait Details: Pt is able to take 2 steps to L along EOB with RW & sueprvision.  Stairs            Wheelchair Mobility    Modified Rankin (Stroke Patients Only)       Balance Overall balance assessment: Needs assistance Sitting-balance support: Feet supported Sitting balance-Leahy Scale: Good     Standing balance support: During functional activity, Bilateral upper extremity supported Standing balance-Leahy Scale: Good                               Pertinent Vitals/Pain Pain Assessment Pain Assessment: Faces Faces Pain Scale: Hurts whole lot Pain Location: low back Pain Descriptors / Indicators: Discomfort Pain Intervention(s): Monitored during session,  Repositioned, Limited activity within patient's tolerance    Home Living Family/patient expects to be discharged to:: Private residence Living Arrangements: Spouse/significant other;Children Available Help at Discharge: Family Type of Home: House Home Access: Stairs to enter Entrance Stairs-Rails: Right;Left (wideset) Entrance Stairs-Number of Steps:  3   Home Layout: Two level;Able to live on main level with bedroom/bathroom Home Equipment: Rolling Walker (2 wheels);BSC/3in1      Prior Function Prior Level of Function : Independent/Modified Independent             Mobility Comments: Pt reports he was independent without AD, wasn't driving 2/2 knee issues, hasn't worked for ~4 years.       Hand Dominance        Extremity/Trunk Assessment        Lower Extremity Assessment Lower Extremity Assessment: Overall WFL for tasks assessed       Communication   Communication: No difficulties  Cognition Arousal/Alertness: Awake/alert Behavior During Therapy: WFL for tasks assessed/performed Overall Cognitive Status: Within Functional Limits for tasks assessed                                          General Comments      Exercises     Assessment/Plan    PT Assessment Patient needs continued PT services  PT Problem List Pain;Decreased mobility;Decreased activity tolerance;Decreased knowledge of precautions;Decreased knowledge of use of DME       PT Treatment Interventions DME instruction;Therapeutic exercise;Gait training;Balance training;Neuromuscular re-education;Stair training;Functional mobility training;Therapeutic activities;Patient/family education    PT Goals (Current goals can be found in the Care Plan section)  Acute Rehab PT Goals Patient Stated Goal: decreased pain PT Goal Formulation: With patient Time For Goal Achievement: 01/09/23 Potential to Achieve Goals: Good    Frequency Min 2X/week     Co-evaluation               AM-PAC PT "6 Clicks" Mobility  Outcome Measure Help needed turning from your back to your side while in a flat bed without using bedrails?: None Help needed moving from lying on your back to sitting on the side of a flat bed without using bedrails?: A Little Help needed moving to and from a bed to a chair (including a wheelchair)?: A Little Help needed  standing up from a chair using your arms (e.g., wheelchair or bedside chair)?: A Little Help needed to walk in hospital room?: A Little Help needed climbing 3-5 steps with a railing? : A Lot 6 Click Score: 18    End of Session   Activity Tolerance: Patient limited by pain Patient left: in bed;with call bell/phone within reach   PT Visit Diagnosis: Pain;Other abnormalities of gait and mobility (R26.89) Pain - part of body:  (back)    Time: [id number] PT Time Calculation (min) (ACUTE ONLY): 16 min   Charges:   PT Evaluation $PT Eval Moderate Complexity: [place], PT, DPT 12/26/22, 1:20 PM   Waunita Schooner 12/26/2022, 1:18 PM

## 2022-12-26 NOTE — Progress Notes (Signed)
Progress Note   Patient: Alex Brandt [id number] DOB: Apr 15, 1976 DOA: 12/25/2022     0 DOS: the patient was seen and examined on 12/26/2022   Subjective:  Patient seen and examined at bedside in the presence of the wife According to him he still has pain in the low back making it difficult to move the legs He denied bowel and bladder dysfunction There is no sensory abnormality detected.     Brief hospital course: MCDANIEL STONEBURG is an 47 y.o. male  seen for LBP. MRI of the lumbar spine showing worsening small central disc protrusion at L4-L5 with narrowing of both lateral recesses.  Patient was subsequently admitted for pain management. I personally discussed the case with neurosurgeon who is planning to see patient as an outpatient upon discharge.  Assessment and Plan:   Low back pain secondary to disc bulge No alarm symptoms at this time. Mri shows: IMPRESSION: 1. Worsened small central disc protrusion at L4-L5 with narrowing of both lateral recesses 2. Unchanged small central disc protrusions at L3-4 and L5-S1 without spinal canal or neural foraminal stenosis. Electronically Signed   By: Ulyses Jarred M.D.   On: 12/25/2022 20:53  Given significant pain patient's medication has been switched to Dilaudid Continue Flexeril Physical therapy is on board and we appreciate input I spoke with orthopedic surgeon who recommended I discussed with neurosurgeon. I personally discussed the case with neurosurgeon who recommended they will see patient as an outpatient     STEMI involving right coronary artery ([place]) No chest pain. Cont aspirin 81 mg. Cont toprol XL 25 mg.  Cont rosuvastatin 40 mg.     Hypertension Continue metoprolol     Nicotine dependence Nicotine patch.    GERD (gastroesophageal reflux disease) IV PPI therapy.      DVT prophylaxis:  Heparin.   Code Status:  Full code.    Family Communication:  Jovahn, Wohlwend (Spouse)  [phone]    Disposition Plan:  Home.   Consults called:  None.    Physical Exam: Constitutional:      General: He is not in acute distress. Eyes:     Extraocular Movements: Extraocular movements intact.  Cardiovascular:     Rate and Rhythm: Normal rate and regular rhythm.  Pulmonary:     Effort: Pulmonary effort is normal.  Abdominal:     General: Bowel sounds are normal. There is no distension.  Musculoskeletal:     Right lower leg: No edema.     Left lower leg: No edema.  Skin:    General: Skin is warm.  Neurological:     General: No focal deficit present.     He has tenderness of the lower back upon moving bilateral lower extremities Psychiatric:        Attention and Perception: Attention normal.        Vitals:   12/26/22 1356 12/26/22 1357 12/26/22 1404 12/26/22 1718  BP: 115/73   124/68  Pulse: 71 73 89 79  Resp: 16   17  Temp:    98.5 F (36.9 C)  TempSrc:      SpO2: 98% 98% 98% 98%  Height:        Data Reviewed: I personally reviewed the patient's MRI results showing disc bulge at L4-L5  Family Communication: Discussed with patient's wife present at bedside  Disposition: Status is: Observation The patient remains OBS appropriate and will d/c before 2 midnights.  Planned Discharge Destination: Home  Time spent: 40 minutes  Author: Alfonse Spruce  Stephania Fragmin, MD 12/26/2022 5:58 PM  For on call review [email or web address].

## 2022-12-26 NOTE — ED Notes (Signed)
Pt ambulatory with walker to bathroom

## 2022-12-26 NOTE — Progress Notes (Signed)
CSW spoke with patient regarding PT recommendations of outpatient PT. Patient reports he declines at this time, states he is feeling better and believes he will be okay without it.   No further discharge needs identified.   Kelby Fam, [place], MSW, [place]

## 2022-12-27 DIAGNOSIS — M5136 Other intervertebral disc degeneration, lumbar region: Secondary | ICD-10-CM | POA: Diagnosis not present

## 2022-12-27 MED ORDER — ACETAMINOPHEN 325 MG PO TABS: 650.0000 mg | ORAL_TABLET | Freq: Four times a day (QID) | ORAL | 1 refills | Status: AC | PRN

## 2022-12-27 MED ORDER — CYCLOBENZAPRINE HCL 5 MG PO TABS: 5.0000 mg | ORAL_TABLET | Freq: Three times a day (TID) | ORAL | 0 refills | Status: DC | PRN

## 2022-12-27 NOTE — Progress Notes (Signed)
Derry Skill to be D/C'd Home per MD order.  Discussed prescriptions and follow up appointments with the patient. Prescriptions given to patient, medication list explained in detail. Pt verbalized understanding.  Allergies as of 12/27/2022       Reactions   Shellfish Allergy Anaphylaxis, Swelling   Hydrocodone Nausea And Vomiting   Toradol [ketorolac Tromethamine] Other (See Comments)   Patient states he cannot take it with one of his heart medications   Tramadol Other (See Comments)   Migraines   Tramadol Hcl Other (See Comments)        Medication List     STOP taking these medications    aspirin 81 MG chewable tablet   aspirin EC 81 MG tablet   metoprolol succinate 25 MG 24 hr tablet Commonly known as: TOPROL-XL   omeprazole 20 MG capsule Commonly known as: PRILOSEC   ondansetron 4 MG disintegrating tablet Commonly known as: ZOFRAN-ODT       TAKE these medications    acetaminophen 325 MG tablet Commonly known as: TYLENOL Take 2 tablets (650 mg total) by mouth every 6 (six) hours as needed for mild pain, fever or headache (or Fever >/= 101).   clopidogrel 75 MG tablet Commonly known as: PLAVIX Take 75 mg by mouth daily.   cyclobenzaprine 5 MG tablet Commonly known as: FLEXERIL Take 1 tablet (5 mg total) by mouth 3 (three) times daily as needed for muscle spasms.   furosemide 20 MG tablet Commonly known as: LASIX Take 20 mg by mouth daily as needed for fluid.   isosorbide mononitrate 30 MG 24 hr tablet Commonly known as: IMDUR Take 30 mg by mouth daily.   lidocaine 5 % Commonly known as: Lidoderm Place 1 patch onto the skin every 12 (twelve) hours. Remove & Discard patch within 12 hours or as directed by MD   methocarbamol 500 MG tablet Commonly known as: ROBAXIN 1-2 tablets every 6 hours prn muscle spasms   nicotine 21 mg/24hr patch Commonly known as: NICODERM CQ - dosed in mg/24 hours Place 1 patch (21 mg total) onto the skin daily. What  changed: Another medication with the same name was removed. Continue taking this medication, and follow the directions you see here.   nitroGLYCERIN 0.4 MG SL tablet Commonly known as: NITROSTAT Place 0.4 mg under the tongue every 5 (five) minutes as needed for chest pain.   oxyCODONE 5 MG immediate release tablet Commonly known as: Oxy IR/ROXICODONE Take 1 tablet (5 mg total) by mouth every 6 (six) hours as needed for severe pain.   pantoprazole 40 MG tablet Commonly known as: PROTONIX Take 40 mg by mouth daily.   predniSONE 10 MG tablet Commonly known as: DELTASONE Take 4 tablets once a day for 3 days starting tomorrow, 3 tablets once a day for 3 days, 2 tablets for 3 days and then 1 tablet once a day for 3 days   rosuvastatin 40 MG tablet Commonly known as: CRESTOR Take 40 mg by mouth at bedtime.        Vitals:   12/27/22 0504 12/27/22 0830  BP: 103/68 107/77  Pulse: 61 71  Resp:  16  Temp:  98 F (36.7 C)  SpO2:  100%    Skin clean, dry and intact without evidence of skin break down, no evidence of skin tears noted. IV catheter discontinued intact. Site without signs and symptoms of complications. Dressing and pressure applied. Pt denies pain at this time. No complaints noted.  An After  Visit Summary was printed and given to the patient. Patient escorted via [place], and D/C home via private auto.  Alex Brandt

## 2022-12-27 NOTE — Discharge Summary (Signed)
Physician Discharge Summary   Patient: Alex Brandt MRN: [id number] DOB: 1976/02/09  Admit date:     12/25/2022  Discharge date: 12/27/22  Discharge Physician: Verline Lema   PCP: [facility]    Discharge Diagnoses: Low back pain secondary to lumbar disc bulge History of STEMI involving right coronary artery [facility]) that is post PCI Hypertension Nicotine dependence GERD (gastroesophageal reflux disease)  Hospital Course: Alex Brandt is an 47 y.o. male  seen for LBP. MRI of the lumbar spine showing worsening small central disc protrusion at L4-L5 with narrowing of both lateral recesses.  Patient was subsequently admitted for pain management. I personally discussed the case with neurosurgeon who is planning to see patient as an outpatient upon discharge.  Metoprolol and aspirin were initiated however according to patient these have been discontinued by his cardiologist.  Plan of care was also discussed with orthopedics as well as neurosurgery team and they recommended no acute surgical intervention at this time.  They recommended patient to follow-up with them as an outpatient.  PT saw patient and recommended outpatient PT however patient declined.   Consultants: Neurovascular surgery Procedures performed: None Disposition: Home Diet recommendation:  Discharge Diet Orders (From admission, onward)     Start     Ordered   12/27/22 0000  Diet - low sodium heart healthy        12/27/22 1127   12/26/22 0000  Diet - low sodium heart healthy        12/26/22 1219           Cardiac diet DISCHARGE MEDICATION: Allergies as of 12/27/2022       Reactions   Shellfish Allergy Anaphylaxis, Swelling   Hydrocodone Nausea And Vomiting   Toradol [ketorolac Tromethamine] Other (See Comments)   Patient states he cannot take it with one of his heart medications   Tramadol Other (See Comments)   Migraines   Tramadol Hcl Other (See Comments)         Medication List     STOP taking these medications    aspirin 81 MG chewable tablet   aspirin EC 81 MG tablet   metoprolol succinate 25 MG 24 hr tablet Commonly known as: TOPROL-XL   omeprazole 20 MG capsule Commonly known as: PRILOSEC   ondansetron 4 MG disintegrating tablet Commonly known as: ZOFRAN-ODT       TAKE these medications    acetaminophen 325 MG tablet Commonly known as: TYLENOL Take 2 tablets (650 mg total) by mouth every 6 (six) hours as needed for mild pain, fever or headache (or Fever >/= 101).   clopidogrel 75 MG tablet Commonly known as: PLAVIX Take 75 mg by mouth daily.   cyclobenzaprine 5 MG tablet Commonly known as: FLEXERIL Take 1 tablet (5 mg total) by mouth 3 (three) times daily as needed for muscle spasms.   furosemide 20 MG tablet Commonly known as: LASIX Take 20 mg by mouth daily as needed for fluid.   isosorbide mononitrate 30 MG 24 hr tablet Commonly known as: IMDUR Take 30 mg by mouth daily.   lidocaine 5 % Commonly known as: Lidoderm Place 1 patch onto the skin every 12 (twelve) hours. Remove & Discard patch within 12 hours or as directed by MD   methocarbamol 500 MG tablet Commonly known as: ROBAXIN 1-2 tablets every 6 hours prn muscle spasms   nicotine 21 mg/24hr patch Commonly known as: NICODERM CQ - dosed in mg/24 hours Place 1 patch (  21 mg total) onto the skin daily. What changed: Another medication with the same name was removed. Continue taking this medication, and follow the directions you see here.   nitroGLYCERIN 0.4 MG SL tablet Commonly known as: NITROSTAT Place 0.4 mg under the tongue every 5 (five) minutes as needed for chest pain.   oxyCODONE 5 MG immediate release tablet Commonly known as: Oxy IR/ROXICODONE Take 1 tablet (5 mg total) by mouth every 6 (six) hours as needed for severe pain.   pantoprazole 40 MG tablet Commonly known as: PROTONIX Take 40 mg by mouth daily.   predniSONE 10 MG  tablet Commonly known as: DELTASONE Take 4 tablets once a day for 3 days starting tomorrow, 3 tablets once a day for 3 days, 2 tablets for 3 days and then 1 tablet once a day for 3 days   rosuvastatin 40 MG tablet Commonly known as: CRESTOR Take 40 mg by mouth at bedtime.        Follow-up Information     Earnestine Leys, MD. Schedule an appointment as soon as possible for a visit .   Specialty: Orthopedic Surgery Why: Call Monday to get an appointment. Contact information: [place] [address] [phone]                Discharge Exam: Danley Danker Weights   12/27/22 0002 12/27/22 0400  Weight: 91.8 kg 91.8 kg   Physical Exam: Constitutional:      General: He is not in acute distress. Eyes:     Extraocular Movements: Extraocular movements intact.  Cardiovascular:     Rate and Rhythm: Normal rate and regular rhythm.  Pulmonary:     Effort: Pulmonary effort is normal.  Abdominal:     General: Bowel sounds are normal. There is no distension.  Musculoskeletal:     Right lower leg: No edema.     Left lower leg: No edema.  Skin:    General: Skin is warm.  Neurological:     General: No focal deficit present.  No sensory deficits Able to move bilateral lower extremities Psychiatric:        Attention and Perception: Attention normal.      Condition at discharge: good Discharge time spent: greater than 30 minutes.  Signed: Verline Lema, MD [facility] 12/27/2022

## 2023-01-03 NOTE — Progress Notes (Unsigned)
Referring Physician:  Verline Lema, MD [address], [place],  [place] [address]  Primary Physician:  [facility]  History of Present Illness: 01/05/2023 Alex Brandt has a history of CAD, GERD, hyperlipidemia, HTN, migraines, and valvular insufficiency.   Seen in ED on 12/25/22 with LBP after feeling a pop and severe pain in his back while moving on 12/24/22. He was admitted to medicine service for pain management.   He has constant LBP with bilateral leg pain (entire leg) to his feet. He has intermittent numbness/tingling in legs. Pain is worse with walking, standing, bending, lifting. Some improvement with flexeril and laying flat. No weakness.   History of right knee surgery x 3.   He is on PLAVIX. He was discharged on flexeril, lidoderm patches, robaxin, oxycodone, and prednisone.   Bowel/Bladder Dysfunction: none  He smokes 1 ppd x 28 years.   Conservative measures:  Physical therapy: has not participated in  Multimodal medical therapy including regular antiinflammatories: tylenol, flexeril, lidocaine patch, robaxin, oxycodone, prednisone Injections: has had epidural steroid injections in the past with no relief  Past Surgery: no spinal surgery  Alex Brandt has no symptoms of cervical myelopathy.  The symptoms are causing a significant impact on the patient's life.   Review of Systems:  A 10 point review of systems is negative, except for the pertinent positives and negatives detailed in the HPI.  Past Medical History: Past Medical History:  Diagnosis Date   Angina at rest    Arthritis    Coronary artery disease 05/05/2021   a.) LHC 05/05/2021: EF 55-65%; normal LV function and LVEDP; 50% mLAD, 50% D1, 50% p-mRCA; med mgmt.   DJD (degenerative joint disease) of knee    Dyspnea    GERD (gastroesophageal reflux disease)    HLD (hyperlipidemia)    Hyperkalemia    Hypertension    Migraines    Valvular  insufficiency 12/18/2014   a.) TTE 12/18/2014: EF >55%; mild PR, moderate MR/TR.    Past Surgical History: Past Surgical History:  Procedure Laterality Date   APPENDECTOMY     CARDIAC CATHETERIZATION     CHOLECYSTECTOMY     COLONOSCOPY     CORONARY/GRAFT ACUTE MI REVASCULARIZATION N/A 06/02/2022   Procedure: Coronary/Graft Acute MI Revascularization;  Surgeon: Yolonda Kida, MD;  Location: [facility];  Service: Cardiovascular;  Laterality: N/A;   ESOPHAGOGASTRODUODENOSCOPY     KNEE ARTHROSCOPY WITH MEDIAL MENISECTOMY Right 08/30/2021   Procedure: Right knee arthroscopic partial medial meniscectomy;  Surgeon: Leim Fabry, MD;  Location: [facility];  Service: Orthopedics;  Laterality: Right;   KNEE SURGERY Right    meniscus repair in [place] and again at [facility] Left 05/05/2021   Procedure: LEFT HEART CATH AND CORONARY ANGIOGRAPHY;  Surgeon: Yolonda Kida, MD;  Location: [facility];  Service: Cardiovascular;  Laterality: Left;   LEFT HEART CATH AND CORONARY ANGIOGRAPHY N/A 06/02/2022   Procedure: LEFT HEART CATH AND CORONARY ANGIOGRAPHY;  Surgeon: Yolonda Kida, MD;  Location: [facility];  Service: Cardiovascular;  Laterality: N/A;   PARTIAL KNEE ARTHROPLASTY Right 02/15/2022   Procedure: UNICOMPARTMENTAL KNEE;  Surgeon: Corky Mull, MD;  Location: [facility];  Service: Orthopedics;  Laterality: Right;    Allergies: Allergies as of 01/05/2023 - Review Complete 01/05/2023  Allergen Reaction Noted   Shellfish allergy Anaphylaxis and Swelling 06/06/2015   Hydrocodone Nausea  And Vomiting 06/27/2020   Toradol [ketorolac tromethamine] Other (See Comments) 12/25/2022   Tramadol Other (See Comments) 04/06/2017   Tramadol hcl Other (See Comments) 06/20/2022    Medications: Outpatient Encounter Medications as of 01/05/2023  Medication Sig   acetaminophen (TYLENOL) 325 MG tablet Take 2 tablets (650 mg total)  by mouth every 6 (six) hours as needed for mild pain, fever or headache (or Fever >/= 101).   clopidogrel (PLAVIX) 75 MG tablet Take 75 mg by mouth daily.   cyclobenzaprine (FLEXERIL) 5 MG tablet Take 1 tablet (5 mg total) by mouth 3 (three) times daily as needed for muscle spasms.   furosemide (LASIX) 20 MG tablet Take 20 mg by mouth daily as needed for fluid.   isosorbide mononitrate (IMDUR) 30 MG 24 hr tablet Take 30 mg by mouth daily.   nitroGLYCERIN (NITROSTAT) 0.4 MG SL tablet Place 0.4 mg under the tongue every 5 (five) minutes as needed for chest pain.   pantoprazole (PROTONIX) 40 MG tablet Take 40 mg by mouth daily.   predniSONE (DELTASONE) 10 MG tablet Take 4 tablets once a day for 3 days starting tomorrow, 3 tablets once a day for 3 days, 2 tablets for 3 days and then 1 tablet once a day for 3 days   rosuvastatin (CRESTOR) 40 MG tablet Take 40 mg by mouth at bedtime.   [DISCONTINUED] lidocaine (LIDODERM) 5 % Place 1 patch onto the skin every 12 (twelve) hours. Remove & Discard patch within 12 hours or as directed by MD (Patient not taking: Reported on 01/05/2023)   [DISCONTINUED] methocarbamol (ROBAXIN) 500 MG tablet 1-2 tablets every 6 hours prn muscle spasms (Patient not taking: Reported on 01/05/2023)   [DISCONTINUED] nicotine (NICODERM CQ - DOSED IN MG/24 HOURS) 21 mg/24hr patch Place 1 patch (21 mg total) onto the skin daily. (Patient not taking: Reported on 01/05/2023)   [DISCONTINUED] oxyCODONE (OXY IR/ROXICODONE) 5 MG immediate release tablet Take 1 tablet (5 mg total) by mouth every 6 (six) hours as needed for severe pain. (Patient not taking: Reported on 01/05/2023)   No facility-administered encounter medications on file as of 01/05/2023.    Social History: Social History   Tobacco Use   Smoking status: Every Day    Packs/day: 0.50    Years: 20.00    Additional pack years: 0.00    Total pack years: 10.00    Types: Cigarettes   Smokeless tobacco: Never  Vaping Use    Vaping Use: Never used  Substance Use Topics   Alcohol use: Not Currently   Drug use: Never    Family Medical History: History reviewed. No pertinent family history.  Physical Examination: Vitals:   01/05/23 0855  BP: 134/84    General: Patient is well developed, well nourished, calm, collected, and in no apparent distress. Attention to examination is appropriate.  Respiratory: Patient is breathing without any difficulty.   NEUROLOGICAL:     Awake, alert, oriented to person, place, and time.  Speech is clear and fluent. Fund of knowledge is appropriate.   Cranial Nerves: Pupils equal round and reactive to light.  Facial tone is symmetric.    Limited ROM of lumbar spine with pain Mild right sided posterior lumbar tenderness.   No abnormal lesions on exposed skin.   Strength: Side Biceps Triceps Deltoid Interossei Grip Wrist Ext. Wrist Flex.  R 5 5 5 5 5 5 5   L 5 5 5 5 5 5 5    Side Iliopsoas Quads Hamstring PF DF EHL  R 5 5 5 5 5 5   L 5 5 5 5 5 5    Reflexes are 2+ and symmetric at the biceps, triceps, brachioradialis, and achilles.  Reflex 2+ left patella, not tested on right at his request (he has knee pain).  Hoffman's is positive on left, negative on right.  Clonus is not present.   Bilateral upper and lower extremity sensation is intact to light touch, but diminished right lateral knee.   He has limping gait favoring right leg/knee.   Medical Decision Making  Imaging: Lumbar spine xrays dated 12/25/22: (not standing) FINDINGS: There is no evidence of lumbar spine fracture. There is mild degenerative disc disease at L4-L5 and moderate degenerative disc disease at L5-S1. There is mild to moderate lower lumbar predominant facet arthropathy. Straightening of the lumbar lordosis. No significant listhesis. Mildly prominent gas distended small bowel and colon.   IMPRESSION: No evidence of lumbar spine fracture.   Mild to moderate degenerative changes at L4-L5  and L5-S1.   Mildly prominent, gas distended small bowel and colon.     Electronically Signed   By: Maurine Simmering M.D.   On: 12/25/2022 15:46   Thoracic xrays dated 12/25/22: (not standing) FINDINGS: There is no evidence of thoracic spine fracture. Alignment is normal. There are minimal multilevel degenerative changes.   IMPRESSION: No evidence of thoracic spine fracture. Minimal multilevel degenerative changes.     Electronically Signed   By: Maurine Simmering M.D.   On: 12/25/2022 15:44   Lumbar MRI dated 12/25/22:  FINDINGS: Segmentation:  Standard.   Alignment:  Grade 1 retrolisthesis at L4-5   Vertebrae:  No fracture, evidence of discitis, or bone lesion.   Conus medullaris and cauda equina: Conus extends to the L1 level. Conus and cauda equina appear normal.   Paraspinal and other soft tissues: Negative.   Disc levels:   L1-L2: Normal disc space and facet joints. No spinal canal stenosis. No neural foraminal stenosis.   L2-L3: Normal disc space and facet joints. No spinal canal stenosis. No neural foraminal stenosis.   L3-L4: Small central disc protrusion, unchanged. No spinal canal stenosis. No neural foraminal stenosis.   L4-L5: Worsened small central disc protrusion. Narrowing of both lateral recesses without central spinal canal stenosis. No neural foraminal stenosis.   L5-S1: Unchanged small central disc protrusion. No spinal canal stenosis. No neural foraminal stenosis.   Visualized sacrum: Normal.   IMPRESSION: 1. Worsened small central disc protrusion at L4-L5 with narrowing of both lateral recesses 2. Unchanged small central disc protrusions at L3-4 and L5-S1 without spinal canal or neural foraminal stenosis.     Electronically Signed   By: Ulyses Jarred M.D.   On: 12/25/2022 20:53  I have personally reviewed the images and agree with the above interpretation.  Assessment and Plan: Alex Brandt is a pleasant 47 y.o. male has a history of  chronic intermittent LBP.   Had severe flare up of pain on 12/24/22 when moving and was admitted for pain control.   He continues with constant LBP with bilateral leg pain (entire leg) to his feet. He has intermittent numbness/tingling in legs. Pain is worse with walking, standing, bending, lifting.   He has known central disc bulging and spondylosis at L3-S1. I don't appreciate any nerve compression.   Treatment options discussed with patient and following plan made:   - Order for physical therapy for lumbar spine to [facility] in [place]. Patient to call to schedule appointment.  - Refill given for flexeril  to take prn muscle spasms. Reviewed dosing and side effects. Discussed this can cause drowsiness.  - Referral to PMR at [facility] in [place] to discuss possible lumbar injections.  - I don't think he is a surgical candidate. Will follow up with me for phone visit in 6-8 weeks to be sure he has above scheduled.   I spent a total of 25 minutes in face-to-face and non-face-to-face activities related to this patient's care today including review of outside records, review of imaging, review of symptoms, physical exam, discussion of differential diagnosis, discussion of treatment options, and documentation.   Thank you for involving me in the care of this patient.   Alex Boot PA-C Dept. of Neurosurgery

## 2023-01-05 ENCOUNTER — Encounter: Payer: Self-pay | Admitting: Orthopedic Surgery

## 2023-01-05 ENCOUNTER — Ambulatory Visit (INDEPENDENT_AMBULATORY_CARE_PROVIDER_SITE_OTHER): Payer: Medicaid Other | Admitting: Orthopedic Surgery

## 2023-01-05 VITALS — BP 134/84 | Ht 75.0 in | Wt 202.0 lb

## 2023-01-05 DIAGNOSIS — M5136 Other intervertebral disc degeneration, lumbar region: Secondary | ICD-10-CM

## 2023-01-05 DIAGNOSIS — M4726 Other spondylosis with radiculopathy, lumbar region: Secondary | ICD-10-CM

## 2023-01-05 DIAGNOSIS — M47816 Spondylosis without myelopathy or radiculopathy, lumbar region: Secondary | ICD-10-CM

## 2023-01-05 DIAGNOSIS — M5416 Radiculopathy, lumbar region: Secondary | ICD-10-CM

## 2023-01-05 MED ORDER — CYCLOBENZAPRINE HCL 5 MG PO TABS: 5.0000 mg | ORAL_TABLET | Freq: Three times a day (TID) | ORAL | 0 refills | Status: DC | PRN

## 2023-01-05 NOTE — Patient Instructions (Signed)
It was so nice to see you today. Thank you so much for coming in.    You have some wear and tear in your back and I think this may be causing your pain.   I sent physical therapy orders to [facility] in [place]. You can call them at [phone] if you don't hear from them to schedule your visit.   I want you to see physical medicine and rehab at the [facility] to discuss possible injections in your lower back. You can see Dr. Alba Destine in [place]. They should call you to schedule an appointment or you can call them at [phone].   I will see you back in 6-8 weeks for a phone visit. Please do not hesitate to call if you have any questions or concerns. You can also message me in [place].   Geronimo Boot PA-C [phone]

## 2023-02-07 ENCOUNTER — Telehealth: Payer: Self-pay | Admitting: Orthopedic Surgery

## 2023-02-07 DIAGNOSIS — M5416 Radiculopathy, lumbar region: Secondary | ICD-10-CM

## 2023-02-07 DIAGNOSIS — M5136 Other intervertebral disc degeneration, lumbar region: Secondary | ICD-10-CM

## 2023-02-07 DIAGNOSIS — M51369 Other intervertebral disc degeneration, lumbar region without mention of lumbar back pain or lower extremity pain: Secondary | ICD-10-CM

## 2023-02-07 DIAGNOSIS — M47816 Spondylosis without myelopathy or radiculopathy, lumbar region: Secondary | ICD-10-CM

## 2023-02-07 NOTE — Telephone Encounter (Signed)
Stacy referred patient to PT at [facility] in [facility]. He has been seen once and has an appointment tomorrow. [employer] is asking for a referral with the specific number of visits. Is there anyway that you can put that in his chart? She would like to get the approval in time for his appt tomorrow.

## 2023-02-07 NOTE — Telephone Encounter (Signed)
Referral sent to [place] at [facility] in [place].

## 2023-02-07 NOTE — Telephone Encounter (Signed)
Yes, and please put that on the referral. Thanks

## 2023-02-07 NOTE — Telephone Encounter (Signed)
Dr Myer Haff said we can put 12 visits (twice per week for 6 weeks). Do we need to put a new referral?

## 2023-02-07 NOTE — Telephone Encounter (Signed)
New referral placed. Thanks

## 2023-02-21 NOTE — Progress Notes (Signed)
   Telephone Visit- Progress Note: Referring Physician:  [facility] [address] [place],  [place] [address]  Primary Physician:  [facility]  This visit was performed via telephone.  Patient location: home Provider location: office  I spent a total of 10 minutes non-face-to-face activities for this visit on the date of this encounter including review of current clinical condition and response to treatment.    Patient has given verbal consent to this telephone visits and we reviewed the limitations of a telephone visit. Patient wishes to proceed.    Chief Complaint:  recheck  History of Present Illness: Alex Brandt is a 47 y.o. male has a history of CAD, GERD, hyperlipidemia, HTN, migraines, and valvular insufficiency.   Last seen by me on 01/05/23 for LBP and bilateral leg pain. Had severe flare up of pain on 12/24/22 when moving and was admitted for pain control.    He has known central disc bulging and spondylosis at L3-S1. I don't appreciate any nerve compression.   PT was ordered- did initial consult on 01/13/23 with 4 additional visits through 02/17/23. He saw PMR Mariah Milling) and she recommended PT prior to consideration of injections.   Phone visit scheduled to check his progress.   He continues with constant LBP. PT has helped some with his leg pain, it is still in both legs (entire) but is now intermittent. Pain is still worse with walking, standing, bending, lifting.   His mom passed away 2 weeks ago and he missed some appointments.   PT recommended that he f/u with Dr. Mariah Milling to discuss injections.    He is on PLAVIX.    He smokes 1 ppd x 28 years.    Conservative measures:  Physical therapy: did initial consult on 01/13/23 with 4 additional visits through 02/17/23 Multimodal medical therapy including regular antiinflammatories: tylenol, flexeril, lidocaine patch, robaxin, oxycodone, prednisone Injections: has  had epidural steroid injections in the past with no relief   Past Surgery: no spinal surgery.   Exam: No exam done as this was a telephone encounter.     Imaging: none  I have personally reviewed the images and agree with the above interpretation.  Assessment and Plan: [name] is a pleasant 47 y.o. male has a history of chronic intermittent LBP.    Had severe flare up of pain on 12/24/22 when moving and was admitted for pain control.   He continues with constant LBP. PT has helped some with his leg pain, it is still in both legs (entire) but is now intermittent.   He has known central disc bulging and spondylosis at L3-S1. I don't appreciate any nerve compression.    Treatment options discussed with patient and following plan made:   - Message to Dr. Mariah Milling to have her staff contact him for follow up.  - He will continue to follow with PMR and can follow up with me as needed.    Drake Leach PA-C Neurosurgery

## 2023-02-24 ENCOUNTER — Encounter: Payer: Self-pay | Admitting: Orthopedic Surgery

## 2023-02-24 ENCOUNTER — Ambulatory Visit (INDEPENDENT_AMBULATORY_CARE_PROVIDER_SITE_OTHER): Payer: Medicaid Other | Admitting: Orthopedic Surgery

## 2023-02-24 DIAGNOSIS — M47816 Spondylosis without myelopathy or radiculopathy, lumbar region: Secondary | ICD-10-CM

## 2023-02-24 DIAGNOSIS — M5136 Other intervertebral disc degeneration, lumbar region: Secondary | ICD-10-CM | POA: Diagnosis not present

## 2023-02-24 DIAGNOSIS — M4726 Other spondylosis with radiculopathy, lumbar region: Secondary | ICD-10-CM

## 2023-02-24 DIAGNOSIS — M5416 Radiculopathy, lumbar region: Secondary | ICD-10-CM

## 2023-03-12 ENCOUNTER — Other Ambulatory Visit: Payer: Self-pay

## 2023-03-12 ENCOUNTER — Observation Stay
Admission: EM | Admit: 2023-03-12 | Discharge: 2023-03-13 | DRG: 287 | Disposition: A | Discharge status: Home / Self Care | Payer: Medicaid Other | Attending: Obstetrics and Gynecology | Admitting: Obstetrics and Gynecology

## 2023-03-12 ENCOUNTER — Emergency Department: Payer: Medicaid Other

## 2023-03-12 DIAGNOSIS — R079 Chest pain, unspecified: Secondary | ICD-10-CM | POA: Diagnosis not present

## 2023-03-12 DIAGNOSIS — F17210 Nicotine dependence, cigarettes, uncomplicated: Secondary | ICD-10-CM | POA: Diagnosis not present

## 2023-03-12 DIAGNOSIS — Z8249 Family history of ischemic heart disease and other diseases of the circulatory system: Secondary | ICD-10-CM | POA: Insufficient documentation

## 2023-03-12 DIAGNOSIS — J40 Bronchitis, not specified as acute or chronic: Secondary | ICD-10-CM | POA: Diagnosis not present

## 2023-03-12 DIAGNOSIS — Z96651 Presence of right artificial knee joint: Secondary | ICD-10-CM | POA: Insufficient documentation

## 2023-03-12 DIAGNOSIS — I110 Hypertensive heart disease with heart failure: Secondary | ICD-10-CM | POA: Diagnosis not present

## 2023-03-12 DIAGNOSIS — I5032 Chronic diastolic (congestive) heart failure: Secondary | ICD-10-CM | POA: Diagnosis not present

## 2023-03-12 DIAGNOSIS — I252 Old myocardial infarction: Secondary | ICD-10-CM | POA: Diagnosis not present

## 2023-03-12 DIAGNOSIS — E785 Hyperlipidemia, unspecified: Secondary | ICD-10-CM

## 2023-03-12 DIAGNOSIS — Z1152 Encounter for screening for COVID-19: Secondary | ICD-10-CM | POA: Insufficient documentation

## 2023-03-12 DIAGNOSIS — Z7902 Long term (current) use of antithrombotics/antiplatelets: Secondary | ICD-10-CM | POA: Insufficient documentation

## 2023-03-12 DIAGNOSIS — K219 Gastro-esophageal reflux disease without esophagitis: Secondary | ICD-10-CM | POA: Diagnosis not present

## 2023-03-12 DIAGNOSIS — Z79899 Other long term (current) drug therapy: Secondary | ICD-10-CM | POA: Diagnosis not present

## 2023-03-12 DIAGNOSIS — F17200 Nicotine dependence, unspecified, uncomplicated: Secondary | ICD-10-CM | POA: Diagnosis present

## 2023-03-12 DIAGNOSIS — Z955 Presence of coronary angioplasty implant and graft: Secondary | ICD-10-CM | POA: Insufficient documentation

## 2023-03-12 DIAGNOSIS — I10 Essential (primary) hypertension: Secondary | ICD-10-CM

## 2023-03-12 DIAGNOSIS — I25110 Atherosclerotic heart disease of native coronary artery with unstable angina pectoris: Secondary | ICD-10-CM | POA: Insufficient documentation

## 2023-03-12 DIAGNOSIS — I2510 Atherosclerotic heart disease of native coronary artery without angina pectoris: Secondary | ICD-10-CM | POA: Diagnosis present

## 2023-03-12 DIAGNOSIS — Z9049 Acquired absence of other specified parts of digestive tract: Secondary | ICD-10-CM

## 2023-03-12 LAB — COMPREHENSIVE METABOLIC PANEL
ALT: 21 U/L (ref 0–44)
AST: 24 U/L (ref 15–41)
Albumin: 4.3 g/dL (ref 3.5–5.0)
Alkaline Phosphatase: 68 U/L (ref 38–126)
Anion gap: 8 (ref 5–15)
BUN: 15 mg/dL (ref 6–20)
CO2: 24 mmol/L (ref 22–32)
Calcium: 9 mg/dL (ref 8.9–10.3)
Chloride: 106 mmol/L (ref 98–111)
Creatinine, Ser: 1.35 mg/dL — ABNORMAL HIGH (ref 0.61–1.24)
GFR, Estimated: >60 mL/min (ref >60)
Glucose, Bld: 119 mg/dL — ABNORMAL HIGH (ref 70–99)
Potassium: 4.3 mmol/L (ref 3.5–5.1)
Sodium: 138 mmol/L (ref 135–145)
Total Bilirubin: 0.6 mg/dL (ref 0.3–1.2)
Total Protein: 7.8 g/dL (ref 6.5–8.1)

## 2023-03-12 LAB — PROTIME-INR
INR: 1 (ref 0.8–1.2)
Prothrombin Time: 13.4 seconds (ref 11.4–15.2)

## 2023-03-12 LAB — URINE DRUG SCREEN, QUALITATIVE (ARMC ONLY)
Amphetamines, Ur Screen: NOT DETECTED
Barbiturates, Ur Screen: NOT DETECTED
Benzodiazepine, Ur Scrn: NOT DETECTED
Cannabinoid 50 Ng, Ur ~~LOC~~: NOT DETECTED
Cocaine Metabolite,Ur ~~LOC~~: NOT DETECTED
MDMA (Ecstasy)Ur Screen: NOT DETECTED
Methadone Scn, Ur: NOT DETECTED
Opiate, Ur Screen: NOT DETECTED
Phencyclidine (PCP) Ur S: NOT DETECTED
Tricyclic, Ur Screen: NOT DETECTED

## 2023-03-12 LAB — CBC
HCT: 49.3 % (ref 39.0–52.0)
Hemoglobin: 16.1 g/dL (ref 13.0–17.0)
MCH: 28.9 pg (ref 26.0–34.0)
MCHC: 32.7 g/dL (ref 30.0–36.0)
MCV: 88.5 fL (ref 80.0–100.0)
Platelets: 203 10*3/uL (ref 150–400)
RBC: 5.57 MIL/uL (ref 4.22–5.81)
RDW: 14.1 % (ref 11.5–15.5)
WBC: 9.3 10*3/uL (ref 4.0–10.5)
nRBC: 0 % (ref 0.0–0.2)

## 2023-03-12 LAB — LIPID PANEL
Cholesterol: 122 mg/dL (ref 0–200)
HDL: 35 mg/dL — ABNORMAL LOW (ref >40)
LDL Cholesterol: 75 mg/dL (ref 0–99)
Total CHOL/HDL Ratio: 3.5 RATIO
Triglycerides: 62 mg/dL (ref <150)
VLDL: 12 mg/dL (ref 0–40)

## 2023-03-12 LAB — TROPONIN I (HIGH SENSITIVITY)
Troponin I (High Sensitivity): 5 ng/L (ref <18)
Troponin I (High Sensitivity): 4 ng/L (ref <18)
Troponin I (High Sensitivity): 4 ng/L (ref <18)
Troponin I (High Sensitivity): 4 ng/L (ref <18)
Troponin I (High Sensitivity): 3 ng/L (ref <18)

## 2023-03-12 LAB — BRAIN NATRIURETIC PEPTIDE: B Natriuretic Peptide: 42.3 pg/mL (ref 0.0–100.0)

## 2023-03-12 LAB — HEPARIN LEVEL (UNFRACTIONATED)
Heparin Unfractionated: 0.25 IU/mL — ABNORMAL LOW (ref 0.30–0.70)
Heparin Unfractionated: 0.4 IU/mL (ref 0.30–0.70)

## 2023-03-12 LAB — APTT: aPTT: 32 seconds (ref 24–36)

## 2023-03-12 MED ORDER — IPRATROPIUM-ALBUTEROL 0.5-2.5 (3) MG/3ML IN SOLN
3.0000 mL | Freq: Once | RESPIRATORY_TRACT | Status: AC
  Administered 2023-03-12: 3 mL via RESPIRATORY_TRACT
  Filled 2023-03-12: qty 3

## 2023-03-12 MED ORDER — ENOXAPARIN SODIUM 40 MG/0.4ML IJ SOSY: 40.0000 mg | PREFILLED_SYRINGE | INTRAMUSCULAR | Status: DC

## 2023-03-12 MED ORDER — ROSUVASTATIN CALCIUM 20 MG PO TABS
40.0000 mg | ORAL_TABLET | Freq: Every day | ORAL | Status: DC
  Administered 2023-03-12: 40 mg via ORAL
  Filled 2023-03-12: qty 2

## 2023-03-12 MED ORDER — ALUM & MAG HYDROXIDE-SIMETH 200-200-20 MG/5ML PO SUSP
30.0000 mL | Freq: Once | ORAL | Status: AC
  Administered 2023-03-12: 30 mL via ORAL
  Filled 2023-03-12: qty 30

## 2023-03-12 MED ORDER — PANTOPRAZOLE SODIUM 40 MG PO TBEC
40.0000 mg | DELAYED_RELEASE_TABLET | Freq: Every day | ORAL | Status: DC
  Administered 2023-03-12: 40 mg via ORAL
  Filled 2023-03-12: qty 1

## 2023-03-12 MED ORDER — NITROGLYCERIN 0.4 MG SL SUBL: 0.4000 mg | SUBLINGUAL_TABLET | SUBLINGUAL | Status: DC | PRN

## 2023-03-12 MED ORDER — HEPARIN (PORCINE) 25000 UT/250ML-% IV SOLN
1450.0000 [IU]/h | INTRAVENOUS | Status: DC
  Administered 2023-03-12: 1450 [IU]/h via INTRAVENOUS
  Administered 2023-03-12: 1250 [IU]/h via INTRAVENOUS
  Filled 2023-03-12 (×2): qty 250

## 2023-03-12 MED ORDER — CYCLOBENZAPRINE HCL 10 MG PO TABS: 5.0000 mg | ORAL_TABLET | Freq: Three times a day (TID) | ORAL | Status: DC | PRN

## 2023-03-12 MED ORDER — PANTOPRAZOLE SODIUM 40 MG PO TBEC: 40.0000 mg | DELAYED_RELEASE_TABLET | Freq: Every day | ORAL | Status: DC

## 2023-03-12 MED ORDER — NITROGLYCERIN 0.4 MG SL SUBL
0.4000 mg | SUBLINGUAL_TABLET | Freq: Once | SUBLINGUAL | Status: AC
  Administered 2023-03-12: 0.4 mg via SUBLINGUAL
  Filled 2023-03-12: qty 1

## 2023-03-12 MED ORDER — ACETAMINOPHEN 325 MG PO TABS: 650.0000 mg | ORAL_TABLET | Freq: Four times a day (QID) | ORAL | Status: DC | PRN

## 2023-03-12 MED ORDER — FENTANYL CITRATE PF 50 MCG/ML IJ SOSY: 25.0000 ug | PREFILLED_SYRINGE | INTRAMUSCULAR | Status: DC | PRN

## 2023-03-12 MED ORDER — SODIUM CHLORIDE 0.9 % IV BOLUS
1000.0000 mL | Freq: Once | INTRAVENOUS | Status: AC
  Administered 2023-03-12: 1000 mL via INTRAVENOUS

## 2023-03-12 MED ORDER — HYDRALAZINE HCL 20 MG/ML IJ SOLN: 5.0000 mg | INTRAMUSCULAR | Status: DC | PRN

## 2023-03-12 MED ORDER — ASPIRIN 81 MG PO CHEW
324.0000 mg | CHEWABLE_TABLET | Freq: Once | ORAL | Status: AC
  Administered 2023-03-12: 324 mg via ORAL
  Filled 2023-03-12: qty 4

## 2023-03-12 MED ORDER — CLOPIDOGREL BISULFATE 75 MG PO TABS
75.0000 mg | ORAL_TABLET | Freq: Every day | ORAL | Status: DC
  Administered 2023-03-12: 75 mg via ORAL
  Filled 2023-03-12: qty 1

## 2023-03-12 MED ORDER — ISOSORBIDE MONONITRATE ER 60 MG PO TB24: 30.0000 mg | ORAL_TABLET | Freq: Every day | ORAL | Status: DC

## 2023-03-12 MED ORDER — NICOTINE 21 MG/24HR TD PT24
21.0000 mg | MEDICATED_PATCH | Freq: Every day | TRANSDERMAL | Status: DC
  Administered 2023-03-12: 21 mg via TRANSDERMAL
  Filled 2023-03-12: qty 1

## 2023-03-12 MED ORDER — ALBUTEROL SULFATE (2.5 MG/3ML) 0.083% IN NEBU: 3.0000 mL | INHALATION_SOLUTION | RESPIRATORY_TRACT | Status: DC | PRN

## 2023-03-12 MED ORDER — RANOLAZINE ER 500 MG PO TB12
500.0000 mg | ORAL_TABLET | Freq: Two times a day (BID) | ORAL | Status: DC
  Administered 2023-03-12 (×2): 500 mg via ORAL
  Filled 2023-03-12 (×3): qty 1

## 2023-03-12 MED ORDER — SODIUM CHLORIDE 0.9 % IV SOLN: INTRAVENOUS | Status: DC

## 2023-03-12 MED ORDER — CLOPIDOGREL BISULFATE 75 MG PO TABS: 75.0000 mg | ORAL_TABLET | Freq: Every day | ORAL | Status: DC

## 2023-03-12 MED ORDER — HEPARIN BOLUS VIA INFUSION
4000.0000 [IU] | Freq: Once | INTRAVENOUS | Status: AC
  Administered 2023-03-12: 4000 [IU] via INTRAVENOUS
  Filled 2023-03-12: qty 4000

## 2023-03-12 MED ORDER — HEPARIN BOLUS VIA INFUSION
1300.0000 [IU] | Freq: Once | INTRAVENOUS | Status: AC
  Administered 2023-03-12: 1300 [IU] via INTRAVENOUS
  Filled 2023-03-12: qty 1300

## 2023-03-12 MED ORDER — DM-GUAIFENESIN ER 30-600 MG PO TB12: 1.0000 | ORAL_TABLET | Freq: Two times a day (BID) | ORAL | Status: DC | PRN

## 2023-03-12 MED ORDER — NITROGLYCERIN 2 % TD OINT
1.0000 [in_us] | TOPICAL_OINTMENT | Freq: Four times a day (QID) | TRANSDERMAL | Status: DC
  Administered 2023-03-12 (×3): 1 [in_us] via TOPICAL
  Filled 2023-03-12 (×3): qty 1

## 2023-03-12 MED ORDER — ACETAMINOPHEN 325 MG PO TABS
650.0000 mg | ORAL_TABLET | Freq: Once | ORAL | Status: DC
  Filled 2023-03-12: qty 2

## 2023-03-12 MED ORDER — ONDANSETRON HCL 4 MG/2ML IJ SOLN: 4.0000 mg | Freq: Three times a day (TID) | INTRAMUSCULAR | Status: DC | PRN

## 2023-03-12 NOTE — Consult Note (Signed)
Alex Brandt is a 47 y.o. male  [id number]  Primary Cardiologist: Dr. Juliann Pares Reason for Consultation: Chest pain/unstable angina  HPI: This a 47 year old white male with a past medical history of PCI and stenting of the right coronary artery after inferior wall STEMI on August 2023 presented with recurrent chest pain.  Patient states that he woke up with chest pain at midnight and gradually got worse very similar to his presentation last year thus he came to the emergency room.  He says that he was on isosorbide started by Dr. Juliann Pares and initially it was helping but now it is not.  At this time of evaluation patient still had pressure in his left precordium although is gradually improving with IV heparin.   Review of Systems: No syncope orthopnea PND or leg swelling   Past Medical History:  Diagnosis Date   Angina at rest    Arthritis    Coronary artery disease 05/05/2021   a.) LHC 05/05/2021: EF 55-65%; normal LV function and LVEDP; 50% mLAD, 50% D1, 50% p-mRCA; med mgmt.   DJD (degenerative joint disease) of knee    Dyspnea    GERD (gastroesophageal reflux disease)    HLD (hyperlipidemia)    Hyperkalemia    Hypertension    Migraines    Valvular insufficiency 12/18/2014   a.) TTE 12/18/2014: EF >55%; mild PR, moderate MR/TR.    (Not in a hospital admission)     acetaminophen  650 mg Oral Once   clopidogrel  75 mg Oral Daily   [START ON 03/13/2023] isosorbide mononitrate  30 mg Oral Daily   nicotine  21 mg Transdermal Daily   pantoprazole  40 mg Oral Daily   rosuvastatin  40 mg Oral QHS    Infusions:  heparin 1,250 Units/hr (03/12/23 0921)    Allergies  Allergen Reactions   Shellfish Allergy Anaphylaxis and Swelling   Hydrocodone Nausea And Vomiting   Toradol [Ketorolac Tromethamine] Other (See Comments)    Patient states he cannot take it with one of his heart medications   Tramadol Other (See Comments)    Migraines   Tramadol Hcl Other (See Comments)     Social History   Socioeconomic History   Marital status: Married    Spouse name: Not on file   Number of children: Not on file   Years of education: Not on file   Highest education level: Not on file  Occupational History   Not on file  Tobacco Use   Smoking status: Every Day    Packs/day: 0.50    Years: 20.00    Additional pack years: 0.00    Total pack years: 10.00    Types: Cigarettes   Smokeless tobacco: Never  Vaping Use   Vaping Use: Never used  Substance and Sexual Activity   Alcohol use: Not Currently   Drug use: Never   Sexual activity: Not on file  Other Topics Concern   Not on file  Social History Narrative   Not on file   Social Determinants of Health   Financial Resource Strain: Not on file  Food Insecurity: No Food Insecurity (12/26/2022)   Hunger Vital Sign    Worried About Running Out of Food in the Last Year: Never true    Ran Out of Food in the Last Year: Never true  Transportation Needs: No Transportation Needs (12/26/2022)   PRAPARE - Administrator, Civil Service (Medical): No    Lack of Transportation (Non-Medical): No  Physical Activity: Not on file  Stress: Not on file  Social Connections: Not on file  Intimate Partner Violence: Not At Risk (12/26/2022)   Humiliation, Afraid, Rape, and Kick questionnaire    Fear of Current or Ex-Partner: No    Emotionally Abused: No    Physically Abused: No    Sexually Abused: No    Family History  Problem Relation Age of Onset   Heart attack Mother    Heart attack Father    Heart attack Brother     PHYSICAL EXAM: Vitals:   03/12/23 0900 03/12/23 0911  BP: 105/68   Pulse: (!) 58   Resp: 13   Temp:  98 F (36.7 C)  SpO2: 99%      Intake/Output Summary (Last 24 hours) at 03/12/2023 1027 Last data filed at 03/12/2023 4098 Gross per 24 hour  Intake 1000 ml  Output --  Net 1000 ml    General:  Well appearing. No respiratory difficulty HEENT: normal Neck: supple. no JVD.  Carotids 2+ bilat; no bruits. No lymphadenopathy or thryomegaly appreciated. Cor: PMI nondisplaced. Regular rate & rhythm. No rubs, gallops or murmurs. Lungs: clear Abdomen: soft, nontender, nondistended. No hepatosplenomegaly. No bruits or masses. Good bowel sounds. Extremities: no cyanosis, clubbing, rash, edema Neuro: alert & oriented x 3, cranial nerves grossly intact. moves all 4 extremities w/o difficulty. Affect pleasant.  ECG: Normal sinus rhythm nonspecific ST-T changes  Results for orders placed or performed during the hospital encounter of 03/12/23 (from the past 24 hour(s))  CBC     Status: None   Collection Time: 03/12/23  4:59 AM  Result Value Ref Range   WBC 9.3 4.0 - 10.5 K/uL   RBC 5.57 4.22 - 5.81 MIL/uL   Hemoglobin 16.1 13.0 - 17.0 g/dL   HCT 11.9 14.7 - 82.9 %   MCV 88.5 80.0 - 100.0 fL   MCH 28.9 26.0 - 34.0 pg   MCHC 32.7 30.0 - 36.0 g/dL   RDW 56.2 13.0 - 86.5 %   Platelets 203 150 - 400 K/uL   nRBC 0.0 0.0 - 0.2 %  Comprehensive metabolic panel     Status: Abnormal   Collection Time: 03/12/23  4:59 AM  Result Value Ref Range   Sodium 138 135 - 145 mmol/L   Potassium 4.3 3.5 - 5.1 mmol/L   Chloride 106 98 - 111 mmol/L   CO2 24 22 - 32 mmol/L   Glucose, Bld 119 (H) 70 - 99 mg/dL   BUN 15 6 - 20 mg/dL   Creatinine, Ser 7.84 (H) 0.61 - 1.24 mg/dL   Calcium 9.0 8.9 - 69.6 mg/dL   Total Protein 7.8 6.5 - 8.1 g/dL   Albumin 4.3 3.5 - 5.0 g/dL   AST 24 15 - 41 U/L   ALT 21 0 - 44 U/L   Alkaline Phosphatase 68 38 - 126 U/L   Total Bilirubin 0.6 0.3 - 1.2 mg/dL   GFR, Estimated >29 >52 mL/min   Anion gap 8 5 - 15  Troponin I (High Sensitivity)     Status: None   Collection Time: 03/12/23  4:59 AM  Result Value Ref Range   Troponin I (High Sensitivity) 5 <18 ng/L  Brain natriuretic peptide     Status: None   Collection Time: 03/12/23  4:59 AM  Result Value Ref Range   B Natriuretic Peptide 42.3 0.0 - 100.0 pg/mL  Troponin I (High Sensitivity)      Status: None   Collection  Time: 03/12/23  6:51 AM  Result Value Ref Range   Troponin I (High Sensitivity) 4 <18 ng/L  Troponin I (High Sensitivity)     Status: None   Collection Time: 03/12/23  8:20 AM  Result Value Ref Range   Troponin I (High Sensitivity) 4 <18 ng/L  Lipid panel     Status: Abnormal   Collection Time: 03/12/23  8:20 AM  Result Value Ref Range   Cholesterol 122 0 - 200 mg/dL   Triglycerides 62 <161 mg/dL   HDL 35 (L) >09 mg/dL   Total CHOL/HDL Ratio 3.5 RATIO   VLDL 12 0 - 40 mg/dL   LDL Cholesterol 75 0 - 99 mg/dL  Protime-INR     Status: None   Collection Time: 03/12/23  8:20 AM  Result Value Ref Range   Prothrombin Time 13.4 11.4 - 15.2 seconds   INR 1.0 0.8 - 1.2  APTT     Status: None   Collection Time: 03/12/23  8:20 AM  Result Value Ref Range   aPTT 32 24 - 36 seconds   DG Chest 1 View  Result Date: 03/12/2023 CLINICAL DATA:  47 year old male with history of chest pain. EXAM: CHEST  1 VIEW COMPARISON:  Chest x-ray 06/27/2020. FINDINGS: Lung volumes are normal. Mild diffuse interstitial prominence and peribronchial cuffing, concerning for an acute bronchitis. No consolidative airspace disease. No pleural effusions. No pneumothorax. No pulmonary nodule or mass noted. Pulmonary vasculature and the cardiomediastinal silhouette are within normal limits. IMPRESSION: 1. Findings concerning for possible acute bronchitis, as above. Electronically Signed   By: Trudie Reed M.D.   On: 03/12/2023 06:01     ASSESSMENT AND PLAN: Unstable angina with all 3 sets of cardiac enzymes and EKG unremarkable.  However patient continues to have pressure type of chest pain on the left precordium on heparin.  Will add Nitropaste 1 inch every 6 hours.  Patient had 50% disease in mid LAD along with 50% first diagonal beside RCA disease which was treated with PCI and stenting in August last year.  Patient continues to smoke and is high risk for restenosis also.  Thus we will schedule  the patient for cardiac catheterization in a.m. and will hydrate the patient overnight since creatinine is mildly elevated.  Patient has agreed to the plan and risk benefits.  Alex Brandt

## 2023-03-12 NOTE — ED Notes (Signed)
Pt reports no pain relief from chest pain following ntg administration.  Pt does endorse HA post ntg.  BP checked and found to be 97/60.  EDP made aware and placed orders accordingly.

## 2023-03-12 NOTE — Progress Notes (Signed)
ANTICOAGULATION CONSULT NOTE  Pharmacy Consult for heparin infusion Indication: chest pain/ACS  Allergies  Allergen Reactions   Shellfish Allergy Anaphylaxis and Swelling   Hydrocodone Nausea And Vomiting   No [employer] Food Allergies Swelling    The pt is allergic to tea ex. Like sweet tea to drink   Toradol [Ketorolac Tromethamine] Other (See Comments)    Patient states he cannot take it with one of his heart medications   Tramadol Other (See Comments)    Migraines   Tramadol Hcl Other (See Comments)    Patient Measurements: Height: 6\' 3"  (190.5 cm) Weight: 90.7 kg (200 lb) IBW/kg (Calculated) : 84.5 Heparin Dosing Weight: 90.7 kg  Vital Signs: Temp: 98.2 F (36.8 C) (06/02 1826) Temp Source: Oral (06/02 1826) BP: 110/55 (06/02 2200) Pulse Rate: 58 (06/02 2200)  Labs: Recent Labs    03/12/23 0459 03/12/23 0651 03/12/23 0820 03/12/23 1112 03/12/23 1406 03/12/23 1541 03/12/23 2259  HGB 16.1  --   --   --   --   --   --   HCT 49.3  --   --   --   --   --   --   PLT 203  --   --   --   --   --   --   APTT  --   --  32  --   --   --   --   LABPROT  --   --  13.4  --   --   --   --   INR  --   --  1.0  --   --   --   --   HEPARINUNFRC  --   --   --   --   --  0.25* 0.40  CREATININE 1.35*  --   --   --   --   --   --   TROPONINIHS 5   < > 4 4 3   --   --    < > = values in this interval not displayed.     Estimated Creatinine Clearance: 82.6 mL/min (A) (by C-G formula based on SCr of 1.35 mg/dL (H)).   Medical History: Past Medical History:  Diagnosis Date   Angina at rest    Arthritis    Coronary artery disease 05/05/2021   a.) LHC 05/05/2021: EF 55-65%; normal LV function and LVEDP; 50% mLAD, 50% D1, 50% p-mRCA; med mgmt.   DJD (degenerative joint disease) of knee    Dyspnea    GERD (gastroesophageal reflux disease)    HLD (hyperlipidemia)    Hyperkalemia    Hypertension    Migraines    Valvular insufficiency 12/18/2014   a.) TTE 12/18/2014: EF  >55%; mild PR, moderate MR/TR.    Assessment: 47 y.o. male history of CAD status post stents in August presents to the ER for evaluation of chest pain. According to my review of PMH there is no chronic anticoagulation present prior to arrival  Baseline labs completed, normal values  6/2 1541  HL= 0.25 Subtherapeutic, 1250 > 1450 u/hr 6/2 2259 HL 0.40, therapeutic x 1  Goal of Therapy:  Heparin level 0.3-0.7 units/ml Monitor platelets by anticoagulation protocol: Yes   Plan:  Continue heparin infusion at 1450 units/hr Recheck HL w/ AM labs to confirm Planning cardiac cath in am 6/3 Continue to monitor H&H and platelets  Otelia Sergeant, PharmD, [facility] 03/12/2023 11:57 PM

## 2023-03-12 NOTE — H&P (Signed)
History and Physical    Alex Brandt YNW:[id number] DOB: 11-Jun-1976 DOA: 03/12/2023  Referring MD/NP/PA:   PCP: [facility]   Patient coming from:  The patient is coming from home.     Chief Complaint: chest pain  HPI: Alex Brandt is a 47 y.o. male with medical history significant of CAD, STEMI, s/p of stent 05/2022, HTN, HLD, dCHf, GERD, tobacco abuse, who presents with chest pain.  Patient states that he has intermittent chest pain for more than 2 weeks, which has worsened since last night, waking him up from sleep.  His chest pain is located in middle and the right side of chest, constant, initially 8 out of sensitivity, currently 5 out of 10 in severity, pressure-like, radiating to left arm and shoulder, not alleviated or aggravated by any known factors.  Not associated with shortness of breath or cough.  No fever or chills.  No recent long distance traveling.  Patient has nausea, no vomiting, diarrhea or abdominal pain.  No symptoms of UTI.  No recent fall or rectal bleeding.  Patient states that he took Imdur without improvement at home.  Data reviewed independently and ED Course: pt was found to have trop 5 --> 4, WBC 9.3, GFR> 60, temperature normal, soft blood pressure 97/60-114/73, heart rate 50-80s, RR 18, oxygen saturation 98% on room air.  Chest x-ray showed bronchitis change.  Patient is placed on TeleMed for obs. Consulted Dr. Welton Flakes of card.   EKG: I have personally reviewed.    Review of Systems:   General: no fevers, chills, no body weight gain, fatigue HEENT: no blurry vision, hearing changes or sore throat Respiratory: no dyspnea, coughing, wheezing CV: has chest pain, no palpitations GI: has nausea, no vomiting, abdominal pain, diarrhea, constipation GU: no dysuria, burning on urination, increased urinary frequency, hematuria  Ext: no leg edema Neuro: no unilateral weakness, numbness, or tingling, no vision change or hearing  loss Skin: no rash, no skin tear. MSK: No muscle spasm, no deformity, no limitation of range of movement in spin Heme: No easy bruising.  Travel history: No recent long distant travel.   Allergy:  Allergies  Allergen Reactions   Shellfish Allergy Anaphylaxis and Swelling   Hydrocodone Nausea And Vomiting   Toradol [Ketorolac Tromethamine] Other (See Comments)    Patient states he cannot take it with one of his heart medications   Tramadol Other (See Comments)    Migraines   Tramadol Hcl Other (See Comments)    Past Medical History:  Diagnosis Date   Angina at rest    Arthritis    Coronary artery disease 05/05/2021   a.) LHC 05/05/2021: EF 55-65%; normal LV function and LVEDP; 50% mLAD, 50% D1, 50% p-mRCA; med mgmt.   DJD (degenerative joint disease) of knee    Dyspnea    GERD (gastroesophageal reflux disease)    HLD (hyperlipidemia)    Hyperkalemia    Hypertension    Migraines    Valvular insufficiency 12/18/2014   a.) TTE 12/18/2014: EF >55%; mild PR, moderate MR/TR.    Past Surgical History:  Procedure Laterality Date   APPENDECTOMY     CARDIAC CATHETERIZATION     CHOLECYSTECTOMY     COLONOSCOPY     CORONARY/GRAFT ACUTE MI REVASCULARIZATION N/A 06/02/2022   Procedure: Coronary/Graft Acute MI Revascularization;  Surgeon: Alwyn Pea, MD;  Location: [facility] INVASIVE CV LAB;  Service: Cardiovascular;  Laterality: N/A;   ESOPHAGOGASTRODUODENOSCOPY     KNEE ARTHROSCOPY  WITH MEDIAL MENISECTOMY Right 08/30/2021   Procedure: Right knee arthroscopic partial medial meniscectomy;  Surgeon: Signa Kell, MD;  Location: [facility] ORS;  Service: Orthopedics;  Laterality: Right;   KNEE SURGERY Right    meniscus repair in [place] and again at [facility]   LEFT HEART CATH AND CORONARY ANGIOGRAPHY Left 05/05/2021   Procedure: LEFT HEART CATH AND CORONARY ANGIOGRAPHY;  Surgeon: Alwyn Pea, MD;  Location: [facility] INVASIVE CV LAB;  Service: Cardiovascular;  Laterality: Left;   LEFT  HEART CATH AND CORONARY ANGIOGRAPHY N/A 06/02/2022   Procedure: LEFT HEART CATH AND CORONARY ANGIOGRAPHY;  Surgeon: Alwyn Pea, MD;  Location: [facility] INVASIVE CV LAB;  Service: Cardiovascular;  Laterality: N/A;   PARTIAL KNEE ARTHROPLASTY Right 02/15/2022   Procedure: UNICOMPARTMENTAL KNEE;  Surgeon: Christena Flake, MD;  Location: [facility] ORS;  Service: Orthopedics;  Laterality: Right;    Social History:  reports that he has been smoking cigarettes. He has a 10.00 pack-year smoking history. He has never used smokeless tobacco. He reports that he does not currently use alcohol. He reports that he does not use drugs.  Family History:  Family History  Problem Relation Age of Onset   Heart attack Mother    Heart attack Father    Heart attack Brother      Prior to Admission medications   Medication Sig Start Date End Date Taking? Authorizing Provider  acetaminophen (TYLENOL) 325 MG tablet Take 2 tablets (650 mg total) by mouth every 6 (six) hours as needed for mild pain, fever or headache (or Fever >/= 101). 12/27/22   Loyce Dys, MD  clopidogrel (PLAVIX) 75 MG tablet Take 75 mg by mouth daily.    [provider]  cyclobenzaprine (FLEXERIL) 5 MG tablet Take 1 tablet (5 mg total) by mouth 3 (three) times daily as needed for muscle spasms. This can make you sleepy. 01/05/23   Drake Leach, PA-C  furosemide (LASIX) 20 MG tablet Take 20 mg by mouth daily as needed for fluid. 08/08/22   [provider]  isosorbide mononitrate (IMDUR) 30 MG 24 hr tablet Take 30 mg by mouth daily.    [provider]  nitroGLYCERIN (NITROSTAT) 0.4 MG SL tablet Place 0.4 mg under the tongue every 5 (five) minutes as needed for chest pain. 04/22/21   [provider]  pantoprazole (PROTONIX) 40 MG tablet Take 40 mg by mouth daily.    [provider]  predniSONE (DELTASONE) 10 MG tablet Take 4 tablets once a day for 3 days starting tomorrow, 3 tablets once a day for 3 days, 2  tablets for 3 days and then 1 tablet once a day for 3 days 12/25/22   Tommi Rumps, PA-C  rosuvastatin (CRESTOR) 40 MG tablet Take 40 mg by mouth at bedtime.    [provider]    Physical Exam: Vitals:   03/12/23 0630 03/12/23 0730 03/12/23 0900 03/12/23 0911  BP: 115/76 100/64 105/68   Pulse: 64 61 (!) 58   Resp: 13 16 13    Temp:    98 F (36.7 C)  TempSrc:    Oral  SpO2: 99% 98% 99%   Weight:      Height:       General: Not in acute distress HEENT:       Eyes: PERRL, EOMI, no jaundice       ENT: No discharge from the ears and nose, no pharynx injection, no tonsillar enlargement.  Neck: No JVD, no bruit, no mass felt. Heme: No neck lymph node enlargement. Cardiac: S1/S2, RRR, No murmurs, No gallops or rubs. Respiratory: No rales, wheezing, rhonchi or rubs. GI: Soft, nondistended, nontender, no rebound pain, no organomegaly, BS present. GU: No hematuria Ext: No pitting leg edema bilaterally. 1+DP/PT pulse bilaterally. Musculoskeletal: No joint deformities, No joint redness or warmth, no limitation of ROM in spin. Skin: No rashes.  Neuro: Alert, oriented X3, cranial nerves II-XII grossly intact, moves all extremities normally. Psych: Patient is not psychotic, no suicidal or hemocidal ideation.  Labs on Admission: I have personally reviewed following labs and imaging studies  CBC: Recent Labs  Lab 03/12/23 0459  WBC 9.3  HGB 16.1  HCT 49.3  MCV 88.5  PLT 203   Basic Metabolic Panel: Recent Labs  Lab 03/12/23 0459  NA 138  K 4.3  CL 106  CO2 24  GLUCOSE 119*  BUN 15  CREATININE 1.35*  CALCIUM 9.0   GFR: Estimated Creatinine Clearance: 82.6 mL/min (A) (by C-G formula based on SCr of 1.35 mg/dL (H)). Liver Function Tests: Recent Labs  Lab 03/12/23 0459  AST 24  ALT 21  ALKPHOS 68  BILITOT 0.6  PROT 7.8  ALBUMIN 4.3   No results for input(s): "LIPASE", "AMYLASE" in the last 168 hours. No results for input(s): "AMMONIA" in the last  168 hours. Coagulation Profile: Recent Labs  Lab 03/12/23 0820  INR 1.0   Cardiac Enzymes: No results for input(s): "CKTOTAL", "CKMB", "CKMBINDEX", "TROPONINI" in the last 168 hours. BNP (last 3 results) No results for input(s): "PROBNP" in the last 8760 hours. HbA1C: No results for input(s): "HGBA1C" in the last 72 hours. CBG: No results for input(s): "GLUCAP" in the last 168 hours. Lipid Profile: Recent Labs    03/12/23 0820  CHOL 122  HDL 35*  LDLCALC 75  TRIG 62  CHOLHDL 3.5   Thyroid Function Tests: No results for input(s): "TSH", "T4TOTAL", "FREET4", "T3FREE", "THYROIDAB" in the last 72 hours. Anemia Panel: No results for input(s): "VITAMINB12", "FOLATE", "FERRITIN", "TIBC", "IRON", "RETICCTPCT" in the last 72 hours. Urine analysis:    Component Value Date/Time   COLORURINE YELLOW (A) 02/01/2022 1054   APPEARANCEUR CLEAR (A) 02/01/2022 1054   LABSPEC 1.026 02/01/2022 1054   PHURINE 5.0 02/01/2022 1054   GLUCOSEU NEGATIVE 02/01/2022 1054   HGBUR NEGATIVE 02/01/2022 1054   BILIRUBINUR NEGATIVE 02/01/2022 1054   KETONESUR NEGATIVE 02/01/2022 1054   PROTEINUR NEGATIVE 02/01/2022 1054   NITRITE NEGATIVE 02/01/2022 1054   LEUKOCYTESUR NEGATIVE 02/01/2022 1054   Sepsis Labs: @LABRCNTIP (procalcitonin:4,lacticidven:4) )No results found for this or any previous visit (from the past 240 hour(s)).   Radiological Exams on Admission: DG Chest 1 View  Result Date: 03/12/2023 CLINICAL DATA:  47 year old male with history of chest pain. EXAM: CHEST  1 VIEW COMPARISON:  Chest x-ray 06/27/2020. FINDINGS: Lung volumes are normal. Mild diffuse interstitial prominence and peribronchial cuffing, concerning for an acute bronchitis. No consolidative airspace disease. No pleural effusions. No pneumothorax. No pulmonary nodule or mass noted. Pulmonary vasculature and the cardiomediastinal silhouette are within normal limits. IMPRESSION: 1. Findings concerning for possible acute  bronchitis, as above. Electronically Signed   By: Trudie Reed M.D.   On: 03/12/2023 06:01      Assessment/Plan Principal Problem:   Chest pain Active Problems:   Coronary artery disease   Hypertension   HLD (hyperlipidemia)   Chronic diastolic CHF (congestive heart failure) (HCC)   Nicotine dependence   Assessment and Plan:  Chest pain and hx of coronary artery disease: s/p of stent. Trop 5 --> 4 --> 4.  His chest pain has been persistent, concerning for unstable angina. Consulted Dr. Welton Flakes of card.  - place to tele bed for observation - IV heparin - Trend Trop - prn Nitroglycerin, fentanyl, and plavix, crestor, imdur - pt received 324 mg of ASA  - Risk factor stratification: will check FLP and A1C  - check UDS  Hypertension: Blood pressures are soft -IV hydralazine as needed -Imdur with holding parameters of SBP< 95  HLD (hyperlipidemia) -Crestor  Chronic diastolic CHF (congestive heart failure) (HCC): 2D echo on 06/02/2022 showed EF of 50 to 50% with grade 1 diastolic dysfunction. Patient does not have leg edema or JVD.  CHF is compensated. -Hold Lasix  Nicotine dependence -Nicotine patch     DVT ppx: SQ Lovenox  Code Status: Full code    Family Communication:  Yes, patient's wife by phone  Disposition Plan:  Anticipate discharge back to previous environment  Consults called:  Consulted Dr. Welton Flakes of card.  Admission status and Level of care: Telemetry Cardiac:   for obs    Dispo: The patient is from: Home              Anticipated d/c is to: Home              Anticipated d/c date is: 1 day              Patient currently is not medically stable to d/c.    Severity of Illness:  The appropriate patient status for this patient is OBSERVATION. Observation status is judged to be reasonable and necessary in order to provide the required intensity of service to ensure the patient's safety. The patient's presenting symptoms, physical exam findings, and  initial radiographic and laboratory data in the context of their medical condition is felt to place them at decreased risk for further clinical deterioration. Furthermore, it is anticipated that the patient will be medically stable for discharge from the hospital within 2 midnights of admission.        Date of Service 03/12/2023    Lorretta Harp [facility]   If 7PM-7AM, please contact night-coverage [email or web address] 03/12/2023, 10:29 AM

## 2023-03-12 NOTE — ED Triage Notes (Signed)
Left sided chest pains beginning ~0030 tonight that woke him from sleep. Pain radiates to left shoulder and arm. Says very similar presentation as his STEMI in 05/2022.   Follows with Cardiology at [facility] but says he missed last follow up as multiple life stressors occurred.    EMS called out to house and completed EKG ~3am but declined transportation at that time.   Took a dose of Isosorbide this morning when pain manifested.

## 2023-03-12 NOTE — ED Notes (Signed)
Pt requested to ambulated in hallway with family by side. Pt walking around hallways with steady gait.

## 2023-03-12 NOTE — ED Notes (Signed)
Pt up to toilet. Pt provided with ginger ale and ice

## 2023-03-12 NOTE — Progress Notes (Signed)
ANTICOAGULATION CONSULT NOTE  Pharmacy Consult for heparin infusion Indication: chest pain/ACS  Allergies  Allergen Reactions   Shellfish Allergy Anaphylaxis and Swelling   Hydrocodone Nausea And Vomiting   Toradol [Ketorolac Tromethamine] Other (See Comments)    Patient states he cannot take it with one of his heart medications   Tramadol Other (See Comments)    Migraines   Tramadol Hcl Other (See Comments)    Patient Measurements: Height: 6\' 3"  (190.5 cm) Weight: 90.7 kg (200 lb) IBW/kg (Calculated) : 84.5 Heparin Dosing Weight: 90.7 kg  Vital Signs: Temp: 98.2 F (36.8 C) (06/02 0449) BP: 100/64 (06/02 0730) Pulse Rate: 61 (06/02 0730)  Labs: Recent Labs    03/12/23 0459 03/12/23 0651  HGB 16.1  --   HCT 49.3  --   PLT 203  --   CREATININE 1.35*  --   TROPONINIHS 5 4    Estimated Creatinine Clearance: 82.6 mL/min (A) (by C-G formula based on SCr of 1.35 mg/dL (H)).   Medical History: Past Medical History:  Diagnosis Date   Angina at rest    Arthritis    Coronary artery disease 05/05/2021   a.) LHC 05/05/2021: EF 55-65%; normal LV function and LVEDP; 50% mLAD, 50% D1, 50% p-mRCA; med mgmt.   DJD (degenerative joint disease) of knee    Dyspnea    GERD (gastroesophageal reflux disease)    HLD (hyperlipidemia)    Hyperkalemia    Hypertension    Migraines    Valvular insufficiency 12/18/2014   a.) TTE 12/18/2014: EF >55%; mild PR, moderate MR/TR.    Assessment: 47 y.o. male history of CAD status post stents in August presents to the ER for evaluation of chest pain. According to my review of PMH there is no chronic anticoagulation present prior to arrival  Baseline labs completed, normal values  Goal of Therapy:  Heparin level 0.3-0.7 units/ml Monitor platelets by anticoagulation protocol: Yes   Plan:  Give 4000 units bolus x 1 Start heparin infusion at 1250 units/hr Check anti-Xa level in 6 hours and daily while on heparin Continue to monitor H&H  and platelets  Lowella Bandy 03/12/2023,8:29 AM

## 2023-03-12 NOTE — ED Provider Notes (Signed)
[facility] Provider Note    Event Date/Time   First MD Initiated Contact with Patient 03/12/23 [phone]     (approximate)   History   Chest Pain   HPI  Alex Brandt is a 47 y.o. male history of CAD status post stents in August presents to the ER for evaluation of chest pain pressure feels like someone sitting on his chest with discomfort radiating to his left arm and elbow.  He has been having intermittent pain over the past few weeks but became acutely worse this evening.  States he has been compliant with his medications.  Denies any fevers or chills no shortness of breath.  No pain radiating ripping through to his back.     Physical Exam   Triage Vital Signs: ED Triage Vitals  Enc Vitals Group     BP 03/12/23 0449 128/75     Pulse Rate 03/12/23 0449 84     Resp 03/12/23 0449 18     Temp 03/12/23 0449 98.2 F (36.8 C)     Temp src --      SpO2 03/12/23 0449 99 %     Weight 03/12/23 0452 200 lb (90.7 kg)     Height 03/12/23 0452 6\' 3"  (1.905 m)     Head Circumference --      Peak Flow --      Pain Score 03/12/23 0452 8     Pain Loc --      Pain Edu? --      Excl. in GC? --     Most recent vital signs: Vitals:   03/12/23 0600 03/12/23 0630  BP: 112/67 115/76  Pulse: 73 64  Resp: 14 13  Temp:    SpO2: 98% 99%     Constitutional: Alert  Eyes: Conjunctivae are normal.  Head: Atraumatic. Nose: No congestion/rhinnorhea. Mouth/Throat: Mucous membranes are moist.   Neck: Painless ROM.  Cardiovascular:   Good peripheral circulation. Respiratory: Normal respiratory effort.  No retractions.  Gastrointestinal: Soft and nontender.  Musculoskeletal:  no deformity Neurologic:  MAE spontaneously. No gross focal neurologic deficits are appreciated.  Skin:  Skin is warm, dry and intact. No rash noted. Psychiatric: Mood and affect are normal. Speech and behavior are normal.    ED Results / Procedures / Treatments   Labs (all labs  ordered are listed, but only abnormal results are displayed) Labs Reviewed  COMPREHENSIVE METABOLIC PANEL - Abnormal; Notable for the following components:      Result Value   Glucose, Bld 119 (*)    Creatinine, Ser 1.35 (*)    All other components within normal limits  CBC  TROPONIN I (HIGH SENSITIVITY)  TROPONIN I (HIGH SENSITIVITY)     EKG  ED ECG REPORT I, Willy Eddy, the attending physician, personally viewed and interpreted this ECG.   Date: 03/12/2023  EKG Time: 4:54  Rate: 70  Rhythm: sinus  Axis: normal  Intervals: normal  ST&T Change: no stemi, no depressions    RADIOLOGY Please see ED Course for my review and interpretation.  I personally reviewed all radiographic images ordered to evaluate for the above acute complaints and reviewed radiology reports and findings.  These findings were personally discussed with the patient.  Please see medical record for radiology report.    PROCEDURES:  Critical Care performed: No  Procedures   MEDICATIONS ORDERED IN ED: Medications  acetaminophen (TYLENOL) tablet 650 mg (0 mg Oral Hold 03/12/23 0516)  aspirin chewable tablet 324  mg (324 mg Oral Given 03/12/23 0504)  nitroGLYCERIN (NITROSTAT) SL tablet 0.4 mg (0.4 mg Sublingual Given 03/12/23 0504)  sodium chloride 0.9 % bolus 1,000 mL (0 mLs Intravenous Stopped 03/12/23 0609)  alum & mag hydroxide-simeth (MAALOX/MYLANTA) 200-200-20 MG/5ML suspension 30 mL (30 mLs Oral Given 03/12/23 0514)  ipratropium-albuterol (DUONEB) 0.5-2.5 (3) MG/3ML nebulizer solution 3 mL (3 mLs Nebulization Given 03/12/23 0609)     IMPRESSION / MDM / ASSESSMENT AND PLAN / ED COURSE  I reviewed the triage vital signs and the nursing notes.                              Differential diagnosis includes, but is not limited to, ACS, pericarditis, esophagitis, boerhaaves, pe, dissection, pna, bronchitis, costochondritis  Patient presenting to the ER for evaluation of symptoms as described above.   Based on symptoms, risk factors and considered above differential, this presenting complaint could reflect a potentially life-threatening illness therefore the patient will be placed on continuous pulse oximetry and telemetry for monitoring.  Laboratory evaluation will be sent to evaluate for the above complaints.      Clinical Course as of 03/12/23 0656  Sun Mar 12, 2023  6045 Patient with no change in symptoms after nitro.  Blood pressure little soft complaining of headache now will give Tylenol will give IV fluids.  EKG nonischemic.  Awaiting initial troponin.  Remainder of exam reassuring. [PR]  0517 Chest x-ray on my review and interpretation without evidence of consolidation or pneumothorax. [PR]  [phone] Patient reassessed.  No change after nebulizer.  He does continue to smoke.  Still having chest discomfort.  Given his risk factors with persistent pain will consult hospitalist for admission. [PR]    Clinical Course User Index [PR] Willy Eddy, MD     FINAL CLINICAL IMPRESSION(S) / ED DIAGNOSES   Final diagnoses:  Chest pain, unspecified type     Rx / DC Orders   ED Discharge Orders     None        Note:  This document was prepared using Dragon voice recognition software and may include unintentional dictation errors.    Willy Eddy, MD 03/12/23 [phone]

## 2023-03-12 NOTE — ED Notes (Signed)
Ladona Ridgel RN given report.

## 2023-03-12 NOTE — Progress Notes (Signed)
ANTICOAGULATION CONSULT NOTE  Pharmacy Consult for heparin infusion Indication: chest pain/ACS  Allergies  Allergen Reactions   Shellfish Allergy Anaphylaxis and Swelling   Hydrocodone Nausea And Vomiting   Toradol [Ketorolac Tromethamine] Other (See Comments)    Patient states he cannot take it with one of his heart medications   Tramadol Other (See Comments)    Migraines   Tramadol Hcl Other (See Comments)    Patient Measurements: Height: 6\' 3"  (190.5 cm) Weight: 90.7 kg (200 lb) IBW/kg (Calculated) : 84.5 Heparin Dosing Weight: 90.7 kg  Vital Signs: Temp: 98.2 F (36.8 C) (06/02 1418) Temp Source: Oral (06/02 1418) BP: 101/62 (06/02 1430) Pulse Rate: 63 (06/02 1430)  Labs: Recent Labs    03/12/23 0459 03/12/23 0651 03/12/23 0820 03/12/23 1112 03/12/23 1406 03/12/23 1541  HGB 16.1  --   --   --   --   --   HCT 49.3  --   --   --   --   --   PLT 203  --   --   --   --   --   APTT  --   --  32  --   --   --   LABPROT  --   --  13.4  --   --   --   INR  --   --  1.0  --   --   --   HEPARINUNFRC  --   --   --   --   --  0.25*  CREATININE 1.35*  --   --   --   --   --   TROPONINIHS 5   < > 4 4 3   --    < > = values in this interval not displayed.     Estimated Creatinine Clearance: 82.6 mL/min (A) (by C-G formula based on SCr of 1.35 mg/dL (H)).   Medical History: Past Medical History:  Diagnosis Date   Angina at rest    Arthritis    Coronary artery disease 05/05/2021   a.) LHC 05/05/2021: EF 55-65%; normal LV function and LVEDP; 50% mLAD, 50% D1, 50% p-mRCA; med mgmt.   DJD (degenerative joint disease) of knee    Dyspnea    GERD (gastroesophageal reflux disease)    HLD (hyperlipidemia)    Hyperkalemia    Hypertension    Migraines    Valvular insufficiency 12/18/2014   a.) TTE 12/18/2014: EF >55%; mild PR, moderate MR/TR.    Assessment: 47 y.o. male history of CAD status post stents in August presents to the ER for evaluation of chest pain.  According to my review of PMH there is no chronic anticoagulation present prior to arrival  Baseline labs completed, normal values  6/2 1541  HL= 0.25 Subtherapeutic, 1250 > 1450 u/hr   Goal of Therapy:  Heparin level 0.3-0.7 units/ml Monitor platelets by anticoagulation protocol: Yes   Plan:  6/2 1541  HL= 0.25 Subtherapeutic  1300 units bolus x 1 Increase heparin infusion to 1450 units/hr Check anti-Xa level in 6 hours after rate change Planning cardiac cath in am 6/3 Continue to monitor H&H and platelets  Amaree Leeper A 03/12/2023,4:21 PM

## 2023-03-12 NOTE — ED Notes (Signed)
Pt old nitro patch removed

## 2023-03-13 ENCOUNTER — Encounter
Admission: EM | Disposition: A | Discharge status: Home / Self Care | Payer: Self-pay | Source: Home / Self Care | Attending: Student in an Organized Health Care Education/Training Program

## 2023-03-13 ENCOUNTER — Observation Stay
Admit: 2023-03-13 | Discharge: 2023-03-13 | Disposition: A | Discharge status: Home / Self Care | Payer: Medicaid Other | Attending: Internal Medicine | Admitting: Internal Medicine

## 2023-03-13 ENCOUNTER — Observation Stay: Admit: 2023-03-13 | Payer: Medicaid Other

## 2023-03-13 DIAGNOSIS — Z8249 Family history of ischemic heart disease and other diseases of the circulatory system: Secondary | ICD-10-CM | POA: Diagnosis not present

## 2023-03-13 DIAGNOSIS — R079 Chest pain, unspecified: Secondary | ICD-10-CM | POA: Diagnosis not present

## 2023-03-13 DIAGNOSIS — F17210 Nicotine dependence, cigarettes, uncomplicated: Secondary | ICD-10-CM | POA: Diagnosis present

## 2023-03-13 DIAGNOSIS — Z1152 Encounter for screening for COVID-19: Secondary | ICD-10-CM | POA: Diagnosis not present

## 2023-03-13 DIAGNOSIS — Z955 Presence of coronary angioplasty implant and graft: Secondary | ICD-10-CM | POA: Diagnosis not present

## 2023-03-13 DIAGNOSIS — I5032 Chronic diastolic (congestive) heart failure: Secondary | ICD-10-CM | POA: Diagnosis present

## 2023-03-13 DIAGNOSIS — Z96651 Presence of right artificial knee joint: Secondary | ICD-10-CM | POA: Diagnosis present

## 2023-03-13 DIAGNOSIS — Z79899 Other long term (current) drug therapy: Secondary | ICD-10-CM | POA: Diagnosis not present

## 2023-03-13 DIAGNOSIS — Z7902 Long term (current) use of antithrombotics/antiplatelets: Secondary | ICD-10-CM | POA: Diagnosis not present

## 2023-03-13 DIAGNOSIS — E785 Hyperlipidemia, unspecified: Secondary | ICD-10-CM | POA: Diagnosis present

## 2023-03-13 DIAGNOSIS — I110 Hypertensive heart disease with heart failure: Secondary | ICD-10-CM | POA: Diagnosis present

## 2023-03-13 DIAGNOSIS — Z9049 Acquired absence of other specified parts of digestive tract: Secondary | ICD-10-CM | POA: Diagnosis not present

## 2023-03-13 DIAGNOSIS — J40 Bronchitis, not specified as acute or chronic: Secondary | ICD-10-CM | POA: Diagnosis present

## 2023-03-13 DIAGNOSIS — I25110 Atherosclerotic heart disease of native coronary artery with unstable angina pectoris: Secondary | ICD-10-CM | POA: Diagnosis not present

## 2023-03-13 DIAGNOSIS — I252 Old myocardial infarction: Secondary | ICD-10-CM | POA: Diagnosis not present

## 2023-03-13 DIAGNOSIS — K219 Gastro-esophageal reflux disease without esophagitis: Secondary | ICD-10-CM | POA: Diagnosis present

## 2023-03-13 HISTORY — PX: LEFT HEART CATH AND CORONARY ANGIOGRAPHY: CATH118249

## 2023-03-13 LAB — BASIC METABOLIC PANEL
Anion gap: 8 (ref 5–15)
BUN: 15 mg/dL (ref 6–20)
CO2: 21 mmol/L — ABNORMAL LOW (ref 22–32)
Calcium: 7.9 mg/dL — ABNORMAL LOW (ref 8.9–10.3)
Chloride: 108 mmol/L (ref 98–111)
Creatinine, Ser: 1.24 mg/dL (ref 0.61–1.24)
GFR, Estimated: >60 mL/min (ref >60)
Glucose, Bld: 107 mg/dL — ABNORMAL HIGH (ref 70–99)
Potassium: 3.9 mmol/L (ref 3.5–5.1)
Sodium: 137 mmol/L (ref 135–145)

## 2023-03-13 LAB — ECHOCARDIOGRAM COMPLETE
AR max vel: 2.71 cm2
AV Area VTI: 3.55 cm2
AV Area mean vel: 2.8 cm2
AV Mean grad: 2.5 mmHg
AV Peak grad: 4.4 mmHg
Ao pk vel: 1.05 m/s
Height: 75 in
S' Lateral: 3.5 cm
Weight: 3200 oz

## 2023-03-13 LAB — CBC
HCT: 45 % (ref 39.0–52.0)
Hemoglobin: 14.7 g/dL (ref 13.0–17.0)
MCH: 29.4 pg (ref 26.0–34.0)
MCHC: 32.7 g/dL (ref 30.0–36.0)
MCV: 90 fL (ref 80.0–100.0)
Platelets: 163 10*3/uL (ref 150–400)
RBC: 5 MIL/uL (ref 4.22–5.81)
RDW: 14.2 % (ref 11.5–15.5)
WBC: 6.6 10*3/uL (ref 4.0–10.5)
nRBC: 0 % (ref 0.0–0.2)

## 2023-03-13 LAB — SARS CORONAVIRUS 2 BY RT PCR: SARS Coronavirus 2 by RT PCR: NEGATIVE

## 2023-03-13 LAB — HEPARIN LEVEL (UNFRACTIONATED): Heparin Unfractionated: 0.46 IU/mL (ref 0.30–0.70)

## 2023-03-13 SURGERY — LEFT HEART CATH AND CORONARY ANGIOGRAPHY
Anesthesia: Moderate Sedation | Laterality: Right

## 2023-03-13 MED ORDER — MIDAZOLAM HCL 2 MG/2ML IJ SOLN
INTRAMUSCULAR | Status: AC
  Filled 2023-03-13: qty 2

## 2023-03-13 MED ORDER — HEPARIN (PORCINE) IN NACL 1000-0.9 UT/500ML-% IV SOLN
INTRAVENOUS | Status: AC
  Filled 2023-03-13: qty 1000

## 2023-03-13 MED ORDER — MIDAZOLAM HCL 2 MG/2ML IJ SOLN
INTRAMUSCULAR | Status: DC | PRN
  Administered 2023-03-13: 1 mg via INTRAVENOUS

## 2023-03-13 MED ORDER — HYDRALAZINE HCL 20 MG/ML IJ SOLN: 10.0000 mg | INTRAMUSCULAR | Status: DC | PRN

## 2023-03-13 MED ORDER — SODIUM CHLORIDE 0.9% FLUSH: 3.0000 mL | INTRAVENOUS | Status: DC | PRN

## 2023-03-13 MED ORDER — ASPIRIN 81 MG PO CHEW: 81.0000 mg | CHEWABLE_TABLET | Freq: Every day | ORAL | Status: DC

## 2023-03-13 MED ORDER — HEPARIN SODIUM (PORCINE) 1000 UNIT/ML IJ SOLN
INTRAMUSCULAR | Status: DC | PRN
  Administered 2023-03-13: 4500 [IU] via INTRAVENOUS

## 2023-03-13 MED ORDER — FENTANYL CITRATE (PF) 100 MCG/2ML IJ SOLN
INTRAMUSCULAR | Status: DC | PRN
  Administered 2023-03-13: 25 ug via INTRAVENOUS

## 2023-03-13 MED ORDER — VERAPAMIL HCL 2.5 MG/ML IV SOLN
INTRAVENOUS | Status: AC
  Filled 2023-03-13: qty 2

## 2023-03-13 MED ORDER — SODIUM CHLORIDE 0.9 % WEIGHT BASED INFUSION: 1.0000 mL/kg/h | INTRAVENOUS | Status: DC

## 2023-03-13 MED ORDER — FENTANYL CITRATE (PF) 100 MCG/2ML IJ SOLN
INTRAMUSCULAR | Status: AC
  Filled 2023-03-13: qty 2

## 2023-03-13 MED ORDER — ONDANSETRON HCL 4 MG/2ML IJ SOLN: 4.0000 mg | Freq: Four times a day (QID) | INTRAMUSCULAR | Status: DC | PRN

## 2023-03-13 MED ORDER — ACETAMINOPHEN 325 MG PO TABS: 650.0000 mg | ORAL_TABLET | ORAL | Status: DC | PRN

## 2023-03-13 MED ORDER — ASPIRIN 81 MG PO CHEW
CHEWABLE_TABLET | ORAL | Status: AC
  Filled 2023-03-13: qty 1

## 2023-03-13 MED ORDER — HEPARIN SODIUM (PORCINE) 1000 UNIT/ML IJ SOLN
INTRAMUSCULAR | Status: AC
  Filled 2023-03-13: qty 10

## 2023-03-13 MED ORDER — ISOSORBIDE MONONITRATE ER 60 MG PO TB24
60.0000 mg | ORAL_TABLET | Freq: Every day | ORAL | Status: DC
  Filled 2023-03-13: qty 1

## 2023-03-13 MED ORDER — SODIUM CHLORIDE 0.9 % IV SOLN: 250.0000 mL | INTRAVENOUS | Status: DC | PRN

## 2023-03-13 MED ORDER — ISOSORBIDE MONONITRATE ER 30 MG PO TB24: 30.0000 mg | ORAL_TABLET | Freq: Every day | ORAL | Status: DC

## 2023-03-13 MED ORDER — IOHEXOL 300 MG/ML  SOLN
INTRAMUSCULAR | Status: DC | PRN
  Administered 2023-03-13: 96 mL

## 2023-03-13 MED ORDER — VERAPAMIL HCL 2.5 MG/ML IV SOLN
INTRAVENOUS | Status: DC | PRN
  Administered 2023-03-13: 2.5 mg via INTRA_ARTERIAL

## 2023-03-13 MED ORDER — SODIUM CHLORIDE 0.9% FLUSH: 3.0000 mL | Freq: Two times a day (BID) | INTRAVENOUS | Status: DC

## 2023-03-13 MED ORDER — LIDOCAINE HCL (PF) 1 % IJ SOLN
INTRAMUSCULAR | Status: DC | PRN
  Administered 2023-03-13: 2 mL

## 2023-03-13 MED ORDER — CLOPIDOGREL BISULFATE 75 MG PO TABS: 75.0000 mg | ORAL_TABLET | Freq: Every day | ORAL | Status: DC

## 2023-03-13 MED ORDER — HEPARIN (PORCINE) IN NACL 2000-0.9 UNIT/L-% IV SOLN
INTRAVENOUS | Status: DC | PRN
  Administered 2023-03-13: 1000 mL

## 2023-03-13 MED ORDER — LABETALOL HCL 5 MG/ML IV SOLN: 10.0000 mg | INTRAVENOUS | Status: DC | PRN

## 2023-03-13 MED ORDER — SODIUM CHLORIDE 0.9 % IV SOLN: INTRAVENOUS | Status: DC

## 2023-03-13 MED ORDER — ASPIRIN 81 MG PO CHEW: 81.0000 mg | CHEWABLE_TABLET | ORAL | Status: DC

## 2023-03-13 SURGICAL SUPPLY — 11 items
CATH 5FR JL3.5 JR4 ANG PIG MP (CATHETERS) IMPLANT
DEVICE RAD TR BAND REGULAR (VASCULAR PRODUCTS) IMPLANT
DRAPE BRACHIAL (DRAPES) IMPLANT
DRAPE FEMORAL ANGIO W/ POUCH (DRAPES) IMPLANT
GLIDESHEATH SLEND SS 6F .021 (SHEATH) IMPLANT
GUIDEWIRE INQWIRE 1.5J.035X260 (WIRE) IMPLANT
INQWIRE 1.5J .035X260CM (WIRE) ×1
PACK CARDIAC CATH (CUSTOM PROCEDURE TRAY) ×1 IMPLANT
PROTECTION STATION PRESSURIZED (MISCELLANEOUS) ×1
SET ATX-X65L (MISCELLANEOUS) IMPLANT
STATION PROTECTION PRESSURIZED (MISCELLANEOUS) IMPLANT

## 2023-03-13 NOTE — Consult Note (Cosign Needed Addendum)
[facility] CLINIC CARDIOLOGY CONSULT NOTE       Patient ID: Alex Brandt MRN: [id number] DOB/AGE: 11/05/1975 47 y.o.  Admit date: 03/12/2023 Referring Physician Alex Brandt Primary Physician   Primary Cardiologist Alex Brandt Reason for Consultation chest pain   HPI: Alex Brandt. Alex Brandt is a 47 year old male with a past medical history of CAD w/ hx inferior STEMI s/p PCI with DES to RCA (06/02/22), hx right knee arthroplasty, ongoing tobacco use who presented to [facility] ED 03/12/2023 with chest pain. Troponins negative 3, 4. EKG nonacute. Cardiology consulted for further assistance.  Interval History:  -feels ok, NPO awaiting LHC -chest pain about 4-5/10 constant on the left, not reproducible or associated with N/V or radiation presently -on heparin, nitropaste -no shortness of breath, heart racing   Social:  -mother passed away 4 weeks ago (hx CAD)  -patient currently smoking 1PPD    Review of systems complete and found to be negative unless listed above     Past Medical History:  Diagnosis Date   Angina at rest    Arthritis    Coronary artery disease 05/05/2021   a.) LHC 05/05/2021: EF 55-65%; normal LV function and LVEDP; 50% mLAD, 50% D1, 50% p-mRCA; med mgmt.   DJD (degenerative joint disease) of knee    Dyspnea    GERD (gastroesophageal reflux disease)    HLD (hyperlipidemia)    Hyperkalemia    Hypertension    Migraines    Valvular insufficiency 12/18/2014   a.) TTE 12/18/2014: EF >55%; mild PR, moderate MR/TR.    Past Surgical History:  Procedure Laterality Date   APPENDECTOMY     CARDIAC CATHETERIZATION     CHOLECYSTECTOMY     COLONOSCOPY     CORONARY/GRAFT ACUTE MI REVASCULARIZATION N/A 06/02/2022   Procedure: Coronary/Graft Acute MI Revascularization;  Surgeon: Alex Pea, MD;  Location: [facility] INVASIVE CV LAB;  Service: Cardiovascular;  Laterality: N/A;   ESOPHAGOGASTRODUODENOSCOPY     KNEE ARTHROSCOPY WITH MEDIAL MENISECTOMY Right 08/30/2021    Procedure: Right knee arthroscopic partial medial meniscectomy;  Surgeon: Alex Kell, MD;  Location: [facility] ORS;  Service: Orthopedics;  Laterality: Right;   KNEE SURGERY Right    meniscus repair in [place] and again at [facility]   LEFT HEART CATH AND CORONARY ANGIOGRAPHY Left 05/05/2021   Procedure: LEFT HEART CATH AND CORONARY ANGIOGRAPHY;  Surgeon: Alex Pea, MD;  Location: [facility] INVASIVE CV LAB;  Service: Cardiovascular;  Laterality: Left;   LEFT HEART CATH AND CORONARY ANGIOGRAPHY N/A 06/02/2022   Procedure: LEFT HEART CATH AND CORONARY ANGIOGRAPHY;  Surgeon: Alex Pea, MD;  Location: [facility] INVASIVE CV LAB;  Service: Cardiovascular;  Laterality: N/A;   PARTIAL KNEE ARTHROPLASTY Right 02/15/2022   Procedure: UNICOMPARTMENTAL KNEE;  Surgeon: Alex Flake, MD;  Location: [facility] ORS;  Service: Orthopedics;  Laterality: Right;    (Not in a hospital admission)  Social History   Socioeconomic History   Marital status: Married    Spouse name: Not on file   Number of children: Not on file   Years of education: Not on file   Highest education level: Not on file  Occupational History   Not on file  Tobacco Use   Smoking status: Every Day    Packs/day: 0.50    Years: 20.00    Additional pack years: 0.00    Total pack years: 10.00    Types: Cigarettes   Smokeless tobacco: Never  Vaping Use   Vaping Use: Never used  Substance  and Sexual Activity   Alcohol use: Not Currently   Drug use: Never   Sexual activity: Not on file  Other Topics Concern   Not on file  Social History Narrative   Not on file   Social Determinants of Health   Financial Resource Strain: Not on file  Food Insecurity: No Food Insecurity (12/26/2022)   Hunger Vital Sign    Worried About Running Out of Food in the Last Year: Never true    Ran Out of Food in the Last Year: Never true  Transportation Needs: No Transportation Needs (12/26/2022)   PRAPARE - Administrator, Civil Service  (Medical): No    Lack of Transportation (Non-Medical): No  Physical Activity: Not on file  Stress: Not on file  Social Connections: Not on file  Intimate Partner Violence: Not At Risk (12/26/2022)   Humiliation, Afraid, Rape, and Kick questionnaire    Fear of Current or Ex-Partner: No    Emotionally Abused: No    Physically Abused: No    Sexually Abused: No    Family History  Problem Relation Age of Onset   Heart attack Mother    Heart attack Father    Heart attack Brother       Intake/Output Summary (Last 24 hours) at 03/13/2023 0849 Last data filed at 03/12/2023 2301 Gross per 24 hour  Intake --  Output 950 ml  Net -950 ml    Vitals:   03/13/23 0530 03/13/23 0605 03/13/23 0630 03/13/23 0700  BP: (!) 97/55 (!) 90/52 100/68 106/79  Pulse: 66 60 72 63  Resp: 19 17 19 18   Temp:      TempSrc:      SpO2: 92% 95% 93% 95%  Weight:      Height:        PHYSICAL EXAM General: pleasant middle aged male , well nourished, in no acute distress. Sitting up in bed, ambulatory without assistance HEENT:  Normocephalic and atraumatic. Neck:  No JVD.  Lungs: Normal respiratory effort on room air. Decreased breath sounds. No wheezes, crackles, rhonchi.  Chest: nontender to palpation of anterior chest wall  Heart: HRRR . Normal S1 and S2 without gallops or murmurs.  Abdomen: Non-distended appearing.  Msk: Normal strength and tone for age. Extremities: Warm and well perfused. No clubbing, cyanosis. No peripheral edema.  Neuro: Alert and oriented X 3. Psych:  Answers questions appropriately.   Labs: Basic Metabolic Panel: Recent Labs    03/12/23 0459 03/13/23 0512  NA 138 137  K 4.3 3.9  CL 106 108  CO2 24 21*  GLUCOSE 119* 107*  BUN 15 15  CREATININE 1.35* 1.24  CALCIUM 9.0 7.9*   Liver Function Tests: Recent Labs    03/12/23 0459  AST 24  ALT 21  ALKPHOS 68  BILITOT 0.6  PROT 7.8  ALBUMIN 4.3   No results for input(s): "LIPASE", "AMYLASE" in the last 72  hours. CBC: Recent Labs    03/12/23 0459 03/13/23 0512  WBC 9.3 6.6  HGB 16.1 14.7  HCT 49.3 45.0  MCV 88.5 90.0  PLT 203 163   Cardiac Enzymes: Recent Labs    03/12/23 0820 03/12/23 1112 03/12/23 1406  TROPONINIHS 4 4 3    BNP: Recent Labs    03/12/23 0459  BNP 42.3   D-Dimer: No results for input(s): "DDIMER" in the last 72 hours. Hemoglobin A1C: No results for input(s): "HGBA1C" in the last 72 hours. Fasting Lipid Panel: Recent Labs  03/12/23 0820  CHOL 122  HDL 35*  LDLCALC 75  TRIG 62  CHOLHDL 3.5   Thyroid Function Tests: No results for input(s): "TSH", "T4TOTAL", "T3FREE", "THYROIDAB" in the last 72 hours.  Invalid input(s): "FREET3" Anemia Panel: No results for input(s): "VITAMINB12", "FOLATE", "FERRITIN", "TIBC", "IRON", "RETICCTPCT" in the last 72 hours.   Radiology: DG Chest 1 View  Result Date: 03/12/2023 CLINICAL DATA:  47 year old male with history of chest pain. EXAM: CHEST  1 VIEW COMPARISON:  Chest x-ray 06/27/2020. FINDINGS: Lung volumes are normal. Mild diffuse interstitial prominence and peribronchial cuffing, concerning for an acute bronchitis. No consolidative airspace disease. No pleural effusions. No pneumothorax. No pulmonary nodule or mass noted. Pulmonary vasculature and the cardiomediastinal silhouette are within normal limits. IMPRESSION: 1. Findings concerning for possible acute bronchitis, as above. Electronically Signed   By: Trudie Reed M.D.   On: 03/12/2023 06:01    ECHO 08/2022 NORMAL LEFT VENTRICULAR SYSTOLIC FUNCTION WITH AN ESTIMATED EF = 50-55 %  NORMAL RIGHT VENTRICULAR SYSTOLIC FUNCTION  MILD-TO-MODERATE MITRAL VALVE INSUFFICIENCY  MILD TRICUSPID VALVE INSUFFICIENCY  NO VALVULAR STENOSIS  _________________________________________________________________________________________  Electronically signed by    Dorothyann Peng, MD on 08/18/2022 03: 44 PM           Performed By: Mathis Bud, RVT     Ordering  Physician: Harl Favor    LHC - 06/02/22   Mid LAD lesion is 50% stenosed.   Prox RCA to Mid RCA lesion is 50% stenosed.   1st Diag lesion is 50% stenosed.   Mid RCA lesion is 100% stenosed.   A drug-eluting stent was successfully placed using a STENT ONYX FRONTIER 2.5X18.   Post intervention, there is a 0% residual stenosis.   Post intervention, there is a 0% residual stenosis.   The left ventricular systolic function is normal.   LV end diastolic pressure is normal.   The left ventricular ejection fraction is 55-65% by visual estimate.   STEMI inferior wall Emergent left heart cath right groin Minor irregularities in the left coronary system 100% mid RCA IRA Successful PCI and stent with DES 2.5 x 18 mm [place] Onyx to 16 atm lesion was reduced from 100% down to 0% TIMI-3 flow was restored from TIMI 0 flow Angiomax continue to reduce rate for an additional 2 hours To be maintained on aspirin and Brilinta for 12 months Patient tolerated procedure well no complications minx deployed in right femoral artery Patient to ICU anticipate discharge 24 to 48 hours  TELEMETRY reviewed by me (LT) 03/13/2023 : NSR 60-80s  EKG reviewed by me: NSR 73 without ischemic changes or TWI   Data reviewed by me (LT) 03/13/2023: last cardiology clinic note, ED note, admission H&P last 24h vitals tele labs imaging I/O    Principal Problem:   Chest pain Active Problems:   Coronary artery disease   Hypertension   Nicotine dependence   HLD (hyperlipidemia)   Chronic diastolic CHF (congestive heart failure) (HCC)    ASSESSMENT AND PLAN:  Joesph Fillers. Alex Brandt is a 47 year old male with a past medical history of CAD w/ hx inferior STEMI s/p PCI with DES to RCA (06/02/22), hx right knee arthroplasty, ongoing tobacco use who presented to [facility] ED 03/12/2023 with chest pain. Troponins negative 3, 4. EKG nonacute. Cardiology consulted for further assistance.  # unstable angina # CAD w/ hx inferior STEMI  06/02/22 Presents with 2 weeks of intermittent chest pain, worsened somewhat with activity, refractory to home imdur, and acutely  worsening to 9/10 the day prior to admission + nausea and radiation from L chest to L elbow. Improved to 4-5/10 with nitropaste and heparin. Nonreproducible to palpation. Fortunately, enzymes negative at 3, 4 and EKG without acute ischemic changes. However, due to his known underlying CAD and symptoms concerning for unstable angina, LHC is reccommended for further evaluation.  - s/p 325mg  aspirin. Will continue 75mg  plavix daily (taken off aspirin 09/2022 d/t significant bruising with DAPT)  - continue heparin gtt until LHC - continue crestor 40mg  daily (LDL 75) - continue nitropaste PRN while inpatient - consider increase of home Imdur from 30mg  daily to 60mg  daily - continue ranexa 500mg  BID  - addition of BB, ARB limited by low/normal BP - echo complete performed this AM, pending read - plan for LHC with Dr. Dorothyann Peng this afternoon at 1pm. Risks and benefits discussed with the patient and he is agreeable to proceed. NPO until LHC. Written consent with be obtained.   # mild-mod MR  By echo in 08/2022. LVEF low normal 50-55%. Euvolemic on exam.  -repeat echo pending read as above  # tobacco abuse Currently smoked 1PPD, encouraged complete cessation. Motivated to quit. Using nicotine patches.   This patient's plan of care was discussed and created with Dr. Darrold Junker and he is in agreement.  Signed: Rebeca Allegra , PA-C 03/13/2023, 8:49 AM [facility] Cardiology

## 2023-03-13 NOTE — CV Procedure (Signed)
Brief post cath note Indication unstable angina known coronary disease  Patient brought to the cardiac Cath Lab inpatient for unstable angina 6 French sheath placed in the right radial artery under ultrasound guidance after 5 cc of 1% lidocaine to anesthetize the area  First floated a 5 [place] 4 curved right distal cath into the left ventricle to obtain left ventricular pressures We did left ventriculogram using the right coronary catheter showed mildly reduced left ventricular function EF around 45 to 50  Switched out for 5 [place] 3.5  curved left Judkins catheter obtain a left coronary system  Short left main large LAD 50% lesion at the bifurcation with diagonal which is large Circumflex large relatively free of disease RCA large with diffuse 25% proximal to mid with a mid widely patent stent TIMI-3 flow right dominant system  After reviewing the films I elected not to proceed with intervening in the mid LAD as it looks similar to his previous cath back in August.  Recommend GDMT Imdur Plavix Ranexa low-dose metoprolol statin  Recommend medical therapy follow-up as an outpatient  Consider outpatient cardiac CTA with FFR versus stress cardiac MRI  Full cath note to follow [facility]

## 2023-03-13 NOTE — Progress Notes (Signed)
ANTICOAGULATION CONSULT NOTE  Pharmacy Consult for heparin infusion Indication: chest pain/ACS  Allergies  Allergen Reactions   Shellfish Allergy Anaphylaxis and Swelling   Hydrocodone Nausea And Vomiting   No [employer] Food Allergies Swelling    The pt is allergic to tea ex. Like sweet tea to drink   Toradol [Ketorolac Tromethamine] Other (See Comments)    Patient states he cannot take it with one of his heart medications   Tramadol Other (See Comments)    Migraines   Tramadol Hcl Other (See Comments)    Patient Measurements: Height: 6\' 3"  (190.5 cm) Weight: 90.7 kg (200 lb) IBW/kg (Calculated) : 84.5 Heparin Dosing Weight: 90.7 kg  Vital Signs: Temp: 98.2 F (36.8 C) (06/02 1826) Temp Source: Oral (06/02 1826) BP: 90/52 (06/03 0605) Pulse Rate: 60 (06/03 0605)  Labs: Recent Labs    03/12/23 0459 03/12/23 0651 03/12/23 0820 03/12/23 1112 03/12/23 1406 03/12/23 1541 03/12/23 2259 03/13/23 0512  HGB 16.1  --   --   --   --   --   --  14.7  HCT 49.3  --   --   --   --   --   --  45.0  PLT 203  --   --   --   --   --   --  163  APTT  --   --  32  --   --   --   --   --   LABPROT  --   --  13.4  --   --   --   --   --   INR  --   --  1.0  --   --   --   --   --   HEPARINUNFRC  --   --   --   --   --  0.25* 0.40 0.46  CREATININE 1.35*  --   --   --   --   --   --  1.24  TROPONINIHS 5   < > 4 4 3   --   --   --    < > = values in this interval not displayed.     Estimated Creatinine Clearance: 89.9 mL/min (by C-G formula based on SCr of 1.24 mg/dL).   Medical History: Past Medical History:  Diagnosis Date   Angina at rest    Arthritis    Coronary artery disease 05/05/2021   a.) LHC 05/05/2021: EF 55-65%; normal LV function and LVEDP; 50% mLAD, 50% D1, 50% p-mRCA; med mgmt.   DJD (degenerative joint disease) of knee    Dyspnea    GERD (gastroesophageal reflux disease)    HLD (hyperlipidemia)    Hyperkalemia    Hypertension    Migraines    Valvular  insufficiency 12/18/2014   a.) TTE 12/18/2014: EF >55%; mild PR, moderate MR/TR.    Assessment: 47 y.o. male history of CAD status post stents in August presents to the ER for evaluation of chest pain. According to my review of PMH there is no chronic anticoagulation present prior to arrival  Baseline labs completed, normal values  6/2 1541  HL= 0.25 Subtherapeutic, 1250 > 1450 u/hr 6/2 2259 HL 0.40, therapeutic x 1 6/3 0512 HL 0.46, therapeutic x 2  Goal of Therapy:  Heparin level 0.3-0.7 units/ml Monitor platelets by anticoagulation protocol: Yes   Plan:  Continue heparin infusion at 1450 units/hr Recheck HL w/ AM labs to confirm Planning cardiac cath in am 6/3 Continue  to monitor H&H and platelets  Otelia Sergeant, PharmD, [facility] 03/13/2023 6:11 AM

## 2023-03-13 NOTE — ED Notes (Signed)
Report given to cath lab and patient transported to cath lab

## 2023-03-13 NOTE — Progress Notes (Signed)
*  PRELIMINARY RESULTS* Echocardiogram 2D Echocardiogram has been performed.  Cristela Blue 03/13/2023, 10:26 AM

## 2023-03-13 NOTE — ED Notes (Signed)
Pt ambulatory to bathroom

## 2023-03-13 NOTE — Progress Notes (Signed)
PROGRESS NOTE    Alex MERSHON  Brandt:[id number] DOB: 31-Aug-1976 DOA: 03/12/2023 PCP: [facility]  Outpatient Specialists: cardiology    Brief Narrative:   IZMAEL Brandt is a 47 y.o. male with medical history significant of CAD, STEMI, s/p of stent 05/2022, HTN, HLD, dCHf, GERD, tobacco abuse, who presents with chest pain.   Patient states that he has intermittent chest pain for more than 2 weeks, which has worsened since last night, waking him up from sleep.  His chest pain is located in middle and the right side of chest, constant, initially 8 out of sensitivity, currently 5 out of 10 in severity, pressure-like, radiating to left arm and shoulder, not alleviated or aggravated by any known factors.  Not associated with shortness of breath or cough.  No fever or chills.  No recent long distance traveling.  Patient has nausea, no vomiting, diarrhea or abdominal pain.  No symptoms of UTI.  No recent fall or rectal bleeding.  Patient states that he took Imdur without improvement at home.     Assessment & Plan:   Principal Problem:   Chest pain Active Problems:   Coronary artery disease   Hypertension   HLD (hyperlipidemia)   Chronic diastolic CHF (congestive heart failure) (HCC)   Nicotine dependence  # Chest pain # CAD Hx STEMI 05/2022 with PCI and stent to RCA. Now with intermittent non-exertional chest pain/pressure for 2 weeks, concern for unstable angina. No signs heart failure, no denies respiratory symptoms. Currently chest pain free. Received aspirin. - continue heparin - LHC today - continue nitrate, plavix, statin - TTE pending  # HTN Here bp wnl - monitor  # HFpEF Compensated - holding home lasix  # Tobacco abuse - patch     DVT prophylaxis: IV heparin Code Status: full Family Communication: wife updated @ bedside 6/3  Level of care: Telemetry Cardiac Status is: Observation    Consultants:   cardiology  Procedures: Left heart cath pending  Antimicrobials:  none    Subjective: No chest pain, feeling usual state of health  Objective: Vitals:   03/13/23 0530 03/13/23 0605 03/13/23 0630 03/13/23 0700  BP: (!) 97/55 (!) 90/52 100/68 106/79  Pulse: 66 60 72 63  Resp: 19 17 19 18   Temp:      TempSrc:      SpO2: 92% 95% 93% 95%  Weight:      Height:        Intake/Output Summary (Last 24 hours) at 03/13/2023 0820 Last data filed at 03/12/2023 2301 Gross per 24 hour  Intake --  Output 950 ml  Net -950 ml   Filed Weights   03/12/23 0452  Weight: 90.7 kg    Examination:  General exam: Appears calm and comfortable  Respiratory system: Clear to auscultation. Respiratory effort normal. Cardiovascular system: S1 & S2 heard, RRR. No JVD, murmurs, rubs, gallops or clicks. No pedal edema. Gastrointestinal system: Abdomen is nondistended, soft and nontender. No organomegaly or masses felt. Normal bowel sounds heard. Central nervous system: Alert and oriented. No focal neurological deficits. Extremities: Symmetric 5 x 5 power. Skin: No rashes, lesions or ulcers Psychiatry: Judgement and insight appear normal. Mood & affect appropriate.     Data Reviewed: I have personally reviewed following labs and imaging studies  CBC: Recent Labs  Lab 03/12/23 0459 03/13/23 0512  WBC 9.3 6.6  HGB 16.1 14.7  HCT 49.3 45.0  MCV 88.5 90.0  PLT 203 163   Basic  Metabolic Panel: Recent Labs  Lab 03/12/23 0459 03/13/23 0512  NA 138 137  K 4.3 3.9  CL 106 108  CO2 24 21*  GLUCOSE 119* 107*  BUN 15 15  CREATININE 1.35* 1.24  CALCIUM 9.0 7.9*   GFR: Estimated Creatinine Clearance: 89.9 mL/min (by C-G formula based on SCr of 1.24 mg/dL). Liver Function Tests: Recent Labs  Lab 03/12/23 0459  AST 24  ALT 21  ALKPHOS 68  BILITOT 0.6  PROT 7.8  ALBUMIN 4.3   No results for input(s): "LIPASE", "AMYLASE" in the last 168 hours. No results for input(s): "AMMONIA" in  the last 168 hours. Coagulation Profile: Recent Labs  Lab 03/12/23 0820  INR 1.0   Cardiac Enzymes: No results for input(s): "CKTOTAL", "CKMB", "CKMBINDEX", "TROPONINI" in the last 168 hours. BNP (last 3 results) No results for input(s): "PROBNP" in the last 8760 hours. HbA1C: No results for input(s): "HGBA1C" in the last 72 hours. CBG: No results for input(s): "GLUCAP" in the last 168 hours. Lipid Profile: Recent Labs    03/12/23 0820  CHOL 122  HDL 35*  LDLCALC 75  TRIG 62  CHOLHDL 3.5   Thyroid Function Tests: No results for input(s): "TSH", "T4TOTAL", "FREET4", "T3FREE", "THYROIDAB" in the last 72 hours. Anemia Panel: No results for input(s): "VITAMINB12", "FOLATE", "FERRITIN", "TIBC", "IRON", "RETICCTPCT" in the last 72 hours. Urine analysis:    Component Value Date/Time   COLORURINE YELLOW (A) 02/01/2022 1054   APPEARANCEUR CLEAR (A) 02/01/2022 1054   LABSPEC 1.026 02/01/2022 1054   PHURINE 5.0 02/01/2022 1054   GLUCOSEU NEGATIVE 02/01/2022 1054   HGBUR NEGATIVE 02/01/2022 1054   BILIRUBINUR NEGATIVE 02/01/2022 1054   KETONESUR NEGATIVE 02/01/2022 1054   PROTEINUR NEGATIVE 02/01/2022 1054   NITRITE NEGATIVE 02/01/2022 1054   LEUKOCYTESUR NEGATIVE 02/01/2022 1054   Sepsis Labs: @LABRCNTIP (procalcitonin:4,lacticidven:4)  )No results found for this or any previous visit (from the past 240 hour(s)).       Radiology Studies: DG Chest 1 View  Result Date: 03/12/2023 CLINICAL DATA:  47 year old male with history of chest pain. EXAM: CHEST  1 VIEW COMPARISON:  Chest x-ray 06/27/2020. FINDINGS: Lung volumes are normal. Mild diffuse interstitial prominence and peribronchial cuffing, concerning for an acute bronchitis. No consolidative airspace disease. No pleural effusions. No pneumothorax. No pulmonary nodule or mass noted. Pulmonary vasculature and the cardiomediastinal silhouette are within normal limits. IMPRESSION: 1. Findings concerning for possible acute  bronchitis, as above. Electronically Signed   By: Trudie Reed M.D.   On: 03/12/2023 06:01        Scheduled Meds:  acetaminophen  650 mg Oral Once   clopidogrel  75 mg Oral Daily   nicotine  21 mg Transdermal Daily   nitroGLYCERIN  1 inch Topical Q6H   pantoprazole  40 mg Oral Daily   ranolazine  500 mg Oral BID   rosuvastatin  40 mg Oral QHS   Continuous Infusions:  sodium chloride 125 mL/hr at 03/12/23 2301   heparin 1,450 Units/hr (03/12/23 2310)     LOS: 0 days     Silvano Bilis, MD [facility]   If 7PM-7AM, please contact night-coverage [email or web address] Password [email or web address] 03/13/2023, 8:20 AM

## 2023-03-14 LAB — CARDIAC CATHETERIZATION: Cath EF Quantitative: 45 %

## 2023-03-14 LAB — HEMOGLOBIN A1C
Hgb A1c MFr Bld: 5.7 % — ABNORMAL HIGH (ref 4.8–5.6)
Mean Plasma Glucose: 117 mg/dL

## 2023-03-14 NOTE — Discharge Summary (Addendum)
Alex Brandt:[id number] DOB: 11-13-1975 DOA: 03/12/2023  PCP: [facility]  Admit date: 03/12/2023 Discharge date: 03/14/2023  Recommendations for Outpatient Follow-up:  Cardiology to contact to arrange f/u     Discharge Diagnoses:  Principal Problem:   Chest pain Active Problems:   Coronary artery disease   Hypertension   HLD (hyperlipidemia)   Chronic diastolic CHF (congestive heart failure) (HCC)   Nicotine dependence   Discharge Condition: unknown  Diet recommendation: heart healthy  Filed Weights   03/12/23 0452  Weight: 90.7 kg    History of present illness:  From admission h and p  Alex Brandt is a 47 y.o. male with medical history significant of CAD, STEMI, s/p of stent 05/2022, HTN, HLD, dCHf, GERD, tobacco abuse, who presents with chest pain.   Patient states that he has intermittent chest pain for more than 2 weeks, which has worsened since last night, waking him up from sleep.  His chest pain is located in middle and the right side of chest, constant, initially 8 out of sensitivity, currently 5 out of 10 in severity, pressure-like, radiating to left arm and shoulder, not alleviated or aggravated by any known factors.  Not associated with shortness of breath or cough.  No fever or chills.  No recent long distance traveling.  Patient has nausea, no vomiting, diarrhea or abdominal pain.  No symptoms of UTI.  No recent fall or rectal bleeding.  Patient states that he took Imdur without improvement at home.  Hospital Course:   Patient with hx STEMI 05/2022 with PCI and stent to the RCA presenting with non-exertional chest pain/pressure for 2 weeks, normal troponins, concerning for unstable angina. Started on heparin GTT and cardiology consulted. Went for [facility] on hospital day 1 which showed mildly reduced ef 45-50, stable disease to the LAD and RCA, patent RCA stent. Plan was GDMT. Cardiology mistakenly placed a discharge order after the  procedure and the patient was discharged before this was discovered. Cardiology (Dr. Juliann Pares and his team) will be responsible for contacting the patient, adjusting medications, and arranging follow-up.   Procedures: LHC   Consultations: cardiology  Discharge Exam: Vitals:   03/13/23 1530 03/13/23 1554  BP: 118/64 103/68  Pulse: (!) 54 65  Resp: 17 17  Temp:    SpO2: 97% 98%    Exam: see progress note from 6/3  Discharge Instructions    Allergies as of 03/13/2023       Reactions   Shellfish Allergy Anaphylaxis, Swelling   Hydrocodone Nausea And Vomiting   No Healthtouch Food Allergies Swelling   The pt is allergic to tea ex. Like sweet tea to drink   Toradol [ketorolac Tromethamine] Other (See Comments)   Patient states he cannot take it with one of his heart medications   Tramadol Other (See Comments)   Migraines   Tramadol Hcl Other (See Comments)        Medication List     ASK your doctor about these medications    acetaminophen 325 MG tablet Commonly known as: TYLENOL Take 2 tablets (650 mg total) by mouth every 6 (six) hours as needed for mild pain, fever or headache (or Fever >/= 101).   clopidogrel 75 MG tablet Commonly known as: PLAVIX Take 75 mg by mouth daily.   cyclobenzaprine 5 MG tablet Commonly known as: FLEXERIL Take 1 tablet (5 mg total) by mouth 3 (three) times daily as needed for muscle spasms. This can make you sleepy.  furosemide 20 MG tablet Commonly known as: LASIX Take 20 mg by mouth daily as needed for fluid.   isosorbide mononitrate 30 MG 24 hr tablet Commonly known as: IMDUR Take 30 mg by mouth daily.   nitroGLYCERIN 0.4 MG SL tablet Commonly known as: NITROSTAT Place 0.4 mg under the tongue every 5 (five) minutes as needed for chest pain.   pantoprazole 40 MG tablet Commonly known as: PROTONIX Take 40 mg by mouth daily.   predniSONE 10 MG tablet Commonly known as: DELTASONE Take 4 tablets once a day for 3 days  starting tomorrow, 3 tablets once a day for 3 days, 2 tablets for 3 days and then 1 tablet once a day for 3 days   rosuvastatin 40 MG tablet Commonly known as: CRESTOR Take 40 mg by mouth at bedtime.       Allergies  Allergen Reactions   Shellfish Allergy Anaphylaxis and Swelling   Hydrocodone Nausea And Vomiting   No Healthtouch Food Allergies Swelling    The pt is allergic to tea ex. Like sweet tea to drink   Toradol [Ketorolac Tromethamine] Other (See Comments)    Patient states he cannot take it with one of his heart medications   Tramadol Other (See Comments)    Migraines   Tramadol Hcl Other (See Comments)    Follow-up Information     Alwyn Pea, MD. Go on 03/21/2023.   Specialties: Cardiology, Internal Medicine Why: at 0845 Contact information: [address] [place] [address] [phone]                  The results of significant diagnostics from this hospitalization (including imaging, microbiology, ancillary and laboratory) are listed below for reference.    Significant Diagnostic Studies: CARDIAC CATHETERIZATION  Result Date: 03/14/2023   Mid LAD lesion is 50% stenosed.   1st Diag lesion is 50% stenosed.   Prox RCA lesion is 25% stenosed.   Non-stenotic Prox RCA to Mid RCA lesion was previously treated.   Non-stenotic Mid RCA lesion was previously treated.   There is mild left ventricular systolic dysfunction.   LV end diastolic pressure is normal.   There is no mitral valve regurgitation.   ECHOCARDIOGRAM COMPLETE  Result Date: 03/13/2023    ECHOCARDIOGRAM REPORT   Patient Name:   Alex Brandt Date of Exam: 03/13/2023 Medical Rec #:  [id number]          Height:       75.0 in Accession #:    [id number]         Weight:       200.0 lb Date of Birth:  January 18, 1976          BSA:          2.194 m Patient Age:    47 years           BP:           106/79 mmHg Patient Gender: M                  HR:           63 bpm. Exam Location:  [facility]  Procedure: 2D Echo, Cardiac Doppler and Color Doppler Indications:     Chest pain R07.9  History:         Patient has prior history of Echocardiogram examinations, most                  recent 06/02/2022. Signs/Symptoms:Dyspnea; Risk  Factors:Hypertension.  Sonographer:     Cristela Blue Referring Phys:  [id number] Lorretta Harp Diagnosing Phys: Marcina Millard MD  Sonographer Comments: Image acquisition challenging due to patient body habitus. IMPRESSIONS  1. Left ventricular ejection fraction, by estimation, is 55 to 60%. The left ventricle has normal function. The left ventricle has no regional wall motion abnormalities. Left ventricular diastolic parameters are indeterminate.  2. Right ventricular systolic function is normal. The right ventricular size is normal.  3. The mitral valve is normal in structure. Mild mitral valve regurgitation. No evidence of mitral stenosis.  4. The aortic valve is normal in structure. Aortic valve regurgitation is not visualized. No aortic stenosis is present.  5. The inferior vena cava is normal in size with greater than 50% respiratory variability, suggesting right atrial pressure of 3 mmHg. FINDINGS  Left Ventricle: Left ventricular ejection fraction, by estimation, is 55 to 60%. The left ventricle has normal function. The left ventricle has no regional wall motion abnormalities. The left ventricular internal cavity size was normal in size. There is  no left ventricular hypertrophy. Left ventricular diastolic parameters are indeterminate. Right Ventricle: The right ventricular size is normal. No increase in right ventricular wall thickness. Right ventricular systolic function is normal. Left Atrium: Left atrial size was normal in size. Right Atrium: Right atrial size was normal in size. Pericardium: There is no evidence of pericardial effusion. Mitral Valve: The mitral valve is normal in structure. Mild mitral valve regurgitation. No evidence of mitral valve stenosis.  Tricuspid Valve: The tricuspid valve is normal in structure. Tricuspid valve regurgitation is mild . No evidence of tricuspid stenosis. Aortic Valve: The aortic valve is normal in structure. Aortic valve regurgitation is not visualized. No aortic stenosis is present. Aortic valve mean gradient measures 2.5 mmHg. Aortic valve peak gradient measures 4.4 mmHg. Aortic valve area, by VTI measures 3.55 cm. Pulmonic Valve: The pulmonic valve was normal in structure. Pulmonic valve regurgitation is not visualized. No evidence of pulmonic stenosis. Aorta: The aortic root is normal in size and structure. Venous: The inferior vena cava is normal in size with greater than 50% respiratory variability, suggesting right atrial pressure of 3 mmHg. IAS/Shunts: No atrial level shunt detected by color flow Doppler.  LEFT VENTRICLE PLAX 2D LVIDd:         4.90 cm   Diastology LVIDs:         3.50 cm   LV e' medial:  12.90 cm/s LV PW:         1.00 cm   LV e' lateral: 11.50 cm/s LV IVS:        1.10 cm LVOT diam:     2.20 cm LV SV:         64 LV SV Index:   29 LVOT Area:     3.80 cm  RIGHT VENTRICLE RV Basal diam:  4.10 cm RV Mid diam:    3.50 cm LEFT ATRIUM           Index        RIGHT ATRIUM           Index LA diam:      2.30 cm 1.05 cm/m   RA Area:     18.40 cm LA Vol (A2C): 34.4 ml 15.68 ml/m  RA Volume:   57.90 ml  26.39 ml/m LA Vol (A4C): 29.7 ml 13.53 ml/m  AORTIC VALVE AV Area (Vmax):    2.71 cm AV Area (Vmean):   2.80 cm AV Area (VTI):  3.55 cm AV Vmax:           105.15 cm/s AV Vmean:          68.750 cm/s AV VTI:            0.180 m AV Peak Grad:      4.4 mmHg AV Mean Grad:      2.5 mmHg LVOT Vmax:         75.00 cm/s LVOT Vmean:        50.700 cm/s LVOT VTI:          0.168 m LVOT/AV VTI ratio: 0.93  AORTA Ao Root diam: 3.10 cm  SHUNTS Systemic VTI:  0.17 m Systemic Diam: 2.20 cm Marcina Millard MD Electronically signed by Marcina Millard MD Signature Date/Time: 03/13/2023/4:44:42 PM    Final    DG Chest 1  View  Result Date: 03/12/2023 CLINICAL DATA:  47 year old male with history of chest pain. EXAM: CHEST  1 VIEW COMPARISON:  Chest x-ray 06/27/2020. FINDINGS: Lung volumes are normal. Mild diffuse interstitial prominence and peribronchial cuffing, concerning for an acute bronchitis. No consolidative airspace disease. No pleural effusions. No pneumothorax. No pulmonary nodule or mass noted. Pulmonary vasculature and the cardiomediastinal silhouette are within normal limits. IMPRESSION: 1. Findings concerning for possible acute bronchitis, as above. Electronically Signed   By: Trudie Reed M.D.   On: 03/12/2023 06:01    Microbiology: Recent Results (from the past 240 hour(s))  SARS Coronavirus 2 by RT PCR (hospital order, performed in [facility] hospital lab) *cepheid single result test* Anterior Nasal Swab     Status: None   Collection Time: 03/13/23  9:00 AM   Specimen: Anterior Nasal Swab  Result Value Ref Range Status   SARS Coronavirus 2 by RT PCR NEGATIVE NEGATIVE Final    Comment: (NOTE) SARS-CoV-2 target nucleic acids are NOT DETECTED.  The SARS-CoV-2 RNA is generally detectable in upper and lower respiratory specimens during the acute phase of infection. The lowest concentration of SARS-CoV-2 viral copies this assay can detect is 250 copies / mL. A negative result does not preclude SARS-CoV-2 infection and should not be used as the sole basis for treatment or other patient management decisions.  A negative result may occur with improper specimen collection / handling, submission of specimen other than nasopharyngeal swab, presence of viral mutation(s) within the areas targeted by this assay, and inadequate number of viral copies (<250 copies / mL). A negative result must be combined with clinical observations, patient history, and epidemiological information.  Fact Sheet for Patients:   RoadLapTop.co.za  Fact Sheet for Healthcare  Providers: http://kim-miller.com/  This test is not yet approved or  cleared by the [place] FDA and has been authorized for detection and/or diagnosis of SARS-CoV-2 by FDA under an Emergency Use Authorization (EUA).  This EUA will remain in effect (meaning this test can be used) for the duration of the COVID-19 declaration under Section 564(b)(1) of the Act, 21 U.S.C. section 360bbb-3(b)(1), unless the authorization is terminated or revoked sooner.  Performed at [facility], [address] [place], [place] [address]      Labs: Basic Metabolic Panel: Recent Labs  Lab 03/12/23 0459 03/13/23 0512  NA 138 137  K 4.3 3.9  CL 106 108  CO2 24 21*  GLUCOSE 119* 107*  BUN 15 15  CREATININE 1.35* 1.24  CALCIUM 9.0 7.9*   Liver Function Tests: Recent Labs  Lab 03/12/23 0459  AST 24  ALT 21  ALKPHOS  68  BILITOT 0.6  PROT 7.8  ALBUMIN 4.3   No results for input(s): "LIPASE", "AMYLASE" in the last 168 hours. No results for input(s): "AMMONIA" in the last 168 hours. CBC: Recent Labs  Lab 03/12/23 0459 03/13/23 0512  WBC 9.3 6.6  HGB 16.1 14.7  HCT 49.3 45.0  MCV 88.5 90.0  PLT 203 163   Cardiac Enzymes: No results for input(s): "CKTOTAL", "CKMB", "CKMBINDEX", "TROPONINI" in the last 168 hours. BNP: BNP (last 3 results) Recent Labs    08/08/22 1530 03/12/23 0459  BNP 70.0 42.3    ProBNP (last 3 results) No results for input(s): "PROBNP" in the last 8760 hours.  CBG: No results for input(s): "GLUCAP" in the last 168 hours.     Signed:  Silvano Bilis MD.  [facility] 03/14/2023, 11:19 AM

## 2023-03-22 ENCOUNTER — Encounter: Payer: Self-pay | Admitting: Internal Medicine

## 2023-06-07 ENCOUNTER — Emergency Department: Payer: Medicaid Other

## 2023-06-07 ENCOUNTER — Other Ambulatory Visit: Payer: Self-pay

## 2023-06-07 ENCOUNTER — Observation Stay
Admission: EM | Admit: 2023-06-07 | Discharge: 2023-06-08 | Disposition: A | Discharge status: Home / Self Care | Payer: Medicaid Other | Attending: Internal Medicine | Admitting: Internal Medicine

## 2023-06-07 DIAGNOSIS — I2089 Other forms of angina pectoris: Principal | ICD-10-CM

## 2023-06-07 DIAGNOSIS — Z7982 Long term (current) use of aspirin: Secondary | ICD-10-CM | POA: Insufficient documentation

## 2023-06-07 DIAGNOSIS — N179 Acute kidney failure, unspecified: Secondary | ICD-10-CM | POA: Diagnosis not present

## 2023-06-07 DIAGNOSIS — I10 Essential (primary) hypertension: Secondary | ICD-10-CM | POA: Diagnosis present

## 2023-06-07 DIAGNOSIS — F17210 Nicotine dependence, cigarettes, uncomplicated: Secondary | ICD-10-CM | POA: Insufficient documentation

## 2023-06-07 DIAGNOSIS — Z79899 Other long term (current) drug therapy: Secondary | ICD-10-CM | POA: Diagnosis not present

## 2023-06-07 DIAGNOSIS — R079 Chest pain, unspecified: Secondary | ICD-10-CM | POA: Diagnosis present

## 2023-06-07 DIAGNOSIS — I2510 Atherosclerotic heart disease of native coronary artery without angina pectoris: Secondary | ICD-10-CM | POA: Diagnosis present

## 2023-06-07 DIAGNOSIS — K219 Gastro-esophageal reflux disease without esophagitis: Secondary | ICD-10-CM | POA: Diagnosis not present

## 2023-06-07 DIAGNOSIS — I5032 Chronic diastolic (congestive) heart failure: Secondary | ICD-10-CM | POA: Diagnosis not present

## 2023-06-07 DIAGNOSIS — R072 Precordial pain: Secondary | ICD-10-CM

## 2023-06-07 DIAGNOSIS — I25112 Atherosclerotic heart disease of native coronary artery with refractory angina pectoris: Secondary | ICD-10-CM | POA: Diagnosis not present

## 2023-06-07 DIAGNOSIS — F17200 Nicotine dependence, unspecified, uncomplicated: Secondary | ICD-10-CM | POA: Diagnosis present

## 2023-06-07 DIAGNOSIS — R0789 Other chest pain: Secondary | ICD-10-CM

## 2023-06-07 DIAGNOSIS — I110 Hypertensive heart disease with heart failure: Secondary | ICD-10-CM | POA: Diagnosis not present

## 2023-06-07 LAB — CBC WITH DIFFERENTIAL/PLATELET
Abs Immature Granulocytes: 0.02 K/uL (ref 0.00–0.07)
Basophils Absolute: 0.1 K/uL (ref 0.0–0.1)
Basophils Relative: 1 %
Eosinophils Absolute: 0.1 K/uL (ref 0.0–0.5)
Eosinophils Relative: 1 %
HCT: 46.1 % (ref 39.0–52.0)
Hemoglobin: 15.9 g/dL (ref 13.0–17.0)
Immature Granulocytes: 0 %
Lymphocytes Relative: 20 %
Lymphs Abs: 1.7 K/uL (ref 0.7–4.0)
MCH: 29.9 pg (ref 26.0–34.0)
MCHC: 34.5 g/dL (ref 30.0–36.0)
MCV: 86.8 fL (ref 80.0–100.0)
Monocytes Absolute: 0.4 K/uL (ref 0.1–1.0)
Monocytes Relative: 5 %
Neutro Abs: 6.3 K/uL (ref 1.7–7.7)
Neutrophils Relative %: 73 %
Platelets: 202 K/uL (ref 150–400)
RBC: 5.31 MIL/uL (ref 4.22–5.81)
RDW: 14 % (ref 11.5–15.5)
WBC: 8.6 K/uL (ref 4.0–10.5)
nRBC: 0 % (ref 0.0–0.2)

## 2023-06-07 LAB — COMPREHENSIVE METABOLIC PANEL
ALT: 24 U/L (ref 0–44)
AST: 20 U/L (ref 15–41)
Albumin: 3.9 g/dL (ref 3.5–5.0)
Alkaline Phosphatase: 69 U/L (ref 38–126)
Anion gap: 8 (ref 5–15)
BUN: 17 mg/dL (ref 6–20)
CO2: 22 mmol/L (ref 22–32)
Calcium: 8.7 mg/dL — ABNORMAL LOW (ref 8.9–10.3)
Chloride: 107 mmol/L (ref 98–111)
Creatinine, Ser: 1.27 mg/dL — ABNORMAL HIGH (ref 0.61–1.24)
GFR, Estimated: >60 mL/min (ref >60)
Glucose, Bld: 102 mg/dL — ABNORMAL HIGH (ref 70–99)
Potassium: 3.7 mmol/L (ref 3.5–5.1)
Sodium: 137 mmol/L (ref 135–145)
Total Bilirubin: 0.5 mg/dL (ref 0.3–1.2)
Total Protein: 7.2 g/dL (ref 6.5–8.1)

## 2023-06-07 LAB — TYPE AND SCREEN
ABO/RH(D): O POS
Antibody Screen: NEGATIVE

## 2023-06-07 LAB — LIPASE, BLOOD: Lipase: 39 U/L (ref 11–51)

## 2023-06-07 LAB — TROPONIN I (HIGH SENSITIVITY)
Troponin I (High Sensitivity): 4 ng/L (ref <18)
Troponin I (High Sensitivity): 4 ng/L

## 2023-06-07 LAB — BRAIN NATRIURETIC PEPTIDE: B Natriuretic Peptide: 49.4 pg/mL (ref 0.0–100.0)

## 2023-06-07 MED ORDER — SUCRALFATE 1 G PO TABS
1.0000 g | ORAL_TABLET | Freq: Three times a day (TID) | ORAL | Status: DC
  Administered 2023-06-07 – 2023-06-08 (×3): 1 g via ORAL
  Filled 2023-06-07 (×3): qty 1

## 2023-06-07 MED ORDER — HEPARIN SODIUM (PORCINE) 5000 UNIT/ML IJ SOLN
5000.0000 [IU] | Freq: Three times a day (TID) | INTRAMUSCULAR | Status: DC
  Filled 2023-06-07 (×2): qty 1

## 2023-06-07 MED ORDER — ONDANSETRON HCL 4 MG/2ML IJ SOLN: 4.0000 mg | Freq: Four times a day (QID) | INTRAMUSCULAR | Status: DC | PRN

## 2023-06-07 MED ORDER — SODIUM CHLORIDE 0.9 % IV BOLUS
1000.0000 mL | Freq: Once | INTRAVENOUS | Status: AC
  Administered 2023-06-07: 1000 mL via INTRAVENOUS

## 2023-06-07 MED ORDER — SODIUM CHLORIDE 0.9 % IV BOLUS
500.0000 mL | Freq: Once | INTRAVENOUS | Status: AC
  Administered 2023-06-07: 500 mL via INTRAVENOUS

## 2023-06-07 MED ORDER — PANTOPRAZOLE 80MG IVPB - SIMPLE MED
80.0000 mg | Freq: Once | INTRAVENOUS | Status: AC
  Administered 2023-06-07: 80 mg via INTRAVENOUS
  Filled 2023-06-07: qty 100

## 2023-06-07 MED ORDER — ACETAMINOPHEN 325 MG PO TABS
650.0000 mg | ORAL_TABLET | Freq: Four times a day (QID) | ORAL | Status: DC | PRN
  Administered 2023-06-08: 650 mg via ORAL
  Filled 2023-06-07 (×2): qty 2

## 2023-06-07 MED ORDER — IOHEXOL 350 MG/ML SOLN
100.0000 mL | Freq: Once | INTRAVENOUS | Status: AC | PRN
  Administered 2023-06-07: 100 mL via INTRAVENOUS

## 2023-06-07 MED ORDER — ASPIRIN 81 MG PO TBEC: 81.0000 mg | DELAYED_RELEASE_TABLET | Freq: Every day | ORAL | Status: DC

## 2023-06-07 MED ORDER — LACTATED RINGERS IV SOLN: INTRAVENOUS | Status: DC

## 2023-06-07 MED ORDER — CLOPIDOGREL BISULFATE 75 MG PO TABS
75.0000 mg | ORAL_TABLET | Freq: Every day | ORAL | Status: DC
  Administered 2023-06-08: 75 mg via ORAL
  Filled 2023-06-07: qty 1

## 2023-06-07 MED ORDER — NITROGLYCERIN 2 % TD OINT
1.0000 [in_us] | TOPICAL_OINTMENT | Freq: Once | TRANSDERMAL | Status: AC
  Administered 2023-06-07: 1 [in_us] via TOPICAL
  Filled 2023-06-07: qty 1

## 2023-06-07 MED ORDER — ACETAMINOPHEN 325 MG PO TABS
650.0000 mg | ORAL_TABLET | ORAL | Status: DC | PRN
Start: 2023-06-07 — End: 2023-06-07

## 2023-06-07 MED ORDER — ALUM & MAG HYDROXIDE-SIMETH 200-200-20 MG/5ML PO SUSP
30.0000 mL | Freq: Once | ORAL | Status: AC
  Administered 2023-06-07: 30 mL via ORAL
  Filled 2023-06-07: qty 30

## 2023-06-07 MED ORDER — LIDOCAINE VISCOUS HCL 2 % MT SOLN
15.0000 mL | Freq: Once | OROMUCOSAL | Status: AC
  Administered 2023-06-07: 15 mL via OROMUCOSAL
  Filled 2023-06-07: qty 15

## 2023-06-07 MED ORDER — ROSUVASTATIN CALCIUM 10 MG PO TABS
40.0000 mg | ORAL_TABLET | Freq: Every day | ORAL | Status: DC
  Administered 2023-06-07: 40 mg via ORAL
  Filled 2023-06-07: qty 2
  Filled 2023-06-07: qty 4

## 2023-06-07 NOTE — ED Notes (Signed)
nitroglycerin paste removed.

## 2023-06-07 NOTE — ED Triage Notes (Signed)
Pt to ED via [facility] from home for c/o left-sided chest pain that radiates to the neck for 2-3 days. Denies nausea. Pt took 324mg  aspirin prior to EMS arrival. 12 lead unremarkable. Hx MI w/stent placement one year ago.

## 2023-06-07 NOTE — H&P (Signed)
History and Physical    Patient: Alex Brandt WGN:[id number] DOB: Jul 25, 1976 DOA: 06/07/2023 DOS: the patient was seen and examined on 06/08/2023 PCP: [facility]  Patient coming from: Home   Chief Complaint:  Chief Complaint  Patient presents with   Chest Pain    HPI: Alex Brandt is a 47 y.o. male with medical history significant for PMHx STEMI, CAD, HTN, HLD, HFpEF here with chest pain and SOB. Pt reports that for the past 2-3 days. Chest pain ongoing since stent in August of last year.  Pt states he is having chest pain. Duration:3 days and worse today. Frequency: intermittent  Location:left precordium  Quality: sharp pain  Rate:9/10  Radiation: LA and left side neck and ear.  Aggravating: nothing Alleviating:resting and nothing else no NTG.  Associated factors:  chills/ diaphoretic earlier today.  Pt has felt nausea but did not vomit. Pt used to take BC/ goody and took it last  about 1 year when he started antiplatelet for his heart attack.s Pt smokes 1/2 ppd.  Colonoscopy : Is due had one but has one scheduled. Pt has had one in high school as well because of  bleeding ulcer and got appendix and gall bladder took out in high school. Pt was never a heavy drinker and does not drink currently.   Review of Systems: Review of Systems  Cardiovascular:  Positive for chest pain.  All other systems reviewed and are negative.  Past Medical History:  Diagnosis Date   Angina at rest    Arthritis    Coronary artery disease 05/05/2021   a.) LHC 05/05/2021: EF 55-65%; normal LV function and LVEDP; 50% mLAD, 50% D1, 50% p-mRCA; med mgmt.   DJD (degenerative joint disease) of knee    Dyspnea    GERD (gastroesophageal reflux disease)    HLD (hyperlipidemia)    Hyperkalemia    Hypertension    Migraines    Valvular insufficiency 12/18/2014   a.) TTE 12/18/2014: EF >55%; mild PR, moderate MR/TR.   Past Surgical History:  Procedure Laterality  Date   APPENDECTOMY     CARDIAC CATHETERIZATION     CHOLECYSTECTOMY     COLONOSCOPY     CORONARY/GRAFT ACUTE MI REVASCULARIZATION N/A 06/02/2022   Procedure: Coronary/Graft Acute MI Revascularization;  Surgeon: Alwyn Pea, MD;  Location: [facility] INV[facility]IVE [facility];  Service: Cardiovascular;  Laterality: N/A;   ESOPHAGOGASTRODUODENOSCOPY     KNEE ARTHROSCOPY WITH MEDIAL MENISECTOMY Right 08/30/2021   Procedure: Right knee arthroscopic partial medial meniscectomy;  Surgeon: Signa Kell, MD;  Location: [facility] ORS;  Service: Orthopedics;  Laterality: Right;   KNEE SURGERY Right    meniscus repair in [place] and again at [facility]   LEFT HEART CATH AND CORONARY ANGIOGRAPHY Left 05/05/2021   Procedure: LEFT HEART CATH AND CORONARY ANGIOGRAPHY;  Surgeon: Alwyn Pea, MD;  Location: [facility];  Service: Cardiovascular;  Laterality: Left;   LEFT HEART CATH AND CORONARY ANGIOGRAPHY N/A 06/02/2022   Procedure: LEFT HEART CATH AND CORONARY ANGIOGRAPHY;  Surgeon: Alwyn Pea, MD;  Location: [facility];  Service: Cardiovascular;  Laterality: N/A;   LEFT HEART CATH AND CORONARY ANGIOGRAPHY Right 03/13/2023   Procedure: LEFT HEART CATH AND CORONARY ANGIOGRAPHY with possible intervention;  Surgeon: Alwyn Pea, MD;  Location: [facility];  Service: Cardiovascular;  Laterality: Right;   PARTIAL KNEE ARTHROPLASTY Right 02/15/2022   Procedure: UNICOMPARTMENTAL KNEE;  Surgeon: Christena Flake, MD;  Location: [facility] OR[facility];  Service: Orthopedics;  Laterality: Right;   Social History:   reports that he has been smoking cigarettes. He has a 10 pack-year smoking history. He has never used smokeless tobacco. He reports that he does not currently use alcohol. He reports that he does not use drugs.  Allergies  Allergen Reactions   Shellfish Allergy Anaphylaxis and Swelling   Hydrocodone Nausea And Vomiting   No [employer] Food Allergies Swelling    The pt is allergic to tea  ex. Like sweet tea to drink   Toradol [Ketorolac Tromethamine] Other (See Comments)    Patient states he cannot take it with one of his heart medications   Tramadol Other (See Comments)    Migraines   Tramadol Hcl Other (See Comments)    Family History  Problem Relation Age of Onset   Heart attack Mother    Heart attack Father    Heart attack Brother     Prior to Admission medications   Medication Sig Start Date End Date Taking? Authorizing Provider  acetaminophen (TYLENOL) 325 MG tablet Take 2 tablets (650 mg total) by mouth every 6 (six) hours as needed for mild pain, fever or headache (or Fever >/= 101). 12/27/22   Loyce Dys, MD  clopidogrel (PLAVIX) 75 MG tablet Take 75 mg by mouth daily.    [provider]  cyclobenzaprine (FLEXERIL) 5 MG tablet Take 1 tablet (5 mg total) by mouth 3 (three) times daily as needed for muscle spasms. This can make you sleepy. Patient not taking: Reported on 03/12/2023 01/05/23   Drake Leach, PA-C  furosemide (LASIX) 20 MG tablet Take 20 mg by mouth daily as needed for fluid. Patient not taking: Reported on 03/12/2023 08/08/22   [provider]  isosorbide mononitrate (IMDUR) 30 MG 24 hr tablet Take 30 mg by mouth daily.    [provider]  nitroGLYCERIN (NITROSTAT) 0.4 MG SL tablet Place 0.4 mg under the tongue every 5 (five) minutes as needed for chest pain. 04/22/21   [provider]  pantoprazole (PROTONIX) 40 MG tablet Take 40 mg by mouth daily.    [provider]  predniSONE (DELTASONE) 10 MG tablet Take 4 tablets once a day for 3 days starting tomorrow, 3 tablets once a day for 3 days, 2 tablets for 3 days and then 1 tablet once a day for 3 days Patient not taking: Reported on 03/12/2023 12/25/22   Tommi Rumps, PA-C  rosuvastatin (CRESTOR) 40 MG tablet Take 40 mg by mouth at bedtime.    [provider]   Vitals:   06/07/23 2037 06/07/23 2130 06/07/23 2204 06/08/23 0201  BP:  126/83 108/60  108/67  Pulse:  64 63 68  Resp:  17 18 18   Temp: 98.2 F (36.8 C)  98 F (36.7 C) 97.8 F (36.6 C)  TempSrc: Oral   Oral  SpO2:  99% 99% 96%  Weight:      Height:       Physical Exam Vitals and nursing note reviewed.  Constitutional:      General: He is not in acute distress. HENT:     Head: Normocephalic and atraumatic.      Right Ear: Hearing normal.     Left Ear: Hearing normal.     Nose: Nose normal. No nasal deformity.     Mouth/Throat:     Lips: Pink.     Tongue: No lesions.     Pharynx: Oropharynx is clear.  Eyes:  General: Lids are normal.     Extraocular Movements: Extraocular movements intact.     Pupils: Pupils are equal, round, and reactive to light.  Cardiovascular:     Rate and Rhythm: Normal rate and regular rhythm.     Heart sounds: Normal heart sounds.  Pulmonary:     Effort: Pulmonary effort is normal.     Breath sounds: Normal breath sounds.  Chest:    Abdominal:     General: Bowel sounds are normal. There is no distension.     Palpations: Abdomen is soft. There is no mass.     Tenderness: There is no abdominal tenderness.  Musculoskeletal:     Right lower leg: No edema.     Left lower leg: No edema.  Skin:    General: Skin is warm.     Comments: Skin cancer on left hand   Neurological:     General: No focal deficit present.     Mental Status: He is alert and oriented to person, place, and time.     Cranial Nerves: Cranial nerves 2-12 are intact.  Psychiatric:        Attention and Perception: Attention normal.        Mood and Affect: Mood normal.        Speech: Speech normal.        Behavior: Behavior normal. Behavior is cooperative.     Labs on Admission: I have personally reviewed following labs and imaging studies  CBC: Recent Labs  Lab 06/07/23 1750  WBC 8.6  NEUTROABS 6.3  HGB 15.9  HCT 46.1  MCV 86.8  PLT 202   Basic Metabolic Panel: Recent Labs  Lab 06/07/23 1750  NA 137  K 3.7  CL 107  CO2 22  GLUCOSE 102*   BUN 17  CREATININE 1.27*  CALCIUM 8.7*   GFR: Estimated Creatinine Clearance: 95.7 mL/min (A) (by C-G formula based on SCr of 1.27 mg/dL (H)). Liver Function Tests: Recent Labs  Lab 06/07/23 1750  AST 20  ALT 24  ALKPHOS 69  BILITOT 0.5  PROT 7.2  ALBUMIN 3.9   Recent Labs  Lab 06/07/23 1918  LIPASE 39   No results for input(s): "AMMONIA" in the last 168 hours. Coagulation Profile: No results for input(s): "INR", "PROTIME" in the last 168 hours. Cardiac Enzymes: No results for input(s): "CKTOTAL", "CKMB", "CKMBINDEX", "TROPONINI" in the last 168 hours. BNP (last 3 results) No results for input(s): "PROBNP" in the last 8760 hours. HbA1C: No results for input(s): "HGBA1C" in the last 72 hours. CBG: No results for input(s): "GLUCAP" in the last 168 hours. Lipid Profile: No results for input(s): "CHOL", "HDL", "LDLCALC", "TRIG", "CHOLHDL", "LDLDIRECT" in the last 72 hours. Thyroid Function Tests: No results for input(s): "TSH", "T4TOTAL", "FREET4", "T3FREE", "THYROIDAB" in the last 72 hours. Anemia Panel: No results for input(s): "VITAMINB12", "FOLATE", "FERRITIN", "TIBC", "IRON", "RETICCTPCT" in the last 72 hours. Urinalysis    Component Value Date/Time   COLORURINE YELLOW (A) 02/01/2022 1054   APPEARANCEUR CLEAR (A) 02/01/2022 1054   LABSPEC 1.026 02/01/2022 1054   PHURINE 5.0 02/01/2022 1054   GLUCOSEU NEGATIVE 02/01/2022 1054   HGBUR NEGATIVE 02/01/2022 1054   BILIRUBINUR NEGATIVE 02/01/2022 1054   KETONESUR NEGATIVE 02/01/2022 1054   PROTEINUR NEGATIVE 02/01/2022 1054   NITRITE NEGATIVE 02/01/2022 1054   LEUKOCYTESUR NEGATIVE 02/01/2022 1054    Medications  sucralfate (CARAFATE) tablet 1 g (1 g Oral Given 06/07/23 2129)  acetaminophen (TYLENOL) tablet 650 mg (has no administration in  time range)  clopidogrel (PLAVIX) tablet 75 mg (has no administration in time range)  rosuvastatin (CRESTOR) tablet 40 mg (40 mg Oral Given 06/07/23 2318)  ondansetron  (ZOFRAN) injection 4 mg (has no administration in time range)  heparin injection 5,000 Units (5,000 Units Subcutaneous Patient Refused/Not Given 06/07/23 2200)  lactated ringers infusion ( Intravenous New Bag/Given 06/07/23 2305)  aspirin EC tablet 81 mg (has no administration in time range)  pantoprazole (PROTONIX) EC tablet 80 mg (has no administration in time range)  sodium chloride 0.9 % bolus 1,000 mL (0 mLs Intravenous Stopped 06/07/23 1907)  sodium chloride 0.9 % bolus 500 mL (0 mLs Intravenous Stopped 06/07/23 1947)  alum & mag hydroxide-simeth (MAALOX/MYLANTA) 200-200-20 MG/5ML suspension 30 mL (30 mLs Oral Given 06/07/23 1845)  lidocaine (XYLOCAINE) 2 % viscous mouth solution 15 mL (15 mLs Mouth/Throat Given 06/07/23 1845)  iohexol (OMNIPAQUE) 350 MG/ML injection 100 mL (100 mLs Intravenous Contrast Given 06/07/23 1853)  nitroGLYCERIN (NITROGLYN) 2 % ointment 1 inch (1 inch Topical Given 06/07/23 2024)  pantoprazole (PROTONIX) 80 mg /NS 100 mL IVPB (80 mg Intravenous New Bag/Given 06/07/23 2316)    Radiological Exams on Admission: CT Angio Chest/Abd/Pel for Dissection W and/or Wo Contrast  Result Date: 06/07/2023 CLINICAL DATA:  Aortic syndrome suspected.  Left chest pain. EXAM: CT ANGIOGRAPHY CHEST, ABDOMEN AND PELVIS TECHNIQUE: Non-contrast CT of the chest was initially obtained. Multidetector CT imaging through the chest, abdomen and pelvis was performed using the standard protocol during bolus administration of intravenous contrast. Multiplanar reconstructed images and MIPs were obtained and reviewed to evaluate the vascular anatomy. RADIATION DOSE REDUCTION: This exam was performed according to the departmental dose-optimization program which includes automated exposure control, adjustment of the mA and/or kV according to patient size and/or use of iterative reconstruction technique. CONTRAST:  OMNIPAQUE IOHEXOL 350 MG/ML SOLN COMPARISON:  CT abdomen and pelvis 12/15/2022 FINDINGS: CTA  CHEST FINDINGS Cardiovascular: Preferential opacification of the thoracic aorta. No evidence of thoracic aortic aneurysm or dissection. Normal heart size. No pericardial effusion. Mediastinum/Nodes: No enlarged mediastinal, hilar, or axillary lymph nodes. Thyroid gland, trachea, and esophagus demonstrate no significant findings. Lungs/Pleura: Mild emphysematous changes are present. Lungs are otherwise clear. No pleural effusion or pneumothorax. There is a calcified granuloma in the right lower lobe. Musculoskeletal: No chest wall abnormality. No acute or significant osseous findings. Review of the MIP images confirms the above findings. CTA ABDOMEN AND PELVIS FINDINGS VASCULAR Aorta: Normal caliber aorta without aneurysm, dissection, vasculitis or significant stenosis. There is mild calcified atherosclerotic disease throughout the aorta. Celiac: Patent without evidence of aneurysm, dissection, vasculitis or significant stenosis. SMA: Patent without evidence of aneurysm, dissection, vasculitis or significant stenosis. Renals: Both renal arteries are patent without evidence of aneurysm, dissection, vasculitis, fibromuscular dysplasia or significant stenosis. IMA: Patent without evidence of aneurysm, dissection, vasculitis or significant stenosis. Inflow: There is focal 50% stenosis in the mid left common iliac artery secondary to noncalcified plaque. There is no evidence for aneurysm or dissection. There is moderate calcified atherosclerotic disease in the common iliac arteries. Veins: No obvious venous abnormality within the limitations of this arterial phase study. Review of the MIP images confirms the above findings. NON-VASCULAR Hepatobiliary: No focal liver abnormality is seen. Status post cholecystectomy. No biliary dilatation. Pancreas: Unremarkable. No pancreatic ductal dilatation or surrounding inflammatory changes. Spleen: Normal in size without focal abnormality. Adrenals/Urinary Tract: Adrenal glands are  unremarkable. Kidneys are normal, without renal calculi, focal lesion, or hydronephrosis. Bladder is unremarkable. Stomach/Bowel: Stomach is  within normal limits. Appendix is not visualized. No evidence of bowel wall thickening, distention, or inflammatory changes. There are scattered colonic diverticula. Lymphatic: No enlarged lymph nodes are seen. Reproductive: Prostate is unremarkable. Other: No abdominal wall hernia or abnormality. No abdominopelvic ascites. Musculoskeletal: No acute or significant osseous findings. Review of the MIP images confirms the above findings. IMPRESSION: 1. No evidence for aortic dissection or aneurysm. 2. Focal 50% stenosis in the mid left common iliac artery secondary to noncalcified plaque. 3. No acute localizing process in the chest, abdomen or pelvis. 4. Colonic diverticulosis without evidence for diverticulitis. Aortic Atherosclerosis (ICD10-I70.0) and Emphysema (ICD10-J43.9). Electronically Signed   By: Darliss Cheney M.D.   On: 06/07/2023 19:28   DG Chest 2 View  Result Date: 06/07/2023 CLINICAL DATA:  Left-sided chest pain radiating to neck for 2-3 days, history of coronary artery disease EXAM: CHEST - 2 VIEW COMPARISON:  03/12/2023 FINDINGS: Frontal and lateral views of the chest demonstrate an unremarkable cardiac silhouette. No acute airspace disease, effusion, or pneumothorax. Background emphysema again noted. No acute bony abnormalities. IMPRESSION: 1. No acute intrathoracic process. Electronically Signed   By: Sharlet Salina M.D.   On: 06/07/2023 18:27     Data Reviewed: Relevant notes from primary care and specialist visits, past discharge summaries as available in EHR, including Care Everywhere. Prior diagnostic testing as pertinent to current admission diagnoses Updated medications and problem lists for reconciliation ED course, including vitals, labs, imaging, treatment and response to treatment Triage notes, nursing and pharmacy notes and ED provider's  notes Notable results as noted in HPI  Assessment and Plan: * Chest pain Patient presenting with left-sided chest pain that has been radiating to his left arm and left side of the jaw. Patient states that he has really been having this since his cath last year intermittently but has been worse since the past 3 days.  He has been just writing out the pain and has not used any nitroglycerin or NSAIDs.  Patient was troponins are negative today discussed with the patient that this may actually also be GI related as he has a history of bleeding ulcers in the past.  However has he has continued to smoke and also has been using omeprazole likelihood of stent restenosis is also present and we will contact cardiology to consult for best testing and evaluation in his situation and scenario.  Patient verbalized understanding will admit to telemetry or PCU unit and monitor.  Patient follows with  [facility].  Patient strongly advised to stop smoking.  Coronary artery disease Troponins negative,EKG shows sinus rhythm 76 with a PR of 138 QTc 406, nonspecific ST changes in  inferior lateral leads.  Will continue patient on aspirin and statin along with DVT prophylaxis.  Patient took his Plavix today morning.  Cardiology consulted.  Chronic diastolic CHF (congestive heart failure) (HCC) Currently stable patient is euvolemic.  As needed diuresis as needed only.  Hypertension Vitals:   06/07/23 1719 06/07/23 1810 06/07/23 1830 06/07/23 1906  BP: 111/67 93/62 104/61 118/66   06/07/23 1930 06/07/23 2015 06/07/23 2130 06/07/23 2204  BP: 99/62 121/75 126/83 108/60   06/08/23 0201  BP: 108/67  Blood pressures have been soft.  Therefore home medication regimen with Imdur and Lasix have been held.  A.m. team to reassess and restart as deemed appropriate.   Nicotine dependence Strongly advised to quit smoking as it directly affects and indirectly causes multiple complications. Verbalized understanding.  GERD  (gastroesophageal reflux disease) IV PPI,  Carafate started. Close monitoring. Type and screen. GI consult as deemed appropriate inpatient versus outpatient depending on patient's stay and outcome.  AKI (acute kidney injury) [facility]) Lab Results  Component Value Date   CREATININE 1.27 (H) 06/07/2023   CREATININE 1.24 03/13/2023   CREATININE 1.35 (H) 03/12/2023  Mild AKI 1.27 creatinine, we will follow. Currently due to Low BP meds are held maybe that may results in improvement.  In the meantime we will avoid contrast and renally dose needed medications.    DVT prophylaxis:  Heparin   Consults:  Cardiology: [facility].  Advance Care Planning:    Code Status: Full Code   Family Communication:  None   Disposition Plan:  Home   Severity of Illness: The appropriate patient status for this patient is OBSERVATION. Observation status is judged to be reasonable and necessary in order to provide the required intensity of service to ensure the patient's safety. The patient's presenting symptoms, physical exam findings, and initial radiographic and laboratory data in the context of their medical condition is felt to place them at decreased risk for further clinical deterioration. Furthermore, it is anticipated that the patient will be medically stable for discharge from the hospital within 2 midnights of admission.   Author: Gertha Calkin, MD 06/08/2023 3:22 AM  For on call review [email or web address].

## 2023-06-07 NOTE — ED Notes (Signed)
ED TO INPATIENT HANDOFF REPORT  ED Nurse Name and Phone #: Darryll Capers   S Name/Age/Gender Tommye Standard 47 y.o. male Room/Bed: ED18A/ED18A  Code Status   Code Status: Full Code  Home/SNF/Other Home Patient oriented to: self, place, time, and situation Is this baseline? Yes   Triage Complete: Triage complete  Chief Complaint Chest pain [R07.9]  Triage Note Pt to ED via [facility] from home for c/o left-sided chest pain that radiates to the neck for 2-3 days. Denies nausea. Pt took 324mg  aspirin prior to EMS arrival. 12 lead unremarkable. Hx MI w/stent placement one year ago.   Allergies Allergies  Allergen Reactions   Shellfish Allergy Anaphylaxis and Swelling   Hydrocodone Nausea And Vomiting   No [employer] Food Allergies Swelling    The pt is allergic to tea ex. Like sweet tea to drink   Toradol [Ketorolac Tromethamine] Other (See Comments)    Patient states he cannot take it with one of his heart medications   Tramadol Other (See Comments)    Migraines   Tramadol Hcl Other (See Comments)    Level of Care/Admitting Diagnosis ED Disposition     ED Disposition  Admit   Condition  --   Comment  Hospital Area: [facility] [[id number]]  Level of Care: Progressive [102]  Admit to Progressive based on following criteria: CARDIOVASCULAR & THORACIC of moderate stability with acute coronary syndrome symptoms/low risk myocardial infarction/hypertensive urgency/arrhythmias/heart failure potentially compromising stability and stable post cardiovascular intervention patients.  Admit to Progressive based on following criteria: GI, ENDOCRINE disease patients with GI bleeding, acute liver failure or pancreatitis, stable with diabetic ketoacidosis or thyrotoxicosis (hypothyroid) state.  Covid Evaluation: Asymptomatic - no recent exposure (last 10 days) testing not required  Diagnosis: Chest pain [[id number]]  Admitting Physician: Darrold Junker   Attending Physician: Darrold Junker          B Medical/Surgery History Past Medical History:  Diagnosis Date   Angina at rest    Arthritis    Coronary artery disease 05/05/2021   a.) LHC 05/05/2021: EF 55-65%; normal LV function and LVEDP; 50% mLAD, 50% D1, 50% p-mRCA; med mgmt.   DJD (degenerative joint disease) of knee    Dyspnea    GERD (gastroesophageal reflux disease)    HLD (hyperlipidemia)    Hyperkalemia    Hypertension    Migraines    Valvular insufficiency 12/18/2014   a.) TTE 12/18/2014: EF >55%; mild PR, moderate MR/TR.   Past Surgical History:  Procedure Laterality Date   APPENDECTOMY     CARDIAC CATHETERIZATION     CHOLECYSTECTOMY     COLONOSCOPY     CORONARY/GRAFT ACUTE MI REVASCULARIZATION N/A 06/02/2022   Procedure: Coronary/Graft Acute MI Revascularization;  Surgeon: Alwyn Pea, MD;  Location: [facility];  Service: Cardiovascular;  Laterality: N/A;   ESOPHAGOGASTRODUODENOSCOPY     KNEE ARTHROSCOPY WITH MEDIAL MENISECTOMY Right 08/30/2021   Procedure: Right knee arthroscopic partial medial meniscectomy;  Surgeon: Signa Kell, MD;  Location: [facility] ORS;  Service: Orthopedics;  Laterality: Right;   KNEE SURGERY Right    meniscus repair in [place] and again at [facility]   LEFT HEART CATH AND CORONARY ANGIOGRAPHY Left 05/05/2021   Procedure: LEFT HEART CATH AND CORONARY ANGIOGRAPHY;  Surgeon: Alwyn Pea, MD;  Location: [facility];  Service: Cardiovascular;  Laterality: Left;   LEFT HEART CATH AND CORONARY ANGIOGRAPHY N/A 06/02/2022   Procedure: LEFT HEART CATH  AND CORONARY ANGIOGRAPHY;  Surgeon: Alwyn Pea, MD;  Location: [facility];  Service: Cardiovascular;  Laterality: N/A;   LEFT HEART CATH AND CORONARY ANGIOGRAPHY Right 03/13/2023   Procedure: LEFT HEART CATH AND CORONARY ANGIOGRAPHY with possible intervention;  Surgeon: Alwyn Pea, MD;  Location: [facility];  Service:  Cardiovascular;  Laterality: Right;   PARTIAL KNEE ARTHROPLASTY Right 02/15/2022   Procedure: UNICOMPARTMENTAL KNEE;  Surgeon: Christena Flake, MD;  Location: [facility] ORS;  Service: Orthopedics;  Laterality: Right;     A IV Location/Drains/Wounds Patient Lines/Drains/Airways Status     Active Line/Drains/Airways     Name Placement date Placement time Site Days   Peripheral IV 06/07/23 20 G Anterior;Right Forearm 06/07/23  1749  Forearm  less than 1   Peripheral IV 06/07/23 20 G 1" Posterior;Right Hand 06/07/23  1919  Hand  less than 1            Intake/Output Last 24 hours  Intake/Output Summary (Last 24 hours) at 06/07/2023 2134 Last data filed at 06/07/2023 1947 Gross per 24 hour  Intake 1499 ml  Output --  Net 1499 ml    Labs/Imaging Results for orders placed or performed during the hospital encounter of 06/07/23 (from the past 48 hour(s))  CBC with Differential     Status: None   Collection Time: 06/07/23  5:50 PM  Result Value Ref Range   WBC 8.6 4.0 - 10.5 K/uL   RBC 5.31 4.22 - 5.81 MIL/uL   Hemoglobin 15.9 13.0 - 17.0 g/dL   HCT 40.9 81.1 - 91.4 %   MCV 86.8 80.0 - 100.0 fL   MCH 29.9 26.0 - 34.0 pg   MCHC 34.5 30.0 - 36.0 g/dL   RDW 78.2 95.6 - 21.3 %   Platelets 202 150 - 400 K/uL   nRBC 0.0 0.0 - 0.2 %   Neutrophils Relative % 73 %   Neutro Abs 6.3 1.7 - 7.7 K/uL   Lymphocytes Relative 20 %   Lymphs Abs 1.7 0.7 - 4.0 K/uL   Monocytes Relative 5 %   Monocytes Absolute 0.4 0.1 - 1.0 K/uL   Eosinophils Relative 1 %   Eosinophils Absolute 0.1 0.0 - 0.5 K/uL   Basophils Relative 1 %   Basophils Absolute 0.1 0.0 - 0.1 K/uL   Immature Granulocytes 0 %   Abs Immature Granulocytes 0.02 0.00 - 0.07 K/uL    Comment: Performed at [facility], [address] [place], [place] [address]  Comprehensive metabolic panel     Status: Abnormal   Collection Time: 06/07/23  5:50 PM  Result Value Ref Range   Sodium 137 135 - 145 mmol/L   Potassium 3.7 3.5 - 5.1  mmol/L   Chloride 107 98 - 111 mmol/L   CO2 22 22 - 32 mmol/L   Glucose, Bld 102 (H) 70 - 99 mg/dL    Comment: Glucose reference range applies only to samples taken after fasting for at least 8 hours.   BUN 17 6 - 20 mg/dL   Creatinine, Ser 8.46 (H) 0.61 - 1.24 mg/dL   Calcium 8.7 (L) 8.9 - 10.3 mg/dL   Total Protein 7.2 6.5 - 8.1 g/dL   Albumin 3.9 3.5 - 5.0 g/dL   AST 20 15 - 41 U/L   ALT 24 0 - 44 U/L   Alkaline Phosphatase 69 38 - 126 U/L   Total Bilirubin 0.5 0.3 - 1.2 mg/dL   GFR, Estimated >96 >29  mL/min    Comment: (NOTE) Calculated using the CKD-EPI Creatinine Equation (2021)    Anion gap 8 5 - 15    Comment: Performed at [facility], [address] [place], [place] [address]  Troponin I (High Sensitivity)     Status: None   Collection Time: 06/07/23  5:50 PM  Result Value Ref Range   Troponin I (High Sensitivity) 4 <18 ng/L    Comment: (NOTE) Elevated high sensitivity troponin I (hsTnI) values and significant  changes across serial measurements may suggest ACS but many other  chronic and acute conditions are known to elevate hsTnI results.  Refer to the "Links" section for chest pain algorithms and additional  guidance. Performed at [facility], [address] [place], [place] [address]   Brain natriuretic peptide     Status: None   Collection Time: 06/07/23  5:50 PM  Result Value Ref Range   B Natriuretic Peptide 49.4 0.0 - 100.0 pg/mL    Comment: Performed at [facility], [address] [place], [place] [address]  Lipase, blood     Status: None   Collection Time: 06/07/23  7:18 PM  Result Value Ref Range   Lipase 39 11 - 51 U/L    Comment: Performed at [facility], [address] [place], [place] [address]  Troponin I (High Sensitivity)     Status: None   Collection Time: 06/07/23  7:18 PM  Result Value Ref Range   Troponin I (High Sensitivity) 4 <18 ng/L    Comment: (NOTE) Elevated high sensitivity troponin I  (hsTnI) values and significant  changes across serial measurements may suggest ACS but many other  chronic and acute conditions are known to elevate hsTnI results.  Refer to the "Links" section for chest pain algorithms and additional  guidance. Performed at [facility], [address], [place], [place] [address]    CT Angio Chest/Abd/Pel for Dissection W and/or Wo Contrast  Result Date: 06/07/2023 CLINICAL DATA:  Aortic syndrome suspected.  Left chest pain. EXAM: CT ANGIOGRAPHY CHEST, ABDOMEN AND PELVIS TECHNIQUE: Non-contrast CT of the chest was initially obtained. Multidetector CT imaging through the chest, abdomen and pelvis was performed using the standard protocol during bolus administration of intravenous contrast. Multiplanar reconstructed images and MIPs were obtained and reviewed to evaluate the vascular anatomy. RADIATION DOSE REDUCTION: This exam was performed according to the departmental dose-optimization program which includes automated exposure control, adjustment of the mA and/or kV according to patient size and/or use of iterative reconstruction technique. CONTRAST:  OMNIPAQUE IOHEXOL 350 MG/ML SOLN COMPARISON:  CT abdomen and pelvis 12/15/2022 FINDINGS: CTA CHEST FINDINGS Cardiovascular: Preferential opacification of the thoracic aorta. No evidence of thoracic aortic aneurysm or dissection. Normal heart size. No pericardial effusion. Mediastinum/Nodes: No enlarged mediastinal, hilar, or axillary lymph nodes. Thyroid gland, trachea, and esophagus demonstrate no significant findings. Lungs/Pleura: Mild emphysematous changes are present. Lungs are otherwise clear. No pleural effusion or pneumothorax. There is a calcified granuloma in the right lower lobe. Musculoskeletal: No chest wall abnormality. No acute or significant osseous findings. Review of the MIP images confirms the above findings. CTA ABDOMEN AND PELVIS FINDINGS VASCULAR Aorta: Normal caliber aorta without  aneurysm, dissection, vasculitis or significant stenosis. There is mild calcified atherosclerotic disease throughout the aorta. Celiac: Patent without evidence of aneurysm, dissection, vasculitis or significant stenosis. SMA: Patent without evidence of aneurysm, dissection, vasculitis or significant stenosis. Renals: Both renal arteries are patent without evidence of aneurysm, dissection, vasculitis,  fibromuscular dysplasia or significant stenosis. IMA: Patent without evidence of aneurysm, dissection, vasculitis or significant stenosis. Inflow: There is focal 50% stenosis in the mid left common iliac artery secondary to noncalcified plaque. There is no evidence for aneurysm or dissection. There is moderate calcified atherosclerotic disease in the common iliac arteries. Veins: No obvious venous abnormality within the limitations of this arterial phase study. Review of the MIP images confirms the above findings. NON-VASCULAR Hepatobiliary: No focal liver abnormality is seen. Status post cholecystectomy. No biliary dilatation. Pancreas: Unremarkable. No pancreatic ductal dilatation or surrounding inflammatory changes. Spleen: Normal in size without focal abnormality. Adrenals/Urinary Tract: Adrenal glands are unremarkable. Kidneys are normal, without renal calculi, focal lesion, or hydronephrosis. Bladder is unremarkable. Stomach/Bowel: Stomach is within normal limits. Appendix is not visualized. No evidence of bowel wall thickening, distention, or inflammatory changes. There are scattered colonic diverticula. Lymphatic: No enlarged lymph nodes are seen. Reproductive: Prostate is unremarkable. Other: No abdominal wall hernia or abnormality. No abdominopelvic ascites. Musculoskeletal: No acute or significant osseous findings. Review of the MIP images confirms the above findings. IMPRESSION: 1. No evidence for aortic dissection or aneurysm. 2. Focal 50% stenosis in the mid left common iliac artery secondary to  noncalcified plaque. 3. No acute localizing process in the chest, abdomen or pelvis. 4. Colonic diverticulosis without evidence for diverticulitis. Aortic Atherosclerosis (ICD10-I70.0) and Emphysema (ICD10-J43.9). Electronically Signed   By: Darliss Cheney M.D.   On: 06/07/2023 19:28   DG Chest 2 View  Result Date: 06/07/2023 CLINICAL DATA:  Left-sided chest pain radiating to neck for 2-3 days, history of coronary artery disease EXAM: CHEST - 2 VIEW COMPARISON:  03/12/2023 FINDINGS: Frontal and lateral views of the chest demonstrate an unremarkable cardiac silhouette. No acute airspace disease, effusion, or pneumothorax. Background emphysema again noted. No acute bony abnormalities. IMPRESSION: 1. No acute intrathoracic process. Electronically Signed   By: Sharlet Salina M.D.   On: 06/07/2023 18:27    Pending Labs Unresulted Labs (From admission, onward)     Start     Ordered   06/07/23 2109  Type and screen  Once,   STAT        06/07/23 2108            Vitals/Pain Today's Vitals   06/07/23 2025 06/07/23 2037 06/07/23 2130 06/07/23 2134  BP:   126/83   Pulse:   64   Resp:   17   Temp:  98.2 F (36.8 C)    TempSrc:  Oral    SpO2:   99%   Weight:      Height:      PainSc: 7    4     Isolation Precautions No active isolations  Medications Medications  pantoprazole (PROTONIX) 80 mg /NS 100 mL IVPB (has no administration in time range)  sucralfate (CARAFATE) tablet 1 g (1 g Oral Given 06/07/23 2129)  acetaminophen (TYLENOL) tablet 650 mg (has no administration in time range)  clopidogrel (PLAVIX) tablet 75 mg (has no administration in time range)  rosuvastatin (CRESTOR) tablet 40 mg (has no administration in time range)  ondansetron (ZOFRAN) injection 4 mg (has no administration in time range)  heparin injection 5,000 Units (has no administration in time range)  lactated ringers infusion (has no administration in time range)  aspirin EC tablet 81 mg (has no administration in  time range)  sodium chloride 0.9 % bolus 1,000 mL (0 mLs Intravenous Stopped 06/07/23 1907)  sodium chloride 0.9 % bolus 500 mL (0  mLs Intravenous Stopped 06/07/23 1947)  alum & mag hydroxide-simeth (MAALOX/MYLANTA) 200-200-20 MG/5ML suspension 30 mL (30 mLs Oral Given 06/07/23 1845)  lidocaine (XYLOCAINE) 2 % viscous mouth solution 15 mL (15 mLs Mouth/Throat Given 06/07/23 1845)  iohexol (OMNIPAQUE) 350 MG/ML injection 100 mL (100 mLs Intravenous Contrast Given 06/07/23 1853)  nitroGLYCERIN (NITROGLYN) 2 % ointment 1 inch (1 inch Topical Given 06/07/23 2024)    Mobility walks     Focused Assessments Cardiac Assessment Handoff:    Lab Results  Component Value Date   CKTOTAL 118 04/01/2017   TROPONINI <0.03 02/19/2019   No results found for: "DDIMER" Does the Patient currently have chest pain? Yes    R Recommendations: See Admitting Provider Note  Report given to:   Additional Notes: Donzella Carrol

## 2023-06-07 NOTE — ED Provider Notes (Signed)
[facility] Provider Note    Event Date/Time   First MD Initiated Contact with Patient 06/07/23 1703     (approximate)   History   Chest Pain   HPI  Alex Brandt is a 47 y.o. male  with PMHx STEMI, CAD, HTN, HLD, HFpEF here with chest pain and SOB. Pt reports that for the past 2-3 days he has had intermittent aching, pressure like chest pain with fatigue. This has been more severe today with associated SOB, diaphoresis. Feels similar to his heart attack. Pt was given ASA, nitroglycerin paste w/ improvement. He reports no major leg swelling. Sx have been off and on, no specific alleviating or aggravating factors however.       Physical Exam   Triage Vital Signs: ED Triage Vitals  Encounter Vitals Group     BP      Systolic BP Percentile      Diastolic BP Percentile      Pulse      Resp      Temp      Temp src      SpO2      Weight      Height      Head Circumference      Peak Flow      Pain Score      Pain Loc      Pain Education      Exclude from Growth Chart     Most recent vital signs: Vitals:   06/07/23 1930 06/07/23 2015  BP: 99/62 121/75  Pulse: 61 67  Resp: (!) 21 14  Temp:    SpO2: 100% 100%     General: Awake, no distress.  CV:  Good peripheral perfusion. RRR.  Resp:  Normal work of breathing. Lungs clear bilaterally. No rales, wheezes. Abd:  No distention. No tenderness. Other:  Trace pitting edema Bl LE.   ED Results / Procedures / Treatments   Labs (all labs ordered are listed, but only abnormal results are displayed) Labs Reviewed  COMPREHENSIVE METABOLIC PANEL - Abnormal; Notable for the following components:      Result Value   Glucose, Bld 102 (*)    Creatinine, Ser 1.27 (*)    Calcium 8.7 (*)    All other components within normal limits  CBC WITH DIFFERENTIAL/PLATELET  BRAIN NATRIURETIC PEPTIDE  LIPASE, BLOOD  TROPONIN I (HIGH SENSITIVITY)  TROPONIN I (HIGH SENSITIVITY)     EKG Normal  sinus rhythm, VR 76. PR 138, QRS 85, QTc 406. No acute ST elevatiosn or depressions. No ischemia or infarct. Possible old inferior infarct noted.   RADIOLOGY CXR: Clear CT Angio: Negative   I also independently reviewed and agree with radiologist interpretations.   PROCEDURES:  Critical Care performed: No  .1-3 Lead EKG Interpretation  Performed by: Shaune Pollack, MD Authorized by: Shaune Pollack, MD     Interpretation: normal     ECG rate:  60-80   ECG rate assessment: normal     Rhythm: sinus rhythm     Ectopy: none     Conduction: normal   Comments:     Indication: Chest pain    MEDICATIONS ORDERED IN ED: Medications  sodium chloride 0.9 % bolus 1,000 mL (0 mLs Intravenous Stopped 06/07/23 1907)  sodium chloride 0.9 % bolus 500 mL (0 mLs Intravenous Stopped 06/07/23 1947)  alum & mag hydroxide-simeth (MAALOX/MYLANTA) 200-200-20 MG/5ML suspension 30 mL (30 mLs Oral Given 06/07/23 1845)  lidocaine (XYLOCAINE) 2 %  viscous mouth solution 15 mL (15 mLs Mouth/Throat Given 06/07/23 1845)  iohexol (OMNIPAQUE) 350 MG/ML injection 100 mL (100 mLs Intravenous Contrast Given 06/07/23 1853)  nitroGLYCERIN (NITROGLYN) 2 % ointment 1 inch (1 inch Topical Given 06/07/23 2024)     IMPRESSION / MDM / ASSESSMENT AND PLAN / ED COURSE  I reviewed the triage vital signs and the nursing notes.                              Differential diagnosis includes, but is not limited to, unstable angina, NSTEMI, coronary spasm, dissection, PE, anemia, hypoperfusion from hypotension and dehydration  Patient's presentation is most consistent with acute presentation with potential threat to life or bodily function.  The patient is on the cardiac monitor to evaluate for evidence of arrhythmia and/or significant heart rate changes  47 yo M with h/o CAD, CHF, here with severe ongoing chest pain.  Of note, pt states his sx feel better after taking his AM and PM meds - and he is rx'ed ranexa though he  stopped his imdur. EKG here is non ischemic and trop neg x 2. Reviewed his prior cath results from June which show 50% LAD, patent RCA stent. Pain returned after d/c nitroglycerin ointment. Will replace, admit for further management and likely cards eval in AM. Discussed with Dr. Darrold Junker - based on normal trop, EKG, recent cath, no urgent intervention needed for tonight. ASA 325 has been given.   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   Shaune Pollack, MD 06/07/23 2037

## 2023-06-08 DIAGNOSIS — N179 Acute kidney failure, unspecified: Secondary | ICD-10-CM | POA: Diagnosis present

## 2023-06-08 DIAGNOSIS — R072 Precordial pain: Secondary | ICD-10-CM | POA: Diagnosis not present

## 2023-06-08 MED ORDER — ASPIRIN 81 MG PO TBEC
81.0000 mg | DELAYED_RELEASE_TABLET | Freq: Every day | ORAL | Status: DC
  Administered 2023-06-08: 81 mg via ORAL
  Filled 2023-06-08: qty 1

## 2023-06-08 MED ORDER — RANOLAZINE ER 500 MG PO TB12: 500.0000 mg | ORAL_TABLET | Freq: Two times a day (BID) | ORAL | 0 refills | Status: AC

## 2023-06-08 MED ORDER — RANOLAZINE ER 500 MG PO TB12
500.0000 mg | ORAL_TABLET | Freq: Two times a day (BID) | ORAL | Status: DC
  Administered 2023-06-08: 500 mg via ORAL
  Filled 2023-06-08: qty 1

## 2023-06-08 MED ORDER — PANTOPRAZOLE SODIUM 40 MG PO TBEC
80.0000 mg | DELAYED_RELEASE_TABLET | Freq: Every day | ORAL | Status: DC
  Administered 2023-06-08: 80 mg via ORAL
  Filled 2023-06-08: qty 2

## 2023-06-08 NOTE — Consult Note (Signed)
[facility] CLINIC CARDIOLOGY CONSULT NOTE       Patient ID: Alex Brandt MRN: [id number] DOB/AGE: 45-10-1975 45 y.o.  Admit date: 06/07/2023 Referring Physician Dr. Irena Cords Primary Physician [facility] Primary Cardiologist Dr. Dorothyann Peng Reason for Consultation chest pain  HPI: Alex Brandt is a 47 y.o. male  with a past medical history of CAD w/ hx inferior STEMI s/p PCI with DES to RCA (06/02/22), hx right knee arthroplasty, ongoing tobacco use who presented to the ED on 06/07/2023 for chest pressure.  Cardiology was consulted for further assistance.  Patient presented to the ED for evaluation of 3 days of chest pain which he describes as pressure on the left side of his chest with radiation down his left arm and up the left side of his neck.  States that he has chronic chest pain but it had been worse for the last few days and he decided to come be evaluated.  Pain occurs both at rest and with stress.  He feels that this chest pain is similar to episodes he has had in the past.  He denies any shortness of breath or palpitations.  States at home he has been taking only his aspirin, Plavix, Crestor and omeprazole. He had previously been prescribed Ranexa for management of his symptoms but states that he has not been taking this at home and was unaware that this was prescribed.  Workup in the ED notable for creatinine 1.27 (baseline 1.2-1.3), potassium 3.7, hemoglobin 15.9.  BNP 49.4.  Troponins trended 4 > 4.  EKG with normal sinus rhythm, nonacute, stable from prior.  He was treated with topical nitroglycerin in the ED.  At the time of my evaluation this morning he is resting comfortably in bed.  States that overall the pain has improved but he is still experiencing mild symptoms.  He expressed concern that he continues to have symptoms.  He was most recently hospitalized in June for similar symptoms and underwent a left heart catheterization at that time.   Discussed this hospital course and cath results in detail with the patient. Patient interested in making adjustments to medications to improve anginal symptoms. Also reports that he is still smoking but has cut back to 0.5 ppd.   Review of systems complete and found to be negative unless listed above    Past Medical History:  Diagnosis Date   Angina at rest    Arthritis    Coronary artery disease 05/05/2021   a.) LHC 05/05/2021: EF 55-65%; normal LV function and LVEDP; 50% mLAD, 50% D1, 50% p-mRCA; med mgmt.   DJD (degenerative joint disease) of knee    Dyspnea    GERD (gastroesophageal reflux disease)    HLD (hyperlipidemia)    Hyperkalemia    Hypertension    Migraines    Valvular insufficiency 12/18/2014   a.) TTE 12/18/2014: EF >55%; mild PR, moderate MR/TR.    Past Surgical History:  Procedure Laterality Date   APPENDECTOMY     CARDIAC CATHETERIZATION     CHOLECYSTECTOMY     COLONOSCOPY     CORONARY/GRAFT ACUTE MI REVASCULARIZATION N/A 06/02/2022   Procedure: Coronary/Graft Acute MI Revascularization;  Surgeon: Alwyn Pea, MD;  Location: [facility] INVASIVE CV LAB;  Service: Cardiovascular;  Laterality: N/A;   ESOPHAGOGASTRODUODENOSCOPY     KNEE ARTHROSCOPY WITH MEDIAL MENISECTOMY Right 08/30/2021   Procedure: Right knee arthroscopic partial medial meniscectomy;  Surgeon: Signa Kell, MD;  Location: [facility] ORS;  Service: Orthopedics;  Laterality:  Right;   KNEE SURGERY Right    meniscus repair in [facility] and again at [facility]   LEFT HEART CATH AND CORONARY ANGIOGRAPHY Left 05/05/2021   Procedure: LEFT HEART CATH AND CORONARY ANGIOGRAPHY;  Surgeon: Alwyn Pea, MD;  Location: [facility] INVASIVE CV LAB;  Service: Cardiovascular;  Laterality: Left;   LEFT HEART CATH AND CORONARY ANGIOGRAPHY N/A 06/02/2022   Procedure: LEFT HEART CATH AND CORONARY ANGIOGRAPHY;  Surgeon: Alwyn Pea, MD;  Location: [facility] INVASIVE CV LAB;  Service: Cardiovascular;  Laterality: N/A;   LEFT  HEART CATH AND CORONARY ANGIOGRAPHY Right 03/13/2023   Procedure: LEFT HEART CATH AND CORONARY ANGIOGRAPHY with possible intervention;  Surgeon: Alwyn Pea, MD;  Location: [facility] INVASIVE CV LAB;  Service: Cardiovascular;  Laterality: Right;   PARTIAL KNEE ARTHROPLASTY Right 02/15/2022   Procedure: UNICOMPARTMENTAL KNEE;  Surgeon: Christena Flake, MD;  Location: [facility] ORS;  Service: Orthopedics;  Laterality: Right;    Medications Prior to Admission  Medication Sig Dispense Refill Last Dose   aspirin EC 81 MG tablet Take 81 mg by mouth daily.   06/07/2023   clopidogrel (PLAVIX) 75 MG tablet Take 75 mg by mouth daily.   06/07/2023   omeprazole (PRILOSEC) 40 MG capsule Take 40 mg by mouth daily.   06/07/2023   pantoprazole (PROTONIX) 40 MG tablet Take 40 mg by mouth daily.   06/07/2023   rosuvastatin (CRESTOR) 40 MG tablet Take 40 mg by mouth at bedtime.   06/06/2023   acetaminophen (TYLENOL) 325 MG tablet Take 2 tablets (650 mg total) by mouth every 6 (six) hours as needed for mild pain, fever or headache (or Fever >/= 101). 20 tablet 1 prn at prn   cyclobenzaprine (FLEXERIL) 5 MG tablet Take 1 tablet (5 mg total) by mouth 3 (three) times daily as needed for muscle spasms. This can make you sleepy. (Patient not taking: Reported on 03/12/2023) 60 tablet 0 Not Taking   furosemide (LASIX) 20 MG tablet Take 20 mg by mouth daily as needed for fluid. (Patient not taking: Reported on 03/12/2023)   Not Taking   isosorbide mononitrate (IMDUR) 30 MG 24 hr tablet Take 30 mg by mouth daily. (Patient not taking: Reported on 06/07/2023)   Not Taking   nitroGLYCERIN (NITROSTAT) 0.4 MG SL tablet Place 0.4 mg under the tongue every 5 (five) minutes as needed for chest pain.   prn at prn   predniSONE (DELTASONE) 10 MG tablet Take 4 tablets once a day for 3 days starting tomorrow, 3 tablets once a day for 3 days, 2 tablets for 3 days and then 1 tablet once a day for 3 days (Patient not taking: Reported on 03/12/2023) 30 tablet 0 Not  Taking   Social History   Socioeconomic History   Marital status: Married    Spouse name: Not on file   Number of children: Not on file   Years of education: Not on file   Highest education level: Not on file  Occupational History   Not on file  Tobacco Use   Smoking status: Every Day    Current packs/day: 0.50    Average packs/day: 0.5 packs/day for 20.0 years (10.0 ttl pk-yrs)    Types: Cigarettes   Smokeless tobacco: Never  Vaping Use   Vaping status: Never Used  Substance and Sexual Activity   Alcohol use: Not Currently   Drug use: Never   Sexual activity: Not on file  Other Topics Concern   Not on file  Social History Narrative   Not on file   Social Determinants of Health   Financial Resource Strain: Not on file  Food Insecurity: No Food Insecurity (12/26/2022)   Hunger Vital Sign    Worried About Running Out of Food in the Last Year: Never true    Ran Out of Food in the Last Year: Never true  Transportation Needs: No Transportation Needs (12/26/2022)   PRAPARE - Administrator, Civil Service (Medical): No    Lack of Transportation (Non-Medical): No  Physical Activity: Not on file  Stress: Not on file  Social Connections: Not on file  Intimate Partner Violence: Not At Risk (12/26/2022)   Humiliation, Afraid, Rape, and Kick questionnaire    Fear of Current or Ex-Partner: No    Emotionally Abused: No    Physically Abused: No    Sexually Abused: No    Family History  Problem Relation Age of Onset   Heart attack Mother    Heart attack Father    Heart attack Brother      Vitals:   06/08/23 0845 06/08/23 1106 06/08/23 1107 06/08/23 1109  BP: 122/80 110/68 109/71 110/77  Pulse: 70 (!) 54 (!) 58 66  Resp: 20 20 20 20   Temp: 97.9 F (36.6 C) 98.5 F (36.9 C)    TempSrc: Oral Oral    SpO2: 98% 99% 98% 97%  Weight:      Height:        PHYSICAL EXAM General: Well-appearing male, well nourished, in no acute distress sitting upright in hospital  bed with significant other present at bedside. HEENT: Normocephalic and atraumatic. Neck: No JVD.  Lungs: Normal respiratory effort on room air. Clear bilaterally to auscultation. No wheezes, crackles, rhonchi.  Heart: HRRR. Normal S1 and S2 without gallops or murmurs.  Abdomen: Non-distended appearing.  Msk: Normal strength and tone for age. Extremities: Warm and well perfused. No clubbing, cyanosis. No edema.  Neuro: Alert and oriented X 3. Psych: Answers questions appropriately.   Labs: Basic Metabolic Panel: Recent Labs    06/07/23 1750  NA 137  K 3.7  CL 107  CO2 22  GLUCOSE 102*  BUN 17  CREATININE 1.27*  CALCIUM 8.7*   Liver Function Tests: Recent Labs    06/07/23 1750  AST 20  ALT 24  ALKPHOS 69  BILITOT 0.5  PROT 7.2  ALBUMIN 3.9   Recent Labs    06/07/23 1918  LIPASE 39   CBC: Recent Labs    06/07/23 1750  WBC 8.6  NEUTROABS 6.3  HGB 15.9  HCT 46.1  MCV 86.8  PLT 202   Cardiac Enzymes: Recent Labs    06/07/23 1750 06/07/23 1918  TROPONINIHS 4 4   BNP: Recent Labs    06/07/23 1750  BNP 49.4   D-Dimer: No results for input(s): "DDIMER" in the last 72 hours. Hemoglobin A1C: No results for input(s): "HGBA1C" in the last 72 hours. Fasting Lipid Panel: No results for input(s): "CHOL", "HDL", "LDLCALC", "TRIG", "CHOLHDL", "LDLDIRECT" in the last 72 hours. Thyroid Function Tests: No results for input(s): "TSH", "T4TOTAL", "T3FREE", "THYROIDAB" in the last 72 hours.  Invalid input(s): "FREET3" Anemia Panel: No results for input(s): "VITAMINB12", "FOLATE", "FERRITIN", "TIBC", "IRON", "RETICCTPCT" in the last 72 hours.   Radiology: CT Angio Chest/Abd/Pel for Dissection W and/or Wo Contrast  Result Date: 06/07/2023 CLINICAL DATA:  Aortic syndrome suspected.  Left chest pain. EXAM: CT ANGIOGRAPHY CHEST, ABDOMEN AND PELVIS TECHNIQUE: Non-contrast CT of the chest was  initially obtained. Multidetector CT imaging through the chest, abdomen and  pelvis was performed using the standard protocol during bolus administration of intravenous contrast. Multiplanar reconstructed images and MIPs were obtained and reviewed to evaluate the vascular anatomy. RADIATION DOSE REDUCTION: This exam was performed according to the departmental dose-optimization program which includes automated exposure control, adjustment of the mA and/or kV according to patient size and/or use of iterative reconstruction technique. CONTRAST:  OMNIPAQUE IOHEXOL 350 MG/ML SOLN COMPARISON:  CT abdomen and pelvis 12/15/2022 FINDINGS: CTA CHEST FINDINGS Cardiovascular: Preferential opacification of the thoracic aorta. No evidence of thoracic aortic aneurysm or dissection. Normal heart size. No pericardial effusion. Mediastinum/Nodes: No enlarged mediastinal, hilar, or axillary lymph nodes. Thyroid gland, trachea, and esophagus demonstrate no significant findings. Lungs/Pleura: Mild emphysematous changes are present. Lungs are otherwise clear. No pleural effusion or pneumothorax. There is a calcified granuloma in the right lower lobe. Musculoskeletal: No chest wall abnormality. No acute or significant osseous findings. Review of the MIP images confirms the above findings. CTA ABDOMEN AND PELVIS FINDINGS VASCULAR Aorta: Normal caliber aorta without aneurysm, dissection, vasculitis or significant stenosis. There is mild calcified atherosclerotic disease throughout the aorta. Celiac: Patent without evidence of aneurysm, dissection, vasculitis or significant stenosis. SMA: Patent without evidence of aneurysm, dissection, vasculitis or significant stenosis. Renals: Both renal arteries are patent without evidence of aneurysm, dissection, vasculitis, fibromuscular dysplasia or significant stenosis. IMA: Patent without evidence of aneurysm, dissection, vasculitis or significant stenosis. Inflow: There is focal 50% stenosis in the mid left common iliac artery secondary to noncalcified plaque. There  is no evidence for aneurysm or dissection. There is moderate calcified atherosclerotic disease in the common iliac arteries. Veins: No obvious venous abnormality within the limitations of this arterial phase study. Review of the MIP images confirms the above findings. NON-VASCULAR Hepatobiliary: No focal liver abnormality is seen. Status post cholecystectomy. No biliary dilatation. Pancreas: Unremarkable. No pancreatic ductal dilatation or surrounding inflammatory changes. Spleen: Normal in size without focal abnormality. Adrenals/Urinary Tract: Adrenal glands are unremarkable. Kidneys are normal, without renal calculi, focal lesion, or hydronephrosis. Bladder is unremarkable. Stomach/Bowel: Stomach is within normal limits. Appendix is not visualized. No evidence of bowel wall thickening, distention, or inflammatory changes. There are scattered colonic diverticula. Lymphatic: No enlarged lymph nodes are seen. Reproductive: Prostate is unremarkable. Other: No abdominal wall hernia or abnormality. No abdominopelvic ascites. Musculoskeletal: No acute or significant osseous findings. Review of the MIP images confirms the above findings. IMPRESSION: 1. No evidence for aortic dissection or aneurysm. 2. Focal 50% stenosis in the mid left common iliac artery secondary to noncalcified plaque. 3. No acute localizing process in the chest, abdomen or pelvis. 4. Colonic diverticulosis without evidence for diverticulitis. Aortic Atherosclerosis (ICD10-I70.0) and Emphysema (ICD10-J43.9). Electronically Signed   By: Darliss Cheney M.D.   On: 06/07/2023 19:28   DG Chest 2 View  Result Date: 06/07/2023 CLINICAL DATA:  Left-sided chest pain radiating to neck for 2-3 days, history of coronary artery disease EXAM: CHEST - 2 VIEW COMPARISON:  03/12/2023 FINDINGS: Frontal and lateral views of the chest demonstrate an unremarkable cardiac silhouette. No acute airspace disease, effusion, or pneumothorax. Background emphysema again noted.  No acute bony abnormalities. IMPRESSION: 1. No acute intrathoracic process. Electronically Signed   By: Sharlet Salina M.D.   On: 06/07/2023 18:27    LHC 03/13/2023:    Mid LAD lesion is 50% stenosed.   1st Diag lesion is 50% stenosed.   Prox RCA lesion is 25% stenosed.  Non-stenotic Prox RCA to Mid RCA lesion was previously treated.   Non-stenotic Mid RCA lesion was previously treated.   There is mild left ventricular systolic dysfunction.   LV end diastolic pressure is normal.   There is no mitral valve regurgitation.   Coronaries Left main large minor irregularities LAD large 50% mid LAD unchanged from previously at the bifurcation with the diagonal Circumflex large minor irregularities RCA large widely patent mid stent minor irregularities Right dominant system Intervention deferred not indicated Recommend medical therapy  ECHO 03/13/2023:  1. Left ventricular ejection fraction, by estimation, is 55 to 60%. The  left ventricle has normal function. The left ventricle has no regional wall motion abnormalities. Left ventricular diastolic parameters are indeterminate.   2. Right ventricular systolic function is normal. The right ventricular size is normal.   3. The mitral valve is normal in structure. Mild mitral valve regurgitation. No evidence of mitral stenosis.   4. The aortic valve is normal in structure. Aortic valve regurgitation is not visualized. No aortic stenosis is present.   5. The inferior vena cava is normal in size with greater than 50% respiratory variability, suggesting right atrial pressure of 3 mmHg.   TELEMETRY reviewed by me [facility]) 06/08/2023 : NSR rate 70s  EKG reviewed by me: NSR rate 76 bpm, non-acute, stable from prior  Data reviewed by me [facility]) 06/08/2023: last 24h vitals tele labs imaging I/O ED provider note, admission H&P  Principal Problem:   Chest pain Active Problems:   GERD (gastroesophageal reflux disease)   Coronary artery disease   Hypertension    Nicotine dependence   Chronic diastolic CHF (congestive heart failure) (HCC)   AKI (acute kidney injury) (HCC)    ASSESSMENT AND PLAN:  Alex Brandt is a 47 y.o. male  with a past medical history of CAD w/ hx inferior STEMI s/p PCI with DES to RCA (06/02/22), hx right knee arthroplasty, ongoing tobacco use who presented to the ED on 06/07/2023 for chest pressure.  Cardiology was consulted for further evaluation of unstable angina.  # Unstable angina # CAD s/p inferior STEMI 06/02/2022 Patient with chronic chest pain since STEMI last year presenting to the ED for worsening chest pressure. Was seen 03/2023 for similar episode and underwent LHC which demonstrated stable, nonobstructive disease. Plan at that time was for medical management. Now reporting chest pressure with radiation down L arm for last 3 days. Troponins negative in the ED 4 > 4. EKG nonacute, stable from prior. CP improved this AM at the time of my evaluation. -No plan for further cardiac diagnostics. -Start ranexa 500 mg twice daily for antianginal management. Patient reports he has not been taking this medication at home. Previously took imdur but felt this made his CP worse.  -Continue aspirin, plavix, crestor.  -Further additions of BB, ARB limited by borderline low BP and bradycardia.   # GERD Hx GI bleeding without recent occurrence. Long-standing use of PPI with improvement of symptoms.  -Management per primary.  # Tobacco use Patient reports he has cut his smoking back to 0.5 ppd. Has nicotine patches at home but feels these make him want to smoke more.  -Strongly encouraged continued smoking cessation efforts.  Ok for discharge today from a cardiac perspective. Will arrange for follow up in clinic with Dr. Juliann Pares in 1-2 weeks.   This patient's plan of care was discussed and created with Dr. Darrold Junker and he is in agreement.  SignedGale Journey, PA-C  06/08/2023, 11:54 AM  [facility] Cardiology

## 2023-06-08 NOTE — Assessment & Plan Note (Signed)
Currently stable patient is euvolemic.  As needed diuresis as needed only.

## 2023-06-08 NOTE — Assessment & Plan Note (Addendum)
Lab Results  Component Value Date   CREATININE 1.27 (H) 06/07/2023   CREATININE 1.24 03/13/2023   CREATININE 1.35 (H) 03/12/2023  Mild AKI 1.27 creatinine, we will follow. Currently due to Low BP meds are held maybe that may results in improvement.  In the meantime we will avoid contrast and renally dose needed medications.

## 2023-06-08 NOTE — Assessment & Plan Note (Signed)
Vitals:   06/07/23 1719 06/07/23 1810 06/07/23 1830 06/07/23 1906  BP: 111/67 93/62 104/61 118/66   06/07/23 1930 06/07/23 2015 06/07/23 2130 06/07/23 2204  BP: 99/62 121/75 126/83 108/60   06/08/23 0201  BP: 108/67  Blood pressures have been soft.  Therefore home medication regimen with Imdur and Lasix have been held.  A.m. team to reassess and restart as deemed appropriate.

## 2023-06-08 NOTE — Discharge Summary (Signed)
Physician Discharge Summary   Patient: Alex Brandt MRN: [id number] DOB: Feb 21, 1976  Admit date:     06/07/2023  Discharge date: 06/08/23  Discharge Physician: Lurene Shadow   PCP: [facility]   Recommendations at discharge:   Follow-up with Dr. Juliann Pares, cardiologist, in 1 week  Discharge Diagnoses: Principal Problem:   Chest pain Active Problems:   Coronary artery disease   Chronic diastolic CHF (congestive heart failure) (HCC)   Hypertension   GERD (gastroesophageal reflux disease)   Nicotine dependence   AKI (acute kidney injury) (HCC)  Resolved Problems:   * No resolved hospital problems. *  Hospital Course:  Alex Brandt is a 47 y.o. 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  He was admitted for observation.  Troponins were negative.  CTA chest did not show any acute cardiopulmonary abnormality.  He was evaluated by the cardiologist.  Ranexa was prescribed and outpatient follow-up was recommended for further evaluation.  Chest pain has improved.  He has been advised to stop smoking cigarettes.  His condition has improved and he is deemed stable for discharge to home today.  Discharge plan was discussed with the patient and his wife at the bedside.       Consultants: Cardiologist Procedures performed: None Disposition: Home Diet recommendation:  Discharge Diet Orders (From admission, onward)     Start     Ordered   06/08/23 0000  Diet - low sodium heart healthy        06/08/23 1234           Cardiac diet DISCHARGE MEDICATION: Allergies as of 06/08/2023       Reactions   Shellfish Allergy Anaphylaxis, Swelling   Hydrocodone Nausea And  Vomiting   No [employer] Food Allergies Swelling   The pt is allergic to tea ex. Like sweet tea to drink   Toradol [ketorolac Tromethamine] Other (See Comments)   Patient states he cannot take it with one of his heart medications   Tramadol Other (See Comments)   Migraines   Tramadol Hcl Other (See Comments)        Medication List     STOP taking these medications    cyclobenzaprine 5 MG tablet Commonly known as: FLEXERIL   furosemide 20 MG tablet Commonly known as: LASIX   isosorbide mononitrate 30 MG 24 hr tablet Commonly known as: IMDUR   pantoprazole 40 MG tablet Commonly known as: PROTONIX   predniSONE 10 MG tablet Commonly known as: DELTASONE       TAKE these medications    acetaminophen 325 MG tablet Commonly known as: TYLENOL Take 2 tablets (650 mg total) by mouth every 6 (six) hours as needed for mild pain, fever or headache (or Fever >/= 101).   aspirin EC 81 MG tablet Take 81 mg by mouth daily.   clopidogrel 75 MG tablet Commonly known as: PLAVIX Take 75 mg by mouth daily.   nitroGLYCERIN 0.4 MG SL tablet Commonly known as: NITROSTAT Place 0.4 mg under the tongue every 5 (five) minutes as needed for chest pain.   omeprazole 40 MG capsule Commonly known as: PRILOSEC Take 40 mg by mouth daily.   ranolazine 500 MG 12 hr  tablet Commonly known as: RANEXA Take 1 tablet (500 mg total) by mouth 2 (two) times daily.   rosuvastatin 40 MG tablet Commonly known as: CRESTOR Take 40 mg by mouth at bedtime.        Follow-up Information     Alwyn Pea, MD. Go in 1 week(s).   Specialties: Cardiology, Internal Medicine Contact information: [address] [place] [address] [phone]                Discharge Exam: Alex Brandt Weights   06/07/23 1720  Weight: 103.5 kg   GEN: NAD SKIN: Warm and dry EYES: No pallor or icterus ENT: MMM CV: RRR PULM: CTA B ABD: soft, ND, NT, +BS CNS: AAO x 3, non focal EXT: No edema  or tenderness   Condition at discharge: good  The results of significant diagnostics from this hospitalization (including imaging, microbiology, ancillary and laboratory) are listed below for reference.   Imaging Studies: CT Angio Chest/Abd/Pel for Dissection W and/or Wo Contrast  Result Date: 06/07/2023 CLINICAL DATA:  Aortic syndrome suspected.  Left chest pain. EXAM: CT ANGIOGRAPHY CHEST, ABDOMEN AND PELVIS TECHNIQUE: Non-contrast CT of the chest was initially obtained. Multidetector CT imaging through the chest, abdomen and pelvis was performed using the standard protocol during bolus administration of intravenous contrast. Multiplanar reconstructed images and MIPs were obtained and reviewed to evaluate the vascular anatomy. RADIATION DOSE REDUCTION: This exam was performed according to the departmental dose-optimization program which includes automated exposure control, adjustment of the mA and/or kV according to patient size and/or use of iterative reconstruction technique. CONTRAST:  OMNIPAQUE IOHEXOL 350 MG/ML SOLN COMPARISON:  CT abdomen and pelvis 12/15/2022 FINDINGS: CTA CHEST FINDINGS Cardiovascular: Preferential opacification of the thoracic aorta. No evidence of thoracic aortic aneurysm or dissection. Normal heart size. No pericardial effusion. Mediastinum/Nodes: No enlarged mediastinal, hilar, or axillary lymph nodes. Thyroid gland, trachea, and esophagus demonstrate no significant findings. Lungs/Pleura: Mild emphysematous changes are present. Lungs are otherwise clear. No pleural effusion or pneumothorax. There is a calcified granuloma in the right lower lobe. Musculoskeletal: No chest wall abnormality. No acute or significant osseous findings. Review of the MIP images confirms the above findings. CTA ABDOMEN AND PELVIS FINDINGS VASCULAR Aorta: Normal caliber aorta without aneurysm, dissection, vasculitis or significant stenosis. There is mild calcified atherosclerotic disease  throughout the aorta. Celiac: Patent without evidence of aneurysm, dissection, vasculitis or significant stenosis. SMA: Patent without evidence of aneurysm, dissection, vasculitis or significant stenosis. Renals: Both renal arteries are patent without evidence of aneurysm, dissection, vasculitis, fibromuscular dysplasia or significant stenosis. IMA: Patent without evidence of aneurysm, dissection, vasculitis or significant stenosis. Inflow: There is focal 50% stenosis in the mid left common iliac artery secondary to noncalcified plaque. There is no evidence for aneurysm or dissection. There is moderate calcified atherosclerotic disease in the common iliac arteries. Veins: No obvious venous abnormality within the limitations of this arterial phase study. Review of the MIP images confirms the above findings. NON-VASCULAR Hepatobiliary: No focal liver abnormality is seen. Status post cholecystectomy. No biliary dilatation. Pancreas: Unremarkable. No pancreatic ductal dilatation or surrounding inflammatory changes. Spleen: Normal in size without focal abnormality. Adrenals/Urinary Tract: Adrenal glands are unremarkable. Kidneys are normal, without renal calculi, focal lesion, or hydronephrosis. Bladder is unremarkable. Stomach/Bowel: Stomach is within normal limits. Appendix is not visualized. No evidence of bowel wall thickening, distention, or inflammatory changes. There are scattered colonic diverticula. Lymphatic: No enlarged lymph nodes are seen. Reproductive: Prostate is unremarkable. Other: No abdominal  wall hernia or abnormality. No abdominopelvic ascites. Musculoskeletal: No acute or significant osseous findings. Review of the MIP images confirms the above findings. IMPRESSION: 1. No evidence for aortic dissection or aneurysm. 2. Focal 50% stenosis in the mid left common iliac artery secondary to noncalcified plaque. 3. No acute localizing process in the chest, abdomen or pelvis. 4. Colonic diverticulosis  without evidence for diverticulitis. Aortic Atherosclerosis (ICD10-I70.0) and Emphysema (ICD10-J43.9). Electronically Signed   By: Darliss Cheney M.D.   On: 06/07/2023 19:28   DG Chest 2 View  Result Date: 06/07/2023 CLINICAL DATA:  Left-sided chest pain radiating to neck for 2-3 days, history of coronary artery disease EXAM: CHEST - 2 VIEW COMPARISON:  03/12/2023 FINDINGS: Frontal and lateral views of the chest demonstrate an unremarkable cardiac silhouette. No acute airspace disease, effusion, or pneumothorax. Background emphysema again noted. No acute bony abnormalities. IMPRESSION: 1. No acute intrathoracic process. Electronically Signed   By: Sharlet Salina M.D.   On: 06/07/2023 18:27    Microbiology: Results for orders placed or performed during the hospital encounter of 03/12/23  SARS Coronavirus 2 by RT PCR (hospital order, performed in [facility] hospital lab) *cepheid single result test* Anterior Nasal Swab     Status: None   Collection Time: 03/13/23  9:00 AM   Specimen: Anterior Nasal Swab  Result Value Ref Range Status   SARS Coronavirus 2 by RT PCR NEGATIVE NEGATIVE Final    Comment: (NOTE) SARS-CoV-2 target nucleic acids are NOT DETECTED.  The SARS-CoV-2 RNA is generally detectable in upper and lower respiratory specimens during the acute phase of infection. The lowest concentration of SARS-CoV-2 viral copies this assay can detect is 250 copies / mL. A negative result does not preclude SARS-CoV-2 infection and should not be used as the sole basis for treatment or other patient management decisions.  A negative result may occur with improper specimen collection / handling, submission of specimen other than nasopharyngeal swab, presence of viral mutation(s) within the areas targeted by this assay, and inadequate number of viral copies (<250 copies / mL). A negative result must be combined with clinical observations, patient history, and epidemiological information.  Fact  Sheet for Patients:   RoadLapTop.co.za  Fact Sheet for Healthcare Providers: http://kim-miller.com/  This test is not yet approved or  cleared by the [place] FDA and has been authorized for detection and/or diagnosis of SARS-CoV-2 by FDA under an Emergency Use Authorization (EUA).  This EUA will remain in effect (meaning this test can be used) for the duration of the COVID-19 declaration under Section 564(b)(1) of the Act, 21 U.S.C. section 360bbb-3(b)(1), unless the authorization is terminated or revoked sooner.  Performed at [facility], [address] [place], [place] [address]     Labs: CBC: Recent Labs  Lab 06/07/23 1750  WBC 8.6  NEUTROABS 6.3  HGB 15.9  HCT 46.1  MCV 86.8  PLT 202   Basic Metabolic Panel: Recent Labs  Lab 06/07/23 1750  NA 137  K 3.7  CL 107  CO2 22  GLUCOSE 102*  BUN 17  CREATININE 1.27*  CALCIUM 8.7*   Liver Function Tests: Recent Labs  Lab 06/07/23 1750  AST 20  ALT 24  ALKPHOS 69  BILITOT 0.5  PROT 7.2  ALBUMIN 3.9   CBG: No results for input(s): "GLUCAP" in the last 168 hours.  Discharge time spent: greater than 30 minutes.  Signed: Lurene Shadow, MD [facility] 06/08/2023

## 2023-06-08 NOTE — Assessment & Plan Note (Signed)
Strongly advised to quit smoking as it directly affects and indirectly causes multiple complications. Verbalized understanding.

## 2023-06-08 NOTE — Assessment & Plan Note (Signed)
Patient presenting with left-sided chest pain that has been radiating to his left arm and left side of the jaw. Patient states that he has really been having this since his cath last year intermittently but has been worse since the past 3 days.  He has been just writing out the pain and has not used any nitroglycerin or NSAIDs.  Patient was troponins are negative today discussed with the patient that this may actually also be GI related as he has a history of bleeding ulcers in the past.  However has he has continued to smoke and also has been using omeprazole likelihood of stent restenosis is also present and we will contact cardiology to consult for best testing and evaluation in his situation and scenario.  Patient verbalized understanding will admit to telemetry or PCU unit and monitor.  Patient follows with  [facility].  Patient strongly advised to stop smoking.

## 2023-06-08 NOTE — Assessment & Plan Note (Signed)
Troponins negative,EKG shows sinus rhythm 76 with a PR of 138 QTc 406, nonspecific ST changes in  inferior lateral leads.  Will continue patient on aspirin and statin along with DVT prophylaxis.  Patient took his Plavix today morning.  Cardiology consulted.

## 2023-06-08 NOTE — TOC CM/SW Note (Signed)
Transition of Care [facility]) - Inpatient Brief Assessment   Patient Details  Name: Alex Brandt MRN: [id number] Date of Birth: 08/07/1976  Transition of Care [facility]) CM/SW Contact:    Margarito Liner, LCSW Phone Number: 06/08/2023, 12:44 PM   Clinical Narrative: Patient has orders to discharge home today. CSW reviewed chart. No TOC needs identified. CSW signing off.  Transition of Care Asessment: Insurance and Status: Insurance coverage has been reviewed Patient has primary care physician: Yes Home environment has been reviewed: Single family home Prior level of function:: Not documented Prior/Current Home Services: No current home services Social Determinants of Health Reivew: SDOH reviewed no interventions necessary Readmission risk has been reviewed: Yes Transition of care needs: no transition of care needs at this time

## 2023-06-08 NOTE — Assessment & Plan Note (Signed)
IV PPI, Carafate started. Close monitoring. Type and screen. GI consult as deemed appropriate inpatient versus outpatient depending on patient's stay and outcome.

## 2023-08-07 ENCOUNTER — Other Ambulatory Visit: Payer: Self-pay

## 2023-08-07 ENCOUNTER — Emergency Department
Admission: EM | Admit: 2023-08-07 | Discharge: 2023-08-08 | Disposition: A | Discharge status: Home / Self Care | Payer: Medicaid Other | Attending: Emergency Medicine | Admitting: Emergency Medicine

## 2023-08-07 ENCOUNTER — Emergency Department: Payer: Medicaid Other

## 2023-08-07 DIAGNOSIS — R0789 Other chest pain: Secondary | ICD-10-CM | POA: Insufficient documentation

## 2023-08-07 DIAGNOSIS — F17200 Nicotine dependence, unspecified, uncomplicated: Secondary | ICD-10-CM | POA: Diagnosis not present

## 2023-08-07 DIAGNOSIS — I2510 Atherosclerotic heart disease of native coronary artery without angina pectoris: Secondary | ICD-10-CM | POA: Insufficient documentation

## 2023-08-07 DIAGNOSIS — E876 Hypokalemia: Secondary | ICD-10-CM | POA: Insufficient documentation

## 2023-08-07 LAB — CBC
HCT: 43.6 % (ref 39.0–52.0)
Hemoglobin: 14.7 g/dL (ref 13.0–17.0)
MCH: 29.9 pg (ref 26.0–34.0)
MCHC: 33.7 g/dL (ref 30.0–36.0)
MCV: 88.8 fL (ref 80.0–100.0)
Platelets: 201 10*3/uL (ref 150–400)
RBC: 4.91 MIL/uL (ref 4.22–5.81)
RDW: 13.9 % (ref 11.5–15.5)
WBC: 7.1 10*3/uL (ref 4.0–10.5)
nRBC: 0 % (ref 0.0–0.2)

## 2023-08-07 LAB — COMPREHENSIVE METABOLIC PANEL
ALT: 16 U/L (ref 0–44)
AST: 16 U/L (ref 15–41)
Albumin: 3.4 g/dL — ABNORMAL LOW (ref 3.5–5.0)
Alkaline Phosphatase: 56 U/L (ref 38–126)
Anion gap: 5 (ref 5–15)
BUN: 12 mg/dL (ref 6–20)
CO2: 23 mmol/L (ref 22–32)
Calcium: 7.3 mg/dL — ABNORMAL LOW (ref 8.9–10.3)
Chloride: 110 mmol/L (ref 98–111)
Creatinine, Ser: 1.29 mg/dL — ABNORMAL HIGH (ref 0.61–1.24)
GFR, Estimated: >60 mL/min (ref >60)
Glucose, Bld: 97 mg/dL (ref 70–99)
Potassium: 2.9 mmol/L — ABNORMAL LOW (ref 3.5–5.1)
Sodium: 138 mmol/L (ref 135–145)
Total Bilirubin: 0.5 mg/dL (ref 0.3–1.2)
Total Protein: 6.3 g/dL — ABNORMAL LOW (ref 6.5–8.1)

## 2023-08-07 LAB — TROPONIN I (HIGH SENSITIVITY): Troponin I (High Sensitivity): 4 ng/L (ref <18)

## 2023-08-07 NOTE — ED Notes (Signed)
Pt brought to Rm 4 by this EDT via WC. Pt hooked up to 5 lead cardiac monitor. Pt family at bedside. Nurse notified that PT is in room.

## 2023-08-07 NOTE — ED Triage Notes (Signed)
BIB [facility] from home. Central cp with radiation to L arm and jaw. Hx of MI last August and states symptoms feels the same. Pain onset acutely 45 min pta while pt watching tv.  Reports nausea. Denies emesis. Denies h/a, dizziness, vision change. Pt oriented and following commands.   EMS VS:  124/86 HR 82 NSR on EMS EKG 99% RA RR 20

## 2023-08-07 NOTE — ED Provider Notes (Signed)
[facility] Provider Note    Event Date/Time   First MD Initiated Contact with Patient 08/07/23 2329     (approximate)   History   No chief complaint on file.   HPI  Alex Brandt is a 47 y.o. male who presents to the ED for evaluation of No chief complaint on file.   Review of medical DC summary from 2 months ago.  History of CAD with an inferior STEMI requiring PCI /DES to RCA 1 year ago.  Continued tobacco abuse.  He was admitted for chest discomfort with negative troponins and CTA chest.  Started on Ranexa, Imdur stopped.  DAPT with Plavix. Most recent left heart cath in June of this year with moderate multivessel disease and mildly reduced EF, managed medically.  Patient presents to the ED after an episode of chest pain, not resolving.  Pain started tonight around 10 PM suddenly while he was seated.  Resolving with nitroglycerin and aspirin.   Physical Exam   Triage Vital Signs: ED Triage Vitals  Encounter Vitals Group     BP 08/07/23 2253 131/75     Systolic BP Percentile --      Diastolic BP Percentile --      Pulse Rate 08/07/23 2253 73     Resp 08/07/23 2253 18     Temp 08/07/23 2253 98.4 F (36.9 C)     Temp Source 08/07/23 2253 Oral     SpO2 08/07/23 2253 99 %     Weight 08/07/23 2251 220 lb (99.8 kg)     Height 08/07/23 2251 6\' 3"  (1.905 m)     Head Circumference --      Peak Flow --      Pain Score 08/07/23 2251 8     Pain Loc --      Pain Education --      Exclude from Growth Chart --     Most recent vital signs: Vitals:   08/08/23 0200 08/08/23 0230  BP: 112/71 109/75  Pulse: 61 63  Resp: 19 19  Temp:    SpO2: 98% 98%    General: Awake, no distress.  CV:  Good peripheral perfusion.  Resp:  Normal effort.  Abd:  No distention.  MSK:  No deformity noted.  Neuro:  No focal deficits appreciated. Other:     ED Results / Procedures / Treatments   Labs (all labs ordered are listed, but only abnormal results  are displayed) Labs Reviewed  COMPREHENSIVE METABOLIC PANEL - Abnormal; Notable for the following components:      Result Value   Potassium 2.9 (*)    Creatinine, Ser 1.29 (*)    Calcium 7.3 (*)    Total Protein 6.3 (*)    Albumin 3.4 (*)    All other components within normal limits  CBC  MAGNESIUM  TROPONIN I (HIGH SENSITIVITY)  TROPONIN I (HIGH SENSITIVITY)    EKG Sinus rhythm with a rate of 73 bpm.  Normal axis and intervals.  No clear signs of acute ischemia.  RADIOLOGY CXR interpreted by me without evidence of acute cardiopulmonary pathology.  Official radiology report(s): DG Chest 2 View  Result Date: 08/08/2023 CLINICAL DATA:  Chest pain EXAM: CHEST - 2 VIEW COMPARISON:  06/07/2023 FINDINGS: The heart size and mediastinal contours are within normal limits. Both lungs are clear. The visualized skeletal structures are unremarkable. IMPRESSION: No active cardiopulmonary disease. Electronically Signed   By: Jasmine Pang M.D.   On: 08/08/2023 02:24  PROCEDURES and INTERVENTIONS:  .1-3 Lead EKG Interpretation  Performed by: Delton Prairie, MD Authorized by: Delton Prairie, MD     Interpretation: normal     ECG rate:  66   ECG rate assessment: normal     Rhythm: sinus rhythm     Ectopy: none     Conduction: normal     Medications  potassium chloride SA (KLOR-CON M) CR tablet 40 mEq (40 mEq Oral Given 08/08/23 0109)  potassium chloride SA (KLOR-CON M) CR tablet 40 mEq (40 mEq Oral Given 08/08/23 0243)     IMPRESSION / MDM / ASSESSMENT AND PLAN / ED COURSE  I reviewed the triage vital signs and the nursing notes.  Differential diagnosis includes, but is not limited to, ACS, PTX, PNA, muscle strain/spasm, PE, dissection, anxiety, pleural effusion  {Patient presents with symptoms of an acute illness or injury that is potentially life-threatening.  Patient presents to the ED after an episode of resolving chest pain.  He is fairly high risk with multivessel CAD but has  a benign workup with a nonischemic EKG and 2 negative troponins.  Clear CXR.  Normal CBC.  Metabolic panel with hypokalemia that is replaced orally.  Normal magnesium.  I considered and offered observation admission but he would like to go home and considering he has been pain-free for over 4 hours with a benign workup I think this would be reasonable to try.  We discussed close follow-up and return precautions  Clinical Course as of 08/08/23 0309  Tue Aug 08, 2023  0244 Reassessed, patient remains pain-free.  Second troponin is negative and he wants to go home.  We discussed close follow-up and return precautions. [DS]    Clinical Course User Index [DS] Delton Prairie, MD     FINAL CLINICAL IMPRESSION(S) / ED DIAGNOSES   Final diagnoses:  Other chest pain  Hypokalemia     Rx / DC Orders   ED Discharge Orders     None        Note:  This document was prepared using Dragon voice recognition software and may include unintentional dictation errors.   Delton Prairie, MD 08/08/23 [phone]

## 2023-08-08 LAB — TROPONIN I (HIGH SENSITIVITY): Troponin I (High Sensitivity): 4 ng/L (ref <18)

## 2023-08-08 LAB — MAGNESIUM: Magnesium: 1.9 mg/dL (ref 1.7–2.4)

## 2023-08-08 MED ORDER — POTASSIUM CHLORIDE CRYS ER 20 MEQ PO TBCR
40.0000 meq | EXTENDED_RELEASE_TABLET | Freq: Once | ORAL | Status: AC
  Administered 2023-08-08: 40 meq via ORAL
  Filled 2023-08-08: qty 2

## 2023-09-26 ENCOUNTER — Emergency Department: Payer: Medicaid Other

## 2023-09-26 ENCOUNTER — Emergency Department
Admission: EM | Admit: 2023-09-26 | Discharge: 2023-09-26 | Discharge status: Against Medical Advice | Payer: Medicaid Other | Attending: Emergency Medicine | Admitting: Emergency Medicine

## 2023-09-26 ENCOUNTER — Encounter: Payer: Self-pay | Admitting: *Deleted

## 2023-09-26 ENCOUNTER — Other Ambulatory Visit: Payer: Self-pay

## 2023-09-26 DIAGNOSIS — Z5321 Procedure and treatment not carried out due to patient leaving prior to being seen by health care provider: Secondary | ICD-10-CM | POA: Insufficient documentation

## 2023-09-26 DIAGNOSIS — M542 Cervicalgia: Secondary | ICD-10-CM | POA: Insufficient documentation

## 2023-09-26 DIAGNOSIS — F17210 Nicotine dependence, cigarettes, uncomplicated: Secondary | ICD-10-CM | POA: Diagnosis not present

## 2023-09-26 DIAGNOSIS — M79602 Pain in left arm: Secondary | ICD-10-CM | POA: Diagnosis not present

## 2023-09-26 DIAGNOSIS — R0789 Other chest pain: Secondary | ICD-10-CM | POA: Insufficient documentation

## 2023-09-26 DIAGNOSIS — R0602 Shortness of breath: Secondary | ICD-10-CM | POA: Diagnosis not present

## 2023-09-26 LAB — CBC WITH DIFFERENTIAL/PLATELET
Abs Immature Granulocytes: 0.04 10*3/uL (ref 0.00–0.07)
Basophils Absolute: 0.1 10*3/uL (ref 0.0–0.1)
Basophils Relative: 1 %
Eosinophils Absolute: 0.2 10*3/uL (ref 0.0–0.5)
Eosinophils Relative: 2 %
HCT: 46.7 % (ref 39.0–52.0)
Hemoglobin: 15.5 g/dL (ref 13.0–17.0)
Immature Granulocytes: 0 %
Lymphocytes Relative: 28 %
Lymphs Abs: 2.5 10*3/uL (ref 0.7–4.0)
MCH: 29.7 pg (ref 26.0–34.0)
MCHC: 33.2 g/dL (ref 30.0–36.0)
MCV: 89.5 fL (ref 80.0–100.0)
Monocytes Absolute: 0.4 10*3/uL (ref 0.1–1.0)
Monocytes Relative: 5 %
Neutro Abs: 5.8 10*3/uL (ref 1.7–7.7)
Neutrophils Relative %: 64 %
Platelets: 255 10*3/uL (ref 150–400)
RBC: 5.22 MIL/uL (ref 4.22–5.81)
RDW: 13.9 % (ref 11.5–15.5)
WBC: 8.9 10*3/uL (ref 4.0–10.5)
nRBC: 0 % (ref 0.0–0.2)

## 2023-09-26 LAB — BASIC METABOLIC PANEL
Anion gap: 8 (ref 5–15)
BUN: 15 mg/dL (ref 6–20)
CO2: 26 mmol/L (ref 22–32)
Calcium: 9.1 mg/dL (ref 8.9–10.3)
Chloride: 104 mmol/L (ref 98–111)
Creatinine, Ser: 1.26 mg/dL — ABNORMAL HIGH (ref 0.61–1.24)
GFR, Estimated: >60 mL/min (ref >60)
Glucose, Bld: 100 mg/dL — ABNORMAL HIGH (ref 70–99)
Potassium: 4.1 mmol/L (ref 3.5–5.1)
Sodium: 138 mmol/L (ref 135–145)

## 2023-09-26 LAB — TROPONIN I (HIGH SENSITIVITY): Troponin I (High Sensitivity): 3 ng/L (ref <18)

## 2023-09-26 NOTE — ED Triage Notes (Signed)
Pt reports left side chest pain since this am.  Pt reports pain in laeft arm and left side of neck.  Pt reports sob.  Cig smoker.  No cough.  No n/v/d.  Pt alert   speech clear.

## 2023-09-26 NOTE — ED Provider Triage Note (Signed)
Emergency Medicine Provider Triage Evaluation Note  Alex Brandt , a 47 y.o. male  was evaluated in triage.  Pt complains of left sided chest pain that began this morning. History of heart attack about a year ago with 2 stents.  Review of Systems  Positive: CP, SOB Negative:   Physical Exam  Ht 6\' 4"  (1.93 m)   Wt 96.2 kg   BMI 25.81 kg/m  Gen:   Awake, no distress   Resp:  Normal effort  MSK:   Moves extremities without difficulty  Other:    Medical Decision Making  Medically screening exam initiated at 4:37 PM.  Appropriate orders placed.  Alex Brandt was informed that the remainder of the evaluation will be completed by another provider, this initial triage assessment does not replace that evaluation, and the importance of remaining in the ED until their evaluation is complete.     Cameron Ali, PA-C 09/26/23 1638

## 2023-10-05 ENCOUNTER — Emergency Department: Payer: Medicaid Other

## 2023-10-05 ENCOUNTER — Other Ambulatory Visit: Payer: Self-pay

## 2023-10-05 ENCOUNTER — Emergency Department
Admission: EM | Admit: 2023-10-05 | Discharge: 2023-10-05 | Disposition: A | Discharge status: Home / Self Care | Payer: Medicaid Other | Attending: Emergency Medicine | Admitting: Emergency Medicine

## 2023-10-05 DIAGNOSIS — M7981 Nontraumatic hematoma of soft tissue: Secondary | ICD-10-CM | POA: Insufficient documentation

## 2023-10-05 DIAGNOSIS — R0789 Other chest pain: Secondary | ICD-10-CM | POA: Diagnosis not present

## 2023-10-05 DIAGNOSIS — I10 Essential (primary) hypertension: Secondary | ICD-10-CM | POA: Insufficient documentation

## 2023-10-05 DIAGNOSIS — R079 Chest pain, unspecified: Secondary | ICD-10-CM | POA: Diagnosis present

## 2023-10-05 DIAGNOSIS — F17210 Nicotine dependence, cigarettes, uncomplicated: Secondary | ICD-10-CM | POA: Insufficient documentation

## 2023-10-05 LAB — BASIC METABOLIC PANEL
Anion gap: 10 (ref 5–15)
BUN: 15 mg/dL (ref 6–20)
CO2: 23 mmol/L (ref 22–32)
Calcium: 8.5 mg/dL — ABNORMAL LOW (ref 8.9–10.3)
Chloride: 101 mmol/L (ref 98–111)
Creatinine, Ser: 1.43 mg/dL — ABNORMAL HIGH (ref 0.61–1.24)
GFR, Estimated: >60 mL/min (ref >60)
Glucose, Bld: 113 mg/dL — ABNORMAL HIGH (ref 70–99)
Potassium: 3.8 mmol/L (ref 3.5–5.1)
Sodium: 134 mmol/L — ABNORMAL LOW (ref 135–145)

## 2023-10-05 LAB — CBC
HCT: 47.2 % (ref 39.0–52.0)
Hemoglobin: 15.7 g/dL (ref 13.0–17.0)
MCH: 30.3 pg (ref 26.0–34.0)
MCHC: 33.3 g/dL (ref 30.0–36.0)
MCV: 91.1 fL (ref 80.0–100.0)
Platelets: 222 10*3/uL (ref 150–400)
RBC: 5.18 MIL/uL (ref 4.22–5.81)
RDW: 13.8 % (ref 11.5–15.5)
WBC: 8 10*3/uL (ref 4.0–10.5)
nRBC: 0 % (ref 0.0–0.2)

## 2023-10-05 LAB — TROPONIN I (HIGH SENSITIVITY)
Troponin I (High Sensitivity): 5 ng/L (ref <18)
Troponin I (High Sensitivity): 5 ng/L (ref <18)

## 2023-10-05 NOTE — ED Notes (Signed)
Lab called at this time to check on results for BMP and troponin.

## 2023-10-05 NOTE — ED Triage Notes (Addendum)
Pt reports L sided, non-radiating CP since 0100. Mild SHOB. H/x MI in 2023. Significant cardiac history.

## 2023-10-05 NOTE — ED Provider Notes (Signed)
[facility] Provider Note    Event Date/Time   First MD Initiated Contact with Patient 10/05/23 0309     (approximate)   History   Chest Pain   HPI  Alex Brandt is a 47 y.o. male who presents to the ED for evaluation of Chest Pain   Review of cardiology clinic visit from 2 weeks ago.  STEMI with PCI and stent 1 year ago.  HTN, HLD.  DAPT.  Ranexa and Imdur.  Tobacco abuse.  Patient presents to the ED for evaluation of chest discomfort since 1 AM, now easing off and feeling better.  Reports he has had chest pain for the past year, this is a similar episode.  No novel features.  Reports feeling better now.  Does report an atraumatic bruise to his left calf and ankle and a "knot"  Continues to smoke at least 1 PPD  Physical Exam   Triage Vital Signs: ED Triage Vitals  Encounter Vitals Group     BP 10/05/23 0235 124/75     Systolic BP Percentile --      Diastolic BP Percentile --      Pulse Rate 10/05/23 0235 89     Resp 10/05/23 0235 18     Temp 10/05/23 0235 (!) 97.5 F (36.4 C)     Temp Source 10/05/23 0235 Oral     SpO2 10/05/23 0235 99 %     Weight 10/05/23 0236 220 lb (99.8 kg)     Height 10/05/23 0236 6\' 4"  (1.93 m)     Head Circumference --      Peak Flow --      Pain Score 10/05/23 0236 8     Pain Loc --      Pain Education --      Exclude from Growth Chart --     Most recent vital signs: Vitals:   10/05/23 0500 10/05/23 0530  BP: (!) 95/51 105/69  Pulse: 70 66  Resp:  18  Temp:    SpO2: 97% 99%    General: Awake, no distress.  Well-appearing and conversational CV:  Good peripheral perfusion.  Resp:  Normal effort.  Abd:  No distention.  MSK:  No deformity noted.  Small area of bruising to the medial left distal calf with a small palpable subcutaneous firm defect.  No erythema or infectious features.  No calf tenderness for approximately Neuro:  No focal deficits appreciated. Other:     ED Results / Procedures /  Treatments   Labs (all labs ordered are listed, but only abnormal results are displayed) Labs Reviewed  BASIC METABOLIC PANEL - Abnormal; Notable for the following components:      Result Value   Sodium 134 (*)    Glucose, Bld 113 (*)    Creatinine, Ser 1.43 (*)    Calcium 8.5 (*)    All other components within normal limits  CBC  TROPONIN I (HIGH SENSITIVITY)  TROPONIN I (HIGH SENSITIVITY)    EKG Sinus rhythm with a rate of 78 bpm.  Normal axis and intervals.  Nonspecific ST changes without clear signs of acute ischemia.  RADIOLOGY CXR interpreted by me without evidence of acute cardiopulmonary pathology.  Official radiology report(s): US Venous Img Lower Unilateral Left Result Date: 10/05/2023 CLINICAL DATA:  47 year old male with history of atraumatic bruising to the distal left calf. Evaluate for potential deep venous thrombosis. EXAM: LEFT LOWER EXTREMITY VENOUS DOPPLER ULTRASOUND TECHNIQUE: Gray-scale sonography with compression, as well as  color and duplex ultrasound, were performed to evaluate the deep venous system(s) from the level of the common femoral vein through the popliteal and proximal calf veins. COMPARISON:  None Available. FINDINGS: VENOUS Normal compressibility of the common femoral, superficial femoral, and popliteal veins, as well as the visualized calf veins. Visualized portions of profunda femoral vein and great saphenous vein unremarkable. No filling defects to suggest DVT on grayscale or color Doppler imaging. Doppler waveforms show normal direction of venous flow, normal respiratory plasticity and response to augmentation. Limited views of the contralateral common femoral vein are unremarkable. OTHER None. Limitations: none IMPRESSION: Negative. Electronically Signed   By: Trudie Reed M.D.   On: 10/05/2023 05:04   DG Chest 2 View Result Date: 10/05/2023 CLINICAL DATA:  Left-sided chest pain. EXAM: CHEST - 2 VIEW COMPARISON:  September 26, 2023 FINDINGS:  The heart size and mediastinal contours are within normal limits. Both lungs are clear. The visualized skeletal structures are unremarkable. IMPRESSION: No active cardiopulmonary disease. Electronically Signed   By: Aram Candela M.D.   On: 10/05/2023 03:06    PROCEDURES and INTERVENTIONS:  .1-3 Lead EKG Interpretation  Performed by: Delton Prairie, MD Authorized by: Delton Prairie, MD     Interpretation: normal     ECG rate:  90   ECG rate assessment: normal     Rhythm: sinus rhythm     Ectopy: none     Conduction: normal     Medications - No data to display   IMPRESSION / MDM / ASSESSMENT AND PLAN / ED COURSE  I reviewed the triage vital signs and the nursing notes.  Differential diagnosis includes, but is not limited to, ACS, PTX, PNA, muscle strain/spasm, PE, dissection, anxiety, pleural effusion  {Patient presents with symptoms of an acute illness or injury that is potentially life-threatening.  Patient with cardiac history who presents with an episode of atypical chest discomfort, improving here in the ED, with a benign workup and suitable for trial of outpatient management.  Looks well and reports waning chest discomfort.  EKG is nonischemic and his 2 negative troponins.  CKD near baseline and no normal CBC.  Clear CXR.  Has a small area of atraumatic bruising to his left lower leg, DVT study is obtained without signs of DVT.  I considered observation admission for this patient, but ultimately we decided on close outpatient cardiology follow-up with ED return precautions.  Clinical Course as of 10/05/23 0636  Thu Oct 05, 2023  0634 Reassessed.  Reports still feeling better.  We discussed 2 negative troponins, negative DVT study.  [DS]    Clinical Course User Index [DS] Delton Prairie, MD     FINAL CLINICAL IMPRESSION(S) / ED DIAGNOSES   Final diagnoses:  Other chest pain     Rx / DC Orders   ED Discharge Orders     None        Note:  This document was  prepared using Dragon voice recognition software and may include unintentional dictation errors.   Delton Prairie, MD 10/05/23 [phone]

## 2023-10-18 ENCOUNTER — Other Ambulatory Visit: Payer: Self-pay | Admitting: Medical Genetics

## 2024-01-07 ENCOUNTER — Other Ambulatory Visit: Payer: Self-pay

## 2024-01-07 ENCOUNTER — Emergency Department
Admission: EM | Admit: 2024-01-07 | Discharge: 2024-01-07 | Disposition: A | Discharge status: Home / Self Care | Attending: Student in an Organized Health Care Education/Training Program | Admitting: Student in an Organized Health Care Education/Training Program

## 2024-01-07 DIAGNOSIS — I2510 Atherosclerotic heart disease of native coronary artery without angina pectoris: Secondary | ICD-10-CM | POA: Insufficient documentation

## 2024-01-07 DIAGNOSIS — K920 Hematemesis: Secondary | ICD-10-CM | POA: Insufficient documentation

## 2024-01-07 LAB — COMPREHENSIVE METABOLIC PANEL WITH GFR
ALT: 16 U/L (ref 0–44)
AST: 18 U/L (ref 15–41)
Albumin: 3.9 g/dL (ref 3.5–5.0)
Alkaline Phosphatase: 61 U/L (ref 38–126)
Anion gap: 6 (ref 5–15)
BUN: 15 mg/dL (ref 6–20)
CO2: 26 mmol/L (ref 22–32)
Calcium: 8.9 mg/dL (ref 8.9–10.3)
Chloride: 106 mmol/L (ref 98–111)
Creatinine, Ser: 1.32 mg/dL — ABNORMAL HIGH (ref 0.61–1.24)
GFR, Estimated: >60 mL/min (ref >60)
Glucose, Bld: 131 mg/dL — ABNORMAL HIGH (ref 70–99)
Potassium: 4.4 mmol/L (ref 3.5–5.1)
Sodium: 138 mmol/L (ref 135–145)
Total Bilirubin: 0.6 mg/dL (ref 0.0–1.2)
Total Protein: 7.2 g/dL (ref 6.5–8.1)

## 2024-01-07 LAB — CBC
HCT: 46 % (ref 39.0–52.0)
Hemoglobin: 15.4 g/dL (ref 13.0–17.0)
MCH: 30.1 pg (ref 26.0–34.0)
MCHC: 33.5 g/dL (ref 30.0–36.0)
MCV: 90 fL (ref 80.0–100.0)
Platelets: 169 10*3/uL (ref 150–400)
RBC: 5.11 MIL/uL (ref 4.22–5.81)
RDW: 13.9 % (ref 11.5–15.5)
WBC: 7.3 10*3/uL (ref 4.0–10.5)
nRBC: 0 % (ref 0.0–0.2)

## 2024-01-07 LAB — TYPE AND SCREEN
ABO/RH(D): O POS
Antibody Screen: NEGATIVE

## 2024-01-07 LAB — PROTIME-INR
INR: 1.1 (ref 0.8–1.2)
Prothrombin Time: 14 s (ref 11.4–15.2)

## 2024-01-07 MED ORDER — SODIUM CHLORIDE 0.9 % IV BOLUS
500.0000 mL | Freq: Once | INTRAVENOUS | Status: AC
  Administered 2024-01-07: 500 mL via INTRAVENOUS

## 2024-01-07 MED ORDER — PANTOPRAZOLE SODIUM 40 MG IV SOLR
40.0000 mg | Freq: Once | INTRAVENOUS | Status: AC
  Administered 2024-01-07: 40 mg via INTRAVENOUS
  Filled 2024-01-07: qty 10

## 2024-01-07 MED ORDER — ONDANSETRON HCL 4 MG/2ML IJ SOLN
4.0000 mg | Freq: Once | INTRAMUSCULAR | Status: AC
  Administered 2024-01-07: 4 mg via INTRAVENOUS
  Filled 2024-01-07: qty 2

## 2024-01-07 NOTE — ED Provider Notes (Signed)
 [facility] Provider Note    Event Date/Time   First MD Initiated Contact with Patient 01/07/24 1111     (approximate)   History   Hematemesis   HPI  Alex Brandt is a 48 y.o. male history of smoking as well as CAD on Plavix on history of GERD on omeprazole presents to the ER for evaluation of episode of vomiting that was bloody.  Had 1 episode this morning after drinking some ginger ale was normal.  Then continue to feel sick in the second emesis was blood-tinged.  No coffee ground emesis.  No melena or hematochezia.  Denies any abdominal pain.  Describes metallic taste in his throat.  He did take his medications after having the emesis this morning.     Physical Exam   Triage Vital Signs: ED Triage Vitals  Encounter Vitals Group     BP 01/07/24 1040 123/78     Systolic BP Percentile --      Diastolic BP Percentile --      Pulse Rate 01/07/24 1040 71     Resp 01/07/24 1040 18     Temp 01/07/24 1040 98.2 F (36.8 C)     Temp Source 01/07/24 1040 Oral     SpO2 01/07/24 1040 100 %     Weight 01/07/24 1041 230 lb (104.3 kg)     Height 01/07/24 1041 6\' 4"  (1.93 m)     Head Circumference --      Peak Flow --      Pain Score 01/07/24 1041 0     Pain Loc --      Pain Education --      Exclude from Growth Chart --     Most recent vital signs: Vitals:   01/07/24 1040  BP: 123/78  Pulse: 71  Resp: 18  Temp: 98.2 F (36.8 C)  SpO2: 100%     Constitutional: Alert  Eyes: Conjunctivae are normal.  Head: Atraumatic. Nose: No congestion/rhinnorhea. Mouth/Throat: Mucous membranes are moist.   Neck: Painless ROM.  Cardiovascular:   Good peripheral circulation. Respiratory: Normal respiratory effort.  No retractions.  Gastrointestinal: Soft and nontender.  Musculoskeletal:  no deformity Neurologic:  MAE spontaneously. No gross focal neurologic deficits are appreciated.  Skin:  Skin is warm, dry and intact. No rash noted. Psychiatric:  Mood and affect are normal. Speech and behavior are normal.    ED Results / Procedures / Treatments   Labs (all labs ordered are listed, but only abnormal results are displayed) Labs Reviewed  COMPREHENSIVE METABOLIC PANEL WITH GFR - Abnormal; Notable for the following components:      Result Value   Glucose, Bld 131 (*)    Creatinine, Ser 1.32 (*)    All other components within normal limits  CBC  PROTIME-INR  POC OCCULT BLOOD, ED  TYPE AND SCREEN     EKG     RADIOLOGY    PROCEDURES:  Critical Care performed:   Procedures   MEDICATIONS ORDERED IN ED: Medications  pantoprazole (PROTONIX) injection 40 mg (40 mg Intravenous Given 01/07/24 1126)  ondansetron (ZOFRAN) injection 4 mg (4 mg Intravenous Given 01/07/24 1134)  sodium chloride 0.9 % bolus 500 mL (500 mLs Intravenous New Bag/Given 01/07/24 1133)     IMPRESSION / MDM / ASSESSMENT AND PLAN / ED COURSE  I reviewed the triage vital signs and the nursing notes.  Differential diagnosis includes, but is not limited to, PUD, esophagitis, Mallory-Weiss, Boerhaave's, UGIB  Patient presenting to the ER for evaluation of symptoms as described above.  Based on symptoms, risk factors and considered above differential, this presenting complaint could reflect a potentially life-threatening illness therefore the patient will be placed on continuous pulse oximetry and telemetry for monitoring.  Laboratory evaluation will be sent to evaluate for the above complaints.  Fortunately patient is clinically very well-appearing no leukocytosis.  Abdominal exam soft and benign.  No fever or hypotension.  His hemoglobin is completely normal.  BUN normal.  Will give some symptomatic treatment observe.  More likely Mallory-Weiss or mild esophagitis given his exam as he is on Plavix will observe for any evidence of persistent or additional bleeding.   Clinical Course as of 01/07/24 1339  Sun Jan 07, 2024  1337  Patient reassessed.  Remains well-appearing is tolerating p.o.  Exam is reassuring.  No additional episodes of emesis nausea or hematemesis.  Given his otherwise reassuring workup and reassuring exam at this point I do believe he stable and appropriate for outpatient follow-up.  I will give referral to GI.  Instructed to increase his PPI. [PR]    Clinical Course User Index [PR] Willy Eddy, MD     FINAL CLINICAL IMPRESSION(S) / ED DIAGNOSES   Final diagnoses:  Hematemesis with nausea     Rx / DC Orders   ED Discharge Orders     None        Note:  This document was prepared using Dragon voice recognition software and may include unintentional dictation errors.    Willy Eddy, MD 01/07/24 [phone]

## 2024-01-07 NOTE — ED Triage Notes (Signed)
 Pt sts that he has been vomiting bright red blood this AM. Pt sts that he is on plavix.

## 2024-02-08 ENCOUNTER — Encounter: Payer: Self-pay | Admitting: Gastroenterology

## 2024-02-09 ENCOUNTER — Encounter: Payer: Self-pay | Admitting: Gastroenterology

## 2024-02-16 ENCOUNTER — Encounter
Admission: RE | Disposition: A | Discharge status: Home / Self Care | Payer: Self-pay | Source: Home / Self Care | Attending: Gastroenterology

## 2024-02-16 ENCOUNTER — Ambulatory Visit: Admitting: Anesthesiology

## 2024-02-16 ENCOUNTER — Ambulatory Visit
Admission: RE | Admit: 2024-02-16 | Discharge: 2024-02-16 | Disposition: A | Discharge status: Home / Self Care | Attending: Gastroenterology | Admitting: Gastroenterology

## 2024-02-16 ENCOUNTER — Encounter: Payer: Self-pay | Admitting: Gastroenterology

## 2024-02-16 ENCOUNTER — Other Ambulatory Visit: Payer: Self-pay

## 2024-02-16 DIAGNOSIS — Z79899 Other long term (current) drug therapy: Secondary | ICD-10-CM | POA: Insufficient documentation

## 2024-02-16 DIAGNOSIS — K2950 Unspecified chronic gastritis without bleeding: Secondary | ICD-10-CM | POA: Diagnosis not present

## 2024-02-16 DIAGNOSIS — I2510 Atherosclerotic heart disease of native coronary artery without angina pectoris: Secondary | ICD-10-CM | POA: Diagnosis not present

## 2024-02-16 DIAGNOSIS — I252 Old myocardial infarction: Secondary | ICD-10-CM | POA: Diagnosis not present

## 2024-02-16 DIAGNOSIS — Z9049 Acquired absence of other specified parts of digestive tract: Secondary | ICD-10-CM | POA: Diagnosis not present

## 2024-02-16 DIAGNOSIS — Z7902 Long term (current) use of antithrombotics/antiplatelets: Secondary | ICD-10-CM | POA: Diagnosis not present

## 2024-02-16 DIAGNOSIS — I10 Essential (primary) hypertension: Secondary | ICD-10-CM | POA: Diagnosis not present

## 2024-02-16 DIAGNOSIS — F17200 Nicotine dependence, unspecified, uncomplicated: Secondary | ICD-10-CM | POA: Diagnosis not present

## 2024-02-16 DIAGNOSIS — Z1211 Encounter for screening for malignant neoplasm of colon: Secondary | ICD-10-CM | POA: Diagnosis present

## 2024-02-16 DIAGNOSIS — K621 Rectal polyp: Secondary | ICD-10-CM | POA: Insufficient documentation

## 2024-02-16 DIAGNOSIS — K920 Hematemesis: Secondary | ICD-10-CM | POA: Diagnosis present

## 2024-02-16 DIAGNOSIS — K219 Gastro-esophageal reflux disease without esophagitis: Secondary | ICD-10-CM | POA: Insufficient documentation

## 2024-02-16 HISTORY — PX: ESOPHAGOGASTRODUODENOSCOPY: SHX5428

## 2024-02-16 HISTORY — DX: Acute myocardial infarction, unspecified: I21.9

## 2024-02-16 HISTORY — PX: COLONOSCOPY: SHX5424

## 2024-02-16 SURGERY — COLONOSCOPY
Anesthesia: General

## 2024-02-16 MED ORDER — LIDOCAINE HCL (PF) 2 % IJ SOLN
INTRAMUSCULAR | Status: AC
  Filled 2024-02-16: qty 5

## 2024-02-16 MED ORDER — PROPOFOL 500 MG/50ML IV EMUL
INTRAVENOUS | Status: DC | PRN
  Administered 2024-02-16: 125 ug/kg/min via INTRAVENOUS

## 2024-02-16 MED ORDER — GLYCOPYRROLATE 0.2 MG/ML IJ SOLN
INTRAMUSCULAR | Status: DC | PRN
  Administered 2024-02-16: .2 mg via INTRAVENOUS

## 2024-02-16 MED ORDER — PROPOFOL 10 MG/ML IV BOLUS
INTRAVENOUS | Status: DC | PRN
  Administered 2024-02-16 (×2): 50 mg via INTRAVENOUS

## 2024-02-16 MED ORDER — LIDOCAINE HCL (CARDIAC) PF 100 MG/5ML IV SOSY
PREFILLED_SYRINGE | INTRAVENOUS | Status: DC | PRN
  Administered 2024-02-16: 80 mg via INTRAVENOUS

## 2024-02-16 MED ORDER — DEXMEDETOMIDINE HCL IN NACL 80 MCG/20ML IV SOLN
INTRAVENOUS | Status: DC | PRN
  Administered 2024-02-16: 20 ug via INTRAVENOUS

## 2024-02-16 MED ORDER — GLYCOPYRROLATE 0.2 MG/ML IJ SOLN
INTRAMUSCULAR | Status: AC
Start: 2024-02-16 — End: ?
  Filled 2024-02-16: qty 1

## 2024-02-16 MED ORDER — SODIUM CHLORIDE 0.9 % IV SOLN: INTRAVENOUS | Status: DC

## 2024-02-16 NOTE — Interval H&P Note (Signed)
 History and Physical Interval Note: Preprocedure H&P from 02/16/24  was reviewed and there was no interval change after seeing and examining the patient.  Written consent was obtained from the patient after discussion of risks, benefits, and alternatives. Patient has consented to proceed with Esophagogastroduodenoscopy and Colonoscopy with possible intervention   02/16/2024 1:07 PM  Alex Brandt  has presented today for surgery, with the diagnosis of Hematemesis without nausea (K92.0) GERD without esophagitis (K21.9) Colon cancer screening (Z12.11).  The various methods of treatment have been discussed with the patient and family. After consideration of risks, benefits and other options for treatment, the patient has consented to  Procedure(s): COLONOSCOPY (N/A) EGD (ESOPHAGOGASTRODUODENOSCOPY) (N/A) as a surgical intervention.  The patient's history has been reviewed, patient examined, no change in status, stable for surgery.  I have reviewed the patient's chart and labs.  Questions were answered to the patient's satisfaction.     Quintin Buckle

## 2024-02-16 NOTE — Op Note (Signed)
 [facility] Gastroenterology Patient Name: Alex Brandt Procedure Date: 02/16/2024 1:05 PM MRN: [id number] Account #: [id number] Date of Birth: 1976/04/17 Admit Type: Outpatient Age: 48 Room: [facility] ENDO ROOM 1 Gender: Male Note Status: Supervisor Override Instrument Name: Almyra Jain [id number] Procedure:             Upper GI endoscopy Indications:           Gastro-esophageal reflux disease, Hematemesis Providers:             Quintin Buckle DO, DO Medicines:             Monitored Anesthesia Care Complications:         No immediate complications. Estimated blood loss:                         Minimal. Procedure:             Pre-Anesthesia Assessment:                        - Prior to the procedure, a History and Physical was                         performed, and patient medications and allergies were                         reviewed. The patient is competent. The risks and                         benefits of the procedure and the sedation options and                         risks were discussed with the patient. All questions                         were answered and informed consent was obtained.                         Patient identification and proposed procedure were                         verified by the physician, the nurse, the anesthetist                         and the technician in the endoscopy suite. Mental                         Status Examination: alert and oriented. Airway                         Examination: normal oropharyngeal airway and neck                         mobility. Respiratory Examination: clear to                         auscultation. CV Examination: RRR, no murmurs, no S3                         or S4. Prophylactic Antibiotics: The patient  does not                         require prophylactic antibiotics. Prior                         Anticoagulants: The patient has taken Plavix                          (clopidogrel ), last  dose was 5 days prior to                         procedure. ASA Grade Assessment: III - A patient with                         severe systemic disease. After reviewing the risks and                         benefits, the patient was deemed in satisfactory                         condition to undergo the procedure. The anesthesia                         plan was to use monitored anesthesia care (MAC).                         Immediately prior to administration of medications,                         the patient was re-assessed for adequacy to receive                         sedatives. The heart rate, respiratory rate, oxygen                         saturations, blood pressure, adequacy of pulmonary                         ventilation, and response to care were monitored                         throughout the procedure. The physical status of the                         patient was re-assessed after the procedure.                        After obtaining informed consent, the endoscope was                         passed under direct vision. Throughout the procedure,                         the patient's blood pressure, pulse, and oxygen                         saturations were monitored continuously. The Endoscope  was introduced through the mouth, and advanced to the                         third part of duodenum. The upper GI endoscopy was                         accomplished without difficulty. The patient tolerated                         the procedure well. Findings:      The duodenal bulb, first portion of the duodenum, second portion of the       duodenum and third portion of the duodenum were normal. Estimated blood       loss: none.      The Z-line was regular. Estimated blood loss: none.      Esophagogastric landmarks were identified: the gastroesophageal junction       was found at 45 cm from the incisors.      The examined esophagus was normal. Estimated  blood loss: none.      Diffuse mild inflammation characterized by erythema was found in the       gastric body and in the gastric antrum. Biopsies were taken with a cold       forceps for Helicobacter pylori testing. Estimated blood loss was       minimal.      The exam of the stomach was otherwise normal. Impression:            - Normal duodenal bulb, first portion of the duodenum,                         second portion of the duodenum and third portion of                         the duodenum.                        - Z-line regular.                        - Esophagogastric landmarks identified.                        - Normal esophagus.                        - Gastritis. Biopsied. Recommendation:        - Patient has a contact number available for                         emergencies. The signs and symptoms of potential                         delayed complications were discussed with the patient.                         Return to normal activities tomorrow. Written                         discharge instructions were provided to the patient.                        -  Discharge patient to home.                        - Resume previous diet.                        - Continue present medications.                        - Await pathology results.                        - can decrease proton pump inhibitor down to once a                         day. Should be on pantoprazole  over others due to                         lower interactions with plavix .                        - see colonoscopy report for further recommendations.                        - Return to GI clinic as previously scheduled.                        - The findings and recommendations were discussed with                         the patient. Procedure Code(s):     --- Professional ---                        [phone], Esophagogastroduodenoscopy, flexible,                         transoral; with biopsy, single or  multiple Diagnosis Code(s):     --- Professional ---                        K29.70, Gastritis, unspecified, without bleeding                        K92.0, Hematemesis CPT copyright 2022 [employer]. All rights reserved. The codes documented in this report are preliminary and upon coder review may  be revised to meet current compliance requirements. Attending Participation:      I personally performed the entire procedure. Polo Brisk, DO Quintin Buckle DO, DO 02/16/2024 1:26:21 PM This report has been signed electronically. Number of Addenda: 0 Note Initiated On: 02/16/2024 1:05 PM Estimated Blood Loss:  Estimated blood loss was minimal.      [facility]

## 2024-02-16 NOTE — Op Note (Signed)
 [facility] Gastroenterology Patient Name: Alex Brandt Procedure Date: 02/16/2024 1:05 PM MRN: [id number] Account #: [id number] Date of Birth: 1976-06-05 Admit Type: Outpatient Age: 48 Room: [facility] ENDO ROOM 1 Gender: Male Note Status: Finalized Instrument Name: Colonscope [id number] Procedure:             Colonoscopy Indications:           Screening for colorectal malignant neoplasm Providers:             Quintin Buckle DO, DO Medicines:             Monitored Anesthesia Care Complications:         No immediate complications. Estimated blood loss:                         Minimal. Procedure:             Pre-Anesthesia Assessment:                        - Prior to the procedure, a History and Physical was                         performed, and patient medications and allergies were                         reviewed. The patient is competent. The risks and                         benefits of the procedure and the sedation options and                         risks were discussed with the patient. All questions                         were answered and informed consent was obtained.                         Patient identification and proposed procedure were                         verified by the physician, the nurse, the anesthetist                         and the technician in the endoscopy suite. Mental                         Status Examination: alert and oriented. Airway                         Examination: normal oropharyngeal airway and neck                         mobility. Respiratory Examination: clear to                         auscultation. CV Examination: RRR, no murmurs, no S3                         or S4. Prophylactic Antibiotics: The patient does not  require prophylactic antibiotics. Prior                         Anticoagulants: The patient has taken Plavix                          (clopidogrel ), last dose was 5 days prior to                          procedure. ASA Grade Assessment: III - A patient with                         severe systemic disease. After reviewing the risks and                         benefits, the patient was deemed in satisfactory                         condition to undergo the procedure. The anesthesia                         plan was to use monitored anesthesia care (MAC).                         Immediately prior to administration of medications,                         the patient was re-assessed for adequacy to receive                         sedatives. The heart rate, respiratory rate, oxygen                         saturations, blood pressure, adequacy of pulmonary                         ventilation, and response to care were monitored                         throughout the procedure. The physical status of the                         patient was re-assessed after the procedure.                        After obtaining informed consent, the colonoscope was                         passed under direct vision. Throughout the procedure,                         the patient's blood pressure, pulse, and oxygen                         saturations were monitored continuously. The                         Colonoscope was introduced through the anus and  advanced to the the cecum, identified by appendiceal                         orifice and ileocecal valve. The colonoscopy was                         performed without difficulty. The patient tolerated                         the procedure well. The quality of the bowel                         preparation was evaluated using the BBPS [facility] Bowel                         Preparation Scale) with scores of: Right Colon = 1                         (portion of mucosa seen, but other areas not well seen                         due to staining, residual stool and/or opaque liquid),                         Transverse Colon = 1  (portion of mucosa seen, but                         other areas not well seen due to staining, residual                         stool and/or opaque liquid) and Left Colon = 2 (minor                         amount of residual staining, small fragments of stool                         and/or opaque liquid, but mucosa seen well). The total                         BBPS score equals 4. The quality of the bowel                         preparation was inadequate. The ileocecal valve,                         appendiceal orifice, and rectum were photographed. Findings:      The perianal and digital rectal examinations were normal. Pertinent       negatives include normal sphincter tone.      Four sessile polyps were found in the rectum. The polyps were 1 to 2 mm       in size. These polyps were removed with a jumbo cold forceps. Resection       and retrieval were complete. Estimated blood loss was minimal.      Extensive amounts of semi-solid stool was found in the descending colon,       in the transverse colon, in the ascending colon  and in the cecum,       precluding visualization. Lavage of the area was performed, resulting in       incomplete clearance with continued poor visualization. Estimated blood       loss: none.      The exam was otherwise without abnormality on direct and retroflexion       views. Impression:            - Preparation of the colon was inadequate.                        - Four 1 to 2 mm polyps in the rectum, removed with a                         jumbo cold forceps. Resected and retrieved.                        - Stool in the descending colon, in the transverse                         colon, in the ascending colon and in the cecum.                        - The examination was otherwise normal on direct and                         retroflexion views. Recommendation:        - Patient has a contact number available for                         emergencies. The signs and  symptoms of potential                         delayed complications were discussed with the patient.                         Return to normal activities tomorrow. Written                         discharge instructions were provided to the patient.                        - Discharge patient to home.                        - Resume previous diet.                        - Continue present medications.                        - Await pathology results.                        - Repeat colonoscopy 6-12 months because the bowel                         preparation was poor.                        -  Resume Plavix  (clopidogrel ) at prior dose tomorrow.                         Refer to managing physician for further adjustment of                         therapy.                        - The findings and recommendations were discussed with                         the patient.                        - Return to GI office as previously scheduled. Procedure Code(s):     --- Professional ---                        [phone], Colonoscopy, flexible; with biopsy, single or                         multiple Diagnosis Code(s):     --- Professional ---                        Z12.11, Encounter for screening for malignant neoplasm                         of colon                        D12.8, Benign neoplasm of rectum CPT copyright 2022 American Medical Association. All rights reserved. The codes documented in this report are preliminary and upon coder review may  be revised to meet current compliance requirements. Attending Participation:      I personally performed the entire procedure. Polo Brisk, DO Quintin Buckle DO, DO 02/16/2024 1:42:00 PM This report has been signed electronically. Number of Addenda: 0 Note Initiated On: 02/16/2024 1:05 PM Scope Withdrawal Time: 0 hours 8 minutes 19 seconds  Total Procedure Duration: 0 hours 10 minutes 17 seconds  Estimated Blood Loss:  Estimated blood loss was  minimal.      [facility]

## 2024-02-16 NOTE — Anesthesia Preprocedure Evaluation (Signed)
 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 Your chest pain has improved with metoprolol , but you should increase the dose to 50 mg daily. Continue using sublingual nitroglycerin  as needed. If symptoms persist, we may consider adding ranolazine  and plan for potential catheterization after your GI procedures.  -CARDIOVASCULAR RISK REDUCTION: Cardiovascular risk reduction involves managing factors that can lead to heart disease. You are currently on Rosuvastatin  and Zetia for cholesterol management. We will check your cholesterol levels today to see how well the medications are working.  -PALPITATIONS: Palpitations are feelings of having a fast-beating, fluttering, or pounding heart. Your cardiac event monitor showed one brief episode of arrhythmia, but the results are overall reassuring.  -GASTROINTESTINAL BLEEDING: Gastrointestinal bleeding refers to any bleeding that starts in the gastrointestinal tract. You reported vomiting blood and are scheduled for a GI endoscopy to investigate. Stop taking Plavix  5 days before the procedure, but continue aspirin  if allowed by the GI team.  We will reassess your need for cardiac interventions after the GI procedures.  INSTRUCTIONS:  Please proceed with your scheduled upper and lower GI endoscopy on May 8th or 9th. Stop taking Plavix  5 days before the procedure, but continue aspirin  if the GI team allows it. We will reassess your need for cardiac interventions after the GI procedures.    Reproductive/Obstetrics                              Anesthesia Physical Anesthesia Plan  ASA: 3  Anesthesia Plan: General   Post-op Pain Management:    Induction:   PONV Risk Score and Plan:   Airway Management Planned:   Additional Equipment:   Intra-op Plan:   Post-operative Plan:   Informed Consent:   Plan Discussed with:   Anesthesia Plan Comments:          Anesthesia Quick Evaluation

## 2024-02-16 NOTE — Transfer of Care (Signed)
 Immediate Anesthesia Transfer of Care Note  Patient: NATHANIAL RIESEN  Procedure(s) Performed: COLONOSCOPY EGD (ESOPHAGOGASTRODUODENOSCOPY)  Patient Location: PACU  Anesthesia Type:General  Level of Consciousness: awake, alert , and drowsy  Airway & Oxygen Therapy: Patient Spontanous Breathing  Post-op Assessment: Report given to RN and Post -op Vital signs reviewed and stable  Post vital signs: Reviewed and stable  Last Vitals:  Vitals Value Taken Time  BP 93/55 02/16/24 1344  Temp 35.9 1344  Pulse 77 02/16/24 1344  Resp 20 02/16/24 1344  SpO2 100 % 02/16/24 1344  Vitals shown include unfiled device data.  Last Pain:  Vitals:   02/16/24 1157  TempSrc: Temporal  PainSc: 0-No pain         Complications: No notable events documented.

## 2024-02-16 NOTE — H&P (Signed)
 Pre-Procedure H&P   Patient ID: Alex Brandt is a 48 y.o. male.  Gastroenterology Provider: Quintin Buckle, DO  Referring Provider: Jamee Mazzoni, PA PCP: [facility]  Date: 02/16/2024  HPI Alex Brandt is a 48 y.o. male who presents today for Esophagogastroduodenoscopy and Colonoscopy for Hematemesis; GERD; colorectal cancer screening .  Patient experienced hematemesis in March and was seen in the emergency department.  Hemoglobin was stable but 15 with a normal BUN/creatinine ratio.  He was sent out with twice a day omeprazole.  He was seen in the GI office and set up for bidirectional endoscopy.  Denies any dysphagia odynophagia hematemesis coffee-ground emesis melena or hematochezia since his episode.  Active tobacco use.  Plavix  has been held for the procedure.  Status post cholecystectomy  Creatinine 1.32 hemoglobin 15.4 MCV 90 platelets 169,000  History of STEMI August 2023   Past Medical History:  Diagnosis Date   Angina at rest [facility])    Arthritis    Coronary artery disease 05/05/2021   a.) LHC 05/05/2021: EF 55-65%; normal LV function and LVEDP; 50% mLAD, 50% D1, 50% p-mRCA; med mgmt.   DJD (degenerative joint disease) of knee    Dyspnea    GERD (gastroesophageal reflux disease)    HLD (hyperlipidemia)    Hyperkalemia    Hypertension    Migraines    Myocardial infarction [facility])    Valvular insufficiency 12/18/2014   a.) TTE 12/18/2014: EF >55%; mild PR, moderate MR/TR.    Past Surgical History:  Procedure Laterality Date   APPENDECTOMY     CARDIAC CATHETERIZATION     CHOLECYSTECTOMY     COLONOSCOPY     CORONARY/GRAFT ACUTE MI REVASCULARIZATION N/A 06/02/2022   Procedure: Coronary/Graft Acute MI Revascularization;  Surgeon: Antonette Batters, MD;  Location: [facility] INVASIVE CV LAB;  Service: Cardiovascular;  Laterality: N/A;   ESOPHAGOGASTRODUODENOSCOPY     JOINT REPLACEMENT     KNEE ARTHROSCOPY WITH MEDIAL  MENISECTOMY Right 08/30/2021   Procedure: Right knee arthroscopic partial medial meniscectomy;  Surgeon: Lorri Rota, MD;  Location: [facility] ORS;  Service: Orthopedics;  Laterality: Right;   KNEE SURGERY Right    meniscus repair in [place] and again at [facility]   LEFT HEART CATH AND CORONARY ANGIOGRAPHY Left 05/05/2021   Procedure: LEFT HEART CATH AND CORONARY ANGIOGRAPHY;  Surgeon: Antonette Batters, MD;  Location: [facility] INVASIVE CV LAB;  Service: Cardiovascular;  Laterality: Left;   LEFT HEART CATH AND CORONARY ANGIOGRAPHY N/A 06/02/2022   Procedure: LEFT HEART CATH AND CORONARY ANGIOGRAPHY;  Surgeon: Antonette Batters, MD;  Location: [facility] INVASIVE CV LAB;  Service: Cardiovascular;  Laterality: N/A;   LEFT HEART CATH AND CORONARY ANGIOGRAPHY Right 03/13/2023   Procedure: LEFT HEART CATH AND CORONARY ANGIOGRAPHY with possible intervention;  Surgeon: Antonette Batters, MD;  Location: [facility] INVASIVE CV LAB;  Service: Cardiovascular;  Laterality: Right;   PARTIAL KNEE ARTHROPLASTY Right 02/15/2022   Procedure: UNICOMPARTMENTAL KNEE;  Surgeon: Elner Hahn, MD;  Location: [facility] ORS;  Service: Orthopedics;  Laterality: Right;    Family History No h/o GI disease or malignancy  Review of Systems  Constitutional:  Negative for activity change, appetite change, chills, diaphoresis, fatigue, fever and unexpected weight change.  HENT:  Negative for trouble swallowing and voice change.   Respiratory:  Negative for shortness of breath and wheezing.   Cardiovascular:  Negative for chest pain, palpitations and leg swelling.  Gastrointestinal:  Negative for abdominal distention,  abdominal pain, anal bleeding, blood in stool, constipation, diarrhea, nausea and vomiting.  Musculoskeletal:  Negative for arthralgias and myalgias.  Skin:  Negative for color change and pallor.  Neurological:  Negative for dizziness, syncope and weakness.  Psychiatric/Behavioral:  Negative for confusion. The patient is not  nervous/anxious.   All other systems reviewed and are negative.    Medications No current facility-administered medications on file prior to encounter.   Current Outpatient Medications on File Prior to Encounter  Medication Sig Dispense Refill   acetaminophen  (TYLENOL ) 325 MG tablet Take 2 tablets (650 mg total) by mouth every 6 (six) hours as needed for mild pain, fever or headache (or Fever >/= 101). 20 tablet 1   aspirin  EC 81 MG tablet Take 81 mg by mouth daily.     clopidogrel  (PLAVIX ) 75 MG tablet Take 75 mg by mouth daily.     omeprazole (PRILOSEC) 40 MG capsule Take 40 mg by mouth daily.     rosuvastatin  (CRESTOR ) 40 MG tablet Take 40 mg by mouth at bedtime.     nitroGLYCERIN  (NITROSTAT ) 0.4 MG SL tablet Place 0.4 mg under the tongue every 5 (five) minutes as needed for chest pain.     ranolazine  (RANEXA ) 500 MG 12 hr tablet Take 1 tablet (500 mg total) by mouth 2 (two) times daily. 60 tablet 0    Pertinent medications related to GI and procedure were reviewed by me with the patient prior to the procedure   Current Facility-Administered Medications:    0.9 %  sodium chloride  infusion, , Intravenous, Continuous, Quintin Buckle, DO, Last Rate: 20 mL/hr at 02/16/24 1209, New Bag at 02/16/24 1209  sodium chloride  20 mL/hr at 02/16/24 1209       Allergies  Allergen Reactions   Shellfish Allergy Anaphylaxis and Swelling   Hydrocodone  Nausea And Vomiting   No [employer] Food Allergies Swelling    The pt is allergic to tea ex. Like sweet tea to drink   Toradol  [Ketorolac  Tromethamine ] Other (See Comments)    Patient states he cannot take it with one of his heart medications   Tramadol Other (See Comments)    Migraines   Tramadol Hcl Other (See Comments)   Allergies were reviewed by me prior to the procedure  Objective   Body mass index is 26.02 kg/m. Vitals:   02/16/24 1157  BP: 107/70  Pulse: 75  Resp: 18  Temp: (!) 97.3 F (36.3 C)  TempSrc: Temporal   SpO2: 100%  Weight: 97 kg  Height: 6\' 4"  (1.93 m)     Physical Exam Vitals and nursing note reviewed.  Constitutional:      General: He is not in acute distress.    Appearance: Normal appearance. He is not ill-appearing, toxic-appearing or diaphoretic.  HENT:     Head: Normocephalic and atraumatic.     Nose: Nose normal.     Mouth/Throat:     Mouth: Mucous membranes are moist.     Pharynx: Oropharynx is clear.  Eyes:     General: No scleral icterus.    Extraocular Movements: Extraocular movements intact.  Cardiovascular:     Rate and Rhythm: Normal rate and regular rhythm.     Heart sounds: Normal heart sounds. No murmur heard.    No friction rub. No gallop.  Pulmonary:     Effort: Pulmonary effort is normal. No respiratory distress.     Breath sounds: Normal breath sounds. No wheezing, rhonchi or rales.  Abdominal:  General: Bowel sounds are normal. There is no distension.     Palpations: Abdomen is soft.     Tenderness: There is no abdominal tenderness. There is no guarding or rebound.  Musculoskeletal:     Cervical back: Neck supple.     Right lower leg: No edema.     Left lower leg: No edema.  Skin:    General: Skin is warm and dry.     Coloration: Skin is not jaundiced or pale.  Neurological:     General: No focal deficit present.     Mental Status: He is alert and oriented to person, place, and time. Mental status is at baseline.  Psychiatric:        Mood and Affect: Mood normal.        Behavior: Behavior normal.        Thought Content: Thought content normal.        Judgment: Judgment normal.      Assessment:  Alex Brandt is a 48 y.o. male  who presents today for Esophagogastroduodenoscopy and Colonoscopy for Hematemesis; GERD; colorectal cancer screening .  Plan:  Esophagogastroduodenoscopy and Colonoscopy with possible intervention today  Esophagogastroduodenoscopy and Colonoscopy with possible biopsy, control of bleeding, polypectomy,  and interventions as necessary has been discussed with the patient/patient representative. Informed consent was obtained from the patient/patient representative after explaining the indication, nature, and risks of the procedure including but not limited to death, bleeding, perforation, missed neoplasm/lesions, cardiorespiratory compromise, and reaction to medications. Opportunity for questions was given and appropriate answers were provided. Patient/patient representative has verbalized understanding is amenable to undergoing the procedure.   Quintin Buckle, DO  [facility] Gastroenterology  Portions of the record may have been created with voice recognition software. Occasional wrong-word or 'sound-a-like' substitutions may have occurred due to the inherent limitations of voice recognition software.  Read the chart carefully and recognize, using context, where substitutions may have occurred.

## 2024-02-19 ENCOUNTER — Encounter: Payer: Self-pay | Admitting: Gastroenterology

## 2024-02-19 LAB — SURGICAL PATHOLOGY

## 2024-02-19 NOTE — Anesthesia Postprocedure Evaluation (Signed)
 Anesthesia Post Note  Patient: Alex Brandt  Procedure(s) Performed: COLONOSCOPY EGD (ESOPHAGOGASTRODUODENOSCOPY)  Patient location during evaluation: PACU Anesthesia Type: General Level of consciousness: awake and alert, oriented and patient cooperative Pain management: pain level controlled Vital Signs Assessment: post-procedure vital signs reviewed and stable Respiratory status: spontaneous breathing, nonlabored ventilation and respiratory function stable Cardiovascular status: blood pressure returned to baseline and stable Postop Assessment: adequate PO intake Anesthetic complications: no   No notable events documented.   Last Vitals:  Vitals:   02/16/24 1402 02/16/24 1407  BP:  (!) 88/71  Pulse: 60 61  Resp: 13 19  Temp:    SpO2: 99% 100%    Last Pain:  Vitals:   02/16/24 1407  TempSrc:   PainSc: 0-No pain                 Dorothey Gate

## 2024-05-18 DIAGNOSIS — I252 Old myocardial infarction: Secondary | ICD-10-CM | POA: Insufficient documentation

## 2024-05-18 DIAGNOSIS — F17200 Nicotine dependence, unspecified, uncomplicated: Secondary | ICD-10-CM | POA: Insufficient documentation

## 2024-05-19 DIAGNOSIS — N182 Chronic kidney disease, stage 2 (mild): Secondary | ICD-10-CM | POA: Insufficient documentation

## 2024-06-11 ENCOUNTER — Encounter: Attending: Cardiology | Admitting: *Deleted

## 2024-06-11 DIAGNOSIS — Z48812 Encounter for surgical aftercare following surgery on the circulatory system: Secondary | ICD-10-CM | POA: Insufficient documentation

## 2024-06-11 DIAGNOSIS — I2510 Atherosclerotic heart disease of native coronary artery without angina pectoris: Secondary | ICD-10-CM | POA: Insufficient documentation

## 2024-06-11 DIAGNOSIS — Z9861 Coronary angioplasty status: Secondary | ICD-10-CM | POA: Insufficient documentation

## 2024-06-11 NOTE — Progress Notes (Signed)
 Initial phone call completed. Diagnosis can be found in [facility] 05/18/24. EP Orientation scheduled for Wednesday 9/3 at 8am.

## 2024-06-12 ENCOUNTER — Encounter

## 2024-06-12 VITALS — Ht 75.8 in | Wt 230.2 lb

## 2024-06-12 DIAGNOSIS — Z48812 Encounter for surgical aftercare following surgery on the circulatory system: Secondary | ICD-10-CM | POA: Diagnosis present

## 2024-06-12 DIAGNOSIS — Z9861 Coronary angioplasty status: Secondary | ICD-10-CM | POA: Diagnosis not present

## 2024-06-12 DIAGNOSIS — I2510 Atherosclerotic heart disease of native coronary artery without angina pectoris: Secondary | ICD-10-CM | POA: Diagnosis not present

## 2024-06-12 NOTE — Progress Notes (Signed)
 Cardiac Individual Treatment Plan  Patient Details  Name: Alex Brandt MRN: [id number] Date of Birth: 25-May-1976 Referring Provider:   Flowsheet Row Cardiac Rehab from 06/12/2024 in [facility] Cardiac and Pulmonary Rehab  Referring Provider Dr. Zachary Car, MD    Initial Encounter Date:  Flowsheet Row Cardiac Rehab from 06/12/2024 in [facility] Cardiac and Pulmonary Rehab  Date 06/12/24    Visit Diagnosis: S/P PTCA (percutaneous transluminal coronary angioplasty)  Patient's Home Medications on Admission:  Current Outpatient Medications:    acetaminophen  (TYLENOL ) 325 MG tablet, Take 2 tablets (650 mg total) by mouth every 6 (six) hours as needed for mild pain, fever or headache (or Fever >/= 101)., Disp: 20 tablet, Rfl: 1   aspirin  EC 81 MG tablet, Take 81 mg by mouth daily., Disp: , Rfl:    clopidogrel  (PLAVIX ) 75 MG tablet, Take 75 mg by mouth daily., Disp: , Rfl:    ezetimibe (ZETIA) 10 MG tablet, Take 10 mg by mouth daily., Disp: , Rfl:    metoprolol  succinate (TOPROL -XL) 50 MG 24 hr tablet, Take 50 mg by mouth daily., Disp: , Rfl:    nitroGLYCERIN  (NITROSTAT ) 0.4 MG SL tablet, Place 0.4 mg under the tongue every 5 (five) minutes as needed for chest pain., Disp: , Rfl:    omeprazole (PRILOSEC) 40 MG capsule, Take 40 mg by mouth daily., Disp: , Rfl:    ranolazine  (RANEXA ) 500 MG 12 hr tablet, Take 1 tablet (500 mg total) by mouth 2 (two) times daily., Disp: 60 tablet, Rfl: 0   REPATHA SURECLICK 140 MG/ML SOAJ, Inject 140 mg into the skin every 14 (fourteen) days., Disp: , Rfl:    rosuvastatin  (CRESTOR ) 40 MG tablet, Take 40 mg by mouth at bedtime., Disp: , Rfl:   Past Medical History: Past Medical History:  Diagnosis Date   Angina at rest [facility])    Arthritis    Coronary artery disease 05/05/2021   a.) LHC 05/05/2021: EF 55-65%; normal LV function and LVEDP; 50% mLAD, 50% D1, 50% p-mRCA; med mgmt.   DJD (degenerative joint disease) of knee    Dyspnea    GERD (gastroesophageal reflux  disease)    HLD (hyperlipidemia)    Hyperkalemia    Hypertension    Migraines    Myocardial infarction [facility])    Valvular insufficiency 12/18/2014   a.) TTE 12/18/2014: EF >55%; mild PR, moderate MR/TR.    Tobacco Use: Social History   Tobacco Use  Smoking Status Every Day   Current packs/day: 0.50   Average packs/day: 0.5 packs/day for 20.0 years (10.0 ttl pk-yrs)   Types: Cigarettes  Smokeless Tobacco Former    Labs: Investment banker, operational       Latest Ref Rng & Units 06/02/2022 03/12/2023  Labs for ITP Cardiac and Pulmonary Rehab  Cholestrol 0 - 200 mg/dL 821  877   LDL (calc) 0 - 99 mg/dL 875  75   HDL-C >59 mg/dL 45  35   Trlycerides <849 mg/dL 47  62   Hemoglobin J8r 4.8 - 5.6 % 5.3  5.7      Exercise Target Goals: Exercise Program Goal: Individual exercise prescription set using results from initial 6 min walk test and THRR while considering  patient's activity barriers and safety.   Exercise Prescription Goal: Initial exercise prescription builds to 30-45 minutes a day of aerobic activity, 2-3 days per week.  Home exercise guidelines will be given to patient during program as part of exercise prescription that the participant will acknowledge.  Education: Aerobic Exercise: - Group verbal and visual presentation on the components of exercise prescription. Introduces F.I.T.T principle from ACSM for exercise prescriptions.  Reviews F.I.T.T. principles of aerobic exercise including progression. Written material provided at class time. Flowsheet Row Cardiac Rehab from 06/12/2024 in [facility] Cardiac and Pulmonary Rehab  Education need identified 06/12/24    Education: Resistance Exercise: - Group verbal and visual presentation on the components of exercise prescription. Introduces F.I.T.T principle from ACSM for exercise prescriptions  Reviews F.I.T.T. principles of resistance exercise including progression. Written material provided at class time. Flowsheet Row Cardiac Rehab  from 07/13/2022 in [facility] Cardiac and Pulmonary Rehab  Education need identified 07/06/22     Education: Exercise & Equipment Safety: - Individual verbal instruction and demonstration of equipment use and safety with use of the equipment. Flowsheet Row Cardiac Rehab from 06/12/2024 in [facility] Cardiac and Pulmonary Rehab  Date 06/12/24  Educator NT  Instruction Review Code 1- Verbalizes Understanding    Education: Exercise Physiology & General Exercise Guidelines: - Group verbal and written instruction with models to review the exercise physiology of the cardiovascular system and associated critical values. Provides general exercise guidelines with specific guidelines to those with heart or lung disease. Written material provided at class time.   Education: Flexibility, Balance, Mind/Body Relaxation: - Group verbal and visual presentation with interactive activity on the components of exercise prescription. Introduces F.I.T.T principle from ACSM for exercise prescriptions. Reviews F.I.T.T. principles of flexibility and balance exercise training including progression. Also discusses the mind body connection.  Reviews various relaxation techniques to help reduce and manage stress (i.e. Deep breathing, progressive muscle relaxation, and visualization). Balance handout provided to take home. Written material provided at class time.   Activity Barriers & Risk Stratification:  Activity Barriers & Cardiac Risk Stratification - 06/11/24 1305       Activity Barriers & Cardiac Risk Stratification   Activity Barriers Right Knee Replacement;Joint Problems    Cardiac Risk Stratification High          6 Minute Walk:  6 Minute Walk     Row Name 06/12/24 0912         6 Minute Walk   Phase Initial     Distance 1580 feet     Walk Time 6 minutes     # of Rest Breaks 0     MPH 2.99     METS 4.85     RPE 13     Perceived Dyspnea  1     VO2 Peak 16.97     Symptoms Yes (comment)     Comments Right  knee pain 4/10     Resting HR 71 bpm     Resting BP 112/68     Resting Oxygen Saturation  99 %     Exercise Oxygen Saturation  during 6 min walk 99 %     Max Ex. HR 112 bpm     Max Ex. BP 126/68     2 Minute Post BP 108/66        Oxygen Initial Assessment:   Oxygen Re-Evaluation:   Oxygen Discharge (Final Oxygen Re-Evaluation):   Initial Exercise Prescription:  Initial Exercise Prescription - 06/12/24 0900       Date of Initial Exercise RX and Referring Provider   Date 06/12/24    Referring Provider Dr. Zachary Car, MD      Oxygen   Maintain Oxygen Saturation 88% or higher      Treadmill   MPH 3  Grade 1    Minutes 15    METs 3.71      NuStep   Level 4   T6   SPM 80    Minutes 15    METs 4.85      REL-XR   Level 4    Speed 50    Minutes 15    METs 4.85      Rower   Level 8    Watts 50    Minutes 15    METs 4.85      Prescription Details   Frequency (times per week) 3    Duration Progress to 30 minutes of continuous aerobic without signs/symptoms of physical distress      Intensity   THRR 40-80% of Max Heartrate 112-153    Ratings of Perceived Exertion 11-13    Perceived Dyspnea 0-4      Progression   Progression Continue to progress workloads to maintain intensity without signs/symptoms of physical distress.      Resistance Training   Training Prescription Yes    Weight 8 lb    Reps 10-15          Perform Capillary Blood Glucose checks as needed.  Exercise Prescription Changes:   Exercise Prescription Changes     Row Name 06/12/24 0900             Response to Exercise   Blood Pressure (Admit) 112/68       Blood Pressure (Exercise) 126/68       Blood Pressure (Exit) 108/66       Heart Rate (Admit) 71 bpm       Heart Rate (Exercise) 112 bpm       Heart Rate (Exit) 79 bpm       Oxygen Saturation (Admit) 99 %       Oxygen Saturation (Exercise) 99 %       Rating of Perceived Exertion (Exercise) 13       Perceived  Dyspnea (Exercise) 1       Symptoms R knee pain 4/10       Comments Results          Exercise Comments:   Exercise Goals and Review:   Exercise Goals     Row Name 06/12/24 0913             Exercise Goals   Increase Physical Activity Yes       Intervention Provide advice, education, support and counseling about physical activity/exercise needs.;Develop an individualized exercise prescription for aerobic and resistive training based on initial evaluation findings, risk stratification, comorbidities and participant's personal goals.       Expected Outcomes Short Term: Attend rehab on a regular basis to increase amount of physical activity.;Long Term: Add in home exercise to make exercise part of routine and to increase amount of physical activity.;Long Term: Exercising regularly at least 3-5 days a week.       Increase Strength and Stamina Yes       Intervention Provide advice, education, support and counseling about physical activity/exercise needs.;Develop an individualized exercise prescription for aerobic and resistive training based on initial evaluation findings, risk stratification, comorbidities and participant's personal goals.       Expected Outcomes Short Term: Increase workloads from initial exercise prescription for resistance, speed, and METs.;Short Term: Perform resistance training exercises routinely during rehab and add in resistance training at home;Long Term: Improve cardiorespiratory fitness, muscular endurance and strength as measured by increased METs and functional capacity (  )       Able to understand and use rate of perceived exertion (RPE) scale Yes       Intervention Provide education and explanation on how to use RPE scale       Expected Outcomes Short Term: Able to use RPE daily in rehab to express subjective intensity level;Long Term:  Able to use RPE to guide intensity level when exercising independently       Able to understand and use Dyspnea scale  Yes       Intervention Provide education and explanation on how to use Dyspnea scale       Expected Outcomes Short Term: Able to use Dyspnea scale daily in rehab to express subjective sense of shortness of breath during exertion;Long Term: Able to use Dyspnea scale to guide intensity level when exercising independently       Knowledge and understanding of Target Heart Rate Range (THRR) Yes       Intervention Provide education and explanation of THRR including how the numbers were predicted and where they are located for reference       Expected Outcomes Short Term: Able to state/look up THRR;Long Term: Able to use THRR to govern intensity when exercising independently;Short Term: Able to use daily as guideline for intensity in rehab       Able to check pulse independently Yes       Intervention Provide education and demonstration on how to check pulse in carotid and radial arteries.;Review the importance of being able to check your own pulse for safety during independent exercise       Expected Outcomes Short Term: Able to explain why pulse checking is important during independent exercise;Long Term: Able to check pulse independently and accurately       Understanding of Exercise Prescription Yes       Intervention Provide education, explanation, and written materials on patient's individual exercise prescription       Expected Outcomes Short Term: Able to explain program exercise prescription;Long Term: Able to explain home exercise prescription to exercise independently          Exercise Goals Re-Evaluation :   Discharge Exercise Prescription (Final Exercise Prescription Changes):  Exercise Prescription Changes - 06/12/24 0900       Response to Exercise   Blood Pressure (Admit) 112/68    Blood Pressure (Exercise) 126/68    Blood Pressure (Exit) 108/66    Heart Rate (Admit) 71 bpm    Heart Rate (Exercise) 112 bpm    Heart Rate (Exit) 79 bpm    Oxygen Saturation (Admit) 99 %    Oxygen  Saturation (Exercise) 99 %    Rating of Perceived Exertion (Exercise) 13    Perceived Dyspnea (Exercise) 1    Symptoms R knee pain 4/10    Comments Results          Nutrition:  Target Goals: Understanding of nutrition guidelines, daily intake of sodium 1500mg , cholesterol 200mg , calories 30% from fat and 7% or less from saturated fats, daily to have 5 or more servings of fruits and vegetables.  Education: Nutrition 1 -Group instruction provided by verbal, written material, interactive activities, discussions, models, and posters to present general guidelines for heart healthy nutrition including macronutrients, label reading, and promoting whole foods over processed counterparts. Education serves as Pensions consultant of discussion of heart healthy eating for all. Written material provided at class time.    Education: Nutrition 2 -Group instruction provided by verbal, written material, interactive activities, discussions,  models, and posters to present general guidelines for heart healthy nutrition including sodium, cholesterol, and saturated fat. Providing guidance of habit forming to improve blood pressure, cholesterol, and body weight. Written material provided at class time.     Biometrics:  Pre Biometrics - 06/12/24 0914       Pre Biometrics   Height 6' 3.8 (1.925 m)    Weight 230 lb 3.2 oz (104.4 kg)    Waist Circumference 42 inches    Hip Circumference 41 inches    Waist to Hip Ratio 1.02 %    BMI (Calculated) 28.18    Single Leg Stand 30 seconds           Nutrition Therapy Plan and Nutrition Goals:  Nutrition Therapy & Goals - 06/12/24 0901       Intervention Plan   Intervention Prescribe, educate and counsel regarding individualized specific dietary modifications aiming towards targeted core components such as weight, hypertension, lipid management, diabetes, heart failure and other comorbidities.    Expected Outcomes Short Term Goal: Understand basic  principles of dietary content, such as calories, fat, sodium, cholesterol and nutrients.;Short Term Goal: A plan has been developed with personal nutrition goals set during dietitian appointment.;Long Term Goal: Adherence to prescribed nutrition plan.          Nutrition Assessments:  MEDIFICTS Score Key: >=70 Need to make dietary changes  40-70 Heart Healthy Diet <= 40 Therapeutic Level Cholesterol Diet  Flowsheet Row Cardiac Rehab from 06/12/2024 in [facility] Cardiac and Pulmonary Rehab  Picture Your Plate Total Score on Admission 49   Picture Your Plate Scores: <59 Unhealthy dietary pattern with much room for improvement. 41-50 Dietary pattern unlikely to meet recommendations for good health and room for improvement. 51-60 More healthful dietary pattern, with some room for improvement.  >60 Healthy dietary pattern, although there may be some specific behaviors that could be improved.    Nutrition Goals Re-Evaluation:   Nutrition Goals Discharge (Final Nutrition Goals Re-Evaluation):   Psychosocial: Target Goals: Acknowledge presence or absence of significant depression and/or stress, maximize coping skills, provide positive support system. Participant is able to verbalize types and ability to use techniques and skills needed for reducing stress and depression.   Education: Stress, Anxiety, and Depression - Group verbal and visual presentation to define topics covered.  Reviews how body is impacted by stress, anxiety, and depression.  Also discusses healthy ways to reduce stress and to treat/manage anxiety and depression. Written material provided at class time. Flowsheet Row Cardiac Rehab from 06/12/2024 in [facility] Cardiac and Pulmonary Rehab  Education need identified 06/12/24    Education: Sleep Hygiene -Provides group verbal and written instruction about how sleep can affect your health.  Define sleep hygiene, discuss sleep cycles and impact of sleep habits. Review good sleep hygiene  tips.   Initial Review & Psychosocial Screening:  Initial Psych Review & Screening - 06/11/24 1311       Initial Review   Current issues with Current Stress Concerns    Source of Stress Concerns Financial      Family Dynamics   Good Support System? Yes      Barriers   Psychosocial barriers to participate in program There are no identifiable barriers or psychosocial needs.      Screening Interventions   Interventions Encouraged to exercise;To provide support and resources with identified psychosocial needs;Provide feedback about the scores to participant    Expected Outcomes Short Term goal: Utilizing psychosocial counselor, staff and physician to assist with  identification of specific Stressors or current issues interfering with healing process. Setting desired goal for each stressor or current issue identified.;Long Term Goal: Stressors or current issues are controlled or eliminated.;Short Term goal: Identification and review with participant of any Quality of Life or Depression concerns found by scoring the questionnaire.;Long Term goal: The participant improves quality of Life and PHQ9 Scores as seen by post scores and/or verbalization of changes          Quality of Life Scores:   Quality of Life - 06/12/24 0900       Quality of Life   Select Quality of Life      Quality of Life Scores   Health/Function Pre 29.6 %    Socioeconomic Pre 30 %    Psych/Spiritual Pre 30 %    Family Pre 30 %    GLOBAL Pre 29.83 %         Scores of 19 and below usually indicate a poorer quality of life in these areas.  A difference of  2-3 points is a clinically meaningful difference.  A difference of 2-3 points in the total score of the Quality of Life Index has been associated with significant improvement in overall quality of life, self-image, physical symptoms, and general health in studies assessing change in quality of life.  PHQ-9: Review Flowsheet       06/12/2024 07/06/2022   Depression screen PHQ 2/9  Decreased Interest 0 1  Down, Depressed, Hopeless 0 0  PHQ - 2 Score 0 1  Altered sleeping 1 2  Tired, decreased energy 1 2  Change in appetite 1 2  Feeling bad or failure about yourself  0 0  Trouble concentrating 0 0  Moving slowly or fidgety/restless 0 0  Suicidal thoughts 0 0  PHQ-9 Score 3 7  Difficult doing work/chores Not difficult at all Somewhat difficult   Interpretation of Total Score  Total Score Depression Severity:  1-4 = Minimal depression, 5-9 = Mild depression, 10-14 = Moderate depression, 15-19 = Moderately severe depression, 20-27 = Severe depression   Psychosocial Evaluation and Intervention:  Psychosocial Evaluation - 06/11/24 1322       Psychosocial Evaluation & Interventions   Interventions Encouraged to exercise with the program and follow exercise prescription;Stress management education    Comments Mr. Schmale is coming to cardiac rehab after a stent placement. He is working on quitting smoking and losing weight. He recently got a new job, but has been out of work for a month so the financial stress has been a struggle recently. He started back to work today and feels this will help once his paycheck comes in. His job is supportive of him attending the program and has already worked his schedule around coming three times a week. 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 He mentions that he does feel better eating throughout the day instead of waiting til dinner time. He is motivated to attend the program    Expected Outcomes Short: attend cardiac rehab for education and exercise Long: develop and maintain positive self care habits    Continue Psychosocial Services  Follow up required by staff          Psychosocial Re-Evaluation:   Psychosocial Discharge (Final Psychosocial Re-Evaluation):   Vocational  Rehabilitation: Provide vocational rehab assistance to qualifying candidates.   Vocational Rehab Evaluation & Intervention:  Vocational Rehab -  06/11/24 1309       Initial Vocational Rehab Evaluation & Intervention   Assessment shows need for Vocational Rehabilitation No          Education: Education Goals: Education classes will be provided on a variety of topics geared toward better understanding of heart health and risk factor modification. Participant will state understanding/return demonstration of topics presented as noted by education test scores.  Learning Barriers/Preferences:  Learning Barriers/Preferences - 06/11/24 1309       Learning Barriers/Preferences   Learning Barriers None    Learning Preferences None          General Cardiac Education Topics:  AED/CPR: - Group verbal and written instruction with the use of models to demonstrate the basic use of the AED with the basic ABC's of resuscitation.   Test and Procedures: - Group verbal and visual presentation and models provide information about basic cardiac anatomy and function. Reviews the testing methods done to diagnose heart disease and the outcomes of the test results. Describes the treatment choices: Medical Management, Angioplasty, or Coronary Bypass Surgery for treating various heart conditions including Myocardial Infarction, Angina, Valve Disease, and Cardiac Arrhythmias. Written material provided at class time. Flowsheet Row Cardiac Rehab from 06/12/2024 in [facility] Cardiac and Pulmonary Rehab  Education need identified 06/12/24    Medication Safety: - Group verbal and visual instruction to review commonly prescribed medications for heart and lung disease. Reviews the medication, class of the drug, and side effects. Includes the steps to properly store meds and maintain the prescription regimen. Written material provided at class time. Flowsheet Row Cardiac Rehab from 07/13/2022 in [facility] Cardiac and  Pulmonary Rehab  Date 07/13/22  Educator SB  Instruction Review Code 1- Verbalizes Understanding    Intimacy: - Group verbal instruction through game format to discuss how heart and lung disease can affect sexual intimacy. Written material provided at class time.   Know Your Numbers and Heart Failure: - Group verbal and visual instruction to discuss disease risk factors for cardiac and pulmonary disease and treatment options.  Reviews associated critical values for Overweight/Obesity, Hypertension, Cholesterol, and Diabetes.  Discusses basics of heart failure: signs/symptoms and treatments.  Introduces Heart Failure Zone chart for action plan for heart failure. Written material provided at class time. Flowsheet Row Cardiac Rehab from 07/13/2022 in [facility] Cardiac and Pulmonary Rehab  Education need identified 07/06/22    Infection Prevention: - Provides verbal and written material to individual with discussion of infection control including proper hand washing and proper equipment cleaning during exercise session. Flowsheet Row Cardiac Rehab from 06/12/2024 in [facility] Cardiac and Pulmonary Rehab  Date 06/12/24  Educator NT  Instruction Review Code 1- Verbalizes Understanding    Falls Prevention: - Provides verbal and written material to individual with discussion of falls prevention and safety. Flowsheet Row Cardiac Rehab from 06/12/2024 in [facility] Cardiac and Pulmonary Rehab  Date 06/12/24  Educator NT  Instruction Review Code 1- Verbalizes Understanding    Other: -Provides group and verbal instruction on various topics (see comments)   Knowledge Questionnaire Score:  Knowledge Questionnaire Score - 06/12/24 0859       Knowledge Questionnaire Score   Pre Score 20/26          Core Components/Risk Factors/Patient Goals at Admission:  Personal Goals and Risk Factors at Admission - 06/11/24 1306       Core Components/Risk Factors/Patient Goals on Admission    Weight Management  Yes;Weight Loss    Intervention Weight Management: Develop  a combined nutrition and exercise program designed to reach desired caloric intake, while maintaining appropriate intake of nutrient and fiber, sodium and fats, and appropriate energy expenditure required for the weight goal.;Weight Management: Provide education and appropriate resources to help participant work on and attain dietary goals.;Weight Management/Obesity: Establish reasonable short term and long term weight goals.    Goal Weight: Long Term 185 lb (83.9 kg)    Expected Outcomes Short Term: Continue to assess and modify interventions until short term weight is achieved;Long Term: Adherence to nutrition and physical activity/exercise program aimed toward attainment of established weight goal;Weight Loss: Understanding of general recommendations for a balanced deficit meal plan, which promotes 1-2 lb weight loss per week and includes a negative energy balance of [phone] kcal/d;Understanding of distribution of calorie intake throughout the day with the consumption of 4-5 meals/snacks;Understanding recommendations for meals to include 15-35% energy as protein, 25-35% energy from fat, 35-60% energy from carbohydrates, less than 200mg  of dietary cholesterol, 20-35 gm of total fiber daily    Tobacco Cessation Yes    Number of packs per day 0.5    Intervention Assist the participant in steps to quit. Provide individualized education and counseling about committing to Tobacco Cessation, relapse prevention, and pharmacological support that can be provided by physician.;Education officer, environmental, assist with locating and accessing local/national Quit Smoking programs, and support quit date choice.    Expected Outcomes Short Term: Will demonstrate readiness to quit, by selecting a quit date.;Short Term: Will quit all tobacco product use, adhering to prevention of relapse plan.;Long Term: Complete abstinence from all tobacco products for at least 12  months from quit date.    Hypertension Yes    Intervention Provide education on lifestyle modifcations including regular physical activity/exercise, weight management, moderate sodium restriction and increased consumption of fresh fruit, vegetables, and low fat dairy, alcohol moderation, and smoking cessation.;Monitor prescription use compliance.    Expected Outcomes Short Term: Continued assessment and intervention until BP is < 140/18mm HG in hypertensive participants. < 130/24mm HG in hypertensive participants with diabetes, heart failure or chronic kidney disease.;Long Term: Maintenance of blood pressure at goal levels.    Lipids Yes    Intervention Provide education and support for participant on nutrition & aerobic/resistive exercise along with prescribed medications to achieve LDL 70mg , HDL >40mg .    Expected Outcomes Short Term: Participant states understanding of desired cholesterol values and is compliant with medications prescribed. Participant is following exercise prescription and nutrition guidelines.;Long Term: Cholesterol controlled with medications as prescribed, with individualized exercise RX and with personalized nutrition plan. Value goals: LDL < 70mg , HDL > 40 mg.          Education:Diabetes - Individual verbal and written instruction to review signs/symptoms of diabetes, desired ranges of glucose level fasting, after meals and with exercise. Acknowledge that pre and post exercise glucose checks will be done for 3 sessions at entry of program.   Core Components/Risk Factors/Patient Goals Review:    Core Components/Risk Factors/Patient Goals at Discharge (Final Review):    ITP Comments:  ITP Comments     Row Name 06/11/24 1333 06/12/24 0855         ITP Comments Initial phone call completed. Diagnosis can be found in [facility] 05/18/24. EP Orientation scheduled for Wednesday 9/3 at 8am Completed and gym orientation for cardiac rehab. Initial ITP created and sent for  review to Dr. Oneil Pinal, Medical Director.         Comments: Initial ITP

## 2024-06-12 NOTE — Patient Instructions (Signed)
 Patient Instructions  Patient Details  Name: Alex Brandt MRN: [id number] Date of Birth: December 04, 1975 Referring Provider:  Patricia Zachary LABOR, MD  Below are your personal goals for exercise, nutrition, and risk factors. Our goal is to help you stay on track towards obtaining and maintaining these goals. We will be discussing your progress on these goals with you throughout the program.  Initial Exercise Prescription:  Initial Exercise Prescription - 06/12/24 0900       Date of Initial Exercise RX and Referring Provider   Date 06/12/24    Referring Provider Dr. Zachary Patricia, MD      Oxygen   Maintain Oxygen Saturation 88% or higher      Treadmill   MPH 3    Grade 1    Minutes 15    METs 3.71      NuStep   Level 4   T6   SPM 80    Minutes 15    METs 4.85      REL-XR   Level 4    Speed 50    Minutes 15    METs 4.85      Rower   Level 8    Watts 50    Minutes 15    METs 4.85      Prescription Details   Frequency (times per week) 3    Duration Progress to 30 minutes of continuous aerobic without signs/symptoms of physical distress      Intensity   THRR 40-80% of Max Heartrate 112-153    Ratings of Perceived Exertion 11-13    Perceived Dyspnea 0-4      Progression   Progression Continue to progress workloads to maintain intensity without signs/symptoms of physical distress.      Resistance Training   Training Prescription Yes    Weight 8 lb    Reps 10-15          Exercise Goals: Frequency: Be able to perform aerobic exercise two to three times per week in program working toward 2-5 days per week of home exercise.  Intensity: Work with a perceived exertion of 11 (fairly light) - 15 (hard) while following your exercise prescription.  We will make changes to your prescription with you as you progress through the program.   Duration: Be able to do 30 to 45 minutes of continuous aerobic exercise in addition to a 5 minute warm-up and a 5 minute  cool-down routine.   Nutrition Goals: Your personal nutrition goals will be established when you do your nutrition analysis with the dietician.  The following are general nutrition guidelines to follow: Cholesterol < 200mg /day Sodium < 1500mg /day Fiber: Men under 50 yrs - 38 grams per day  Personal Goals:  Personal Goals and Risk Factors at Admission - 06/11/24 1306       Core Components/Risk Factors/Patient Goals on Admission    Weight Management Yes;Weight Loss    Intervention Weight Management: Develop a combined nutrition and exercise program designed to reach desired caloric intake, while maintaining appropriate intake of nutrient and fiber, sodium and fats, and appropriate energy expenditure required for the weight goal.;Weight Management: Provide education and appropriate resources to help participant work on and attain dietary goals.;Weight Management/Obesity: Establish reasonable short term and long term weight goals.    Goal Weight: Long Term 185 lb (83.9 kg)    Expected Outcomes Short Term: Continue to assess and modify interventions until short term weight is achieved;Long Term: Adherence to nutrition and physical activity/exercise program aimed  toward attainment of established weight goal;Weight Loss: Understanding of general recommendations for a balanced deficit meal plan, which promotes 1-2 lb weight loss per week and includes a negative energy balance of [phone] kcal/d;Understanding of distribution of calorie intake throughout the day with the consumption of 4-5 meals/snacks;Understanding recommendations for meals to include 15-35% energy as protein, 25-35% energy from fat, 35-60% energy from carbohydrates, less than 200mg  of dietary cholesterol, 20-35 gm of total fiber daily    Tobacco Cessation Yes    Number of packs per day 0.5    Intervention Assist the participant in steps to quit. Provide individualized education and counseling about committing to Tobacco Cessation,  relapse prevention, and pharmacological support that can be provided by physician.;Education officer, environmental, assist with locating and accessing local/national Quit Smoking programs, and support quit date choice.    Expected Outcomes Short Term: Will demonstrate readiness to quit, by selecting a quit date.;Short Term: Will quit all tobacco product use, adhering to prevention of relapse plan.;Long Term: Complete abstinence from all tobacco products for at least 12 months from quit date.    Hypertension Yes    Intervention Provide education on lifestyle modifcations including regular physical activity/exercise, weight management, moderate sodium restriction and increased consumption of fresh fruit, vegetables, and low fat dairy, alcohol moderation, and smoking cessation.;Monitor prescription use compliance.    Expected Outcomes Short Term: Continued assessment and intervention until BP is < 140/62mm HG in hypertensive participants. < 130/24mm HG in hypertensive participants with diabetes, heart failure or chronic kidney disease.;Long Term: Maintenance of blood pressure at goal levels.    Lipids Yes    Intervention Provide education and support for participant on nutrition & aerobic/resistive exercise along with prescribed medications to achieve LDL 70mg , HDL >40mg .    Expected Outcomes Short Term: Participant states understanding of desired cholesterol values and is compliant with medications prescribed. Participant is following exercise prescription and nutrition guidelines.;Long Term: Cholesterol controlled with medications as prescribed, with individualized exercise RX and with personalized nutrition plan. Value goals: LDL < 70mg , HDL > 40 mg.         Exercise Goals and Review:  Exercise Goals     Row Name 06/12/24 0913             Exercise Goals   Increase Physical Activity Yes       Intervention Provide advice, education, support and counseling about physical activity/exercise  needs.;Develop an individualized exercise prescription for aerobic and resistive training based on initial evaluation findings, risk stratification, comorbidities and participant's personal goals.       Expected Outcomes Short Term: Attend rehab on a regular basis to increase amount of physical activity.;Long Term: Add in home exercise to make exercise part of routine and to increase amount of physical activity.;Long Term: Exercising regularly at least 3-5 days a week.       Increase Strength and Stamina Yes       Intervention Provide advice, education, support and counseling about physical activity/exercise needs.;Develop an individualized exercise prescription for aerobic and resistive training based on initial evaluation findings, risk stratification, comorbidities and participant's personal goals.       Expected Outcomes Short Term: Increase workloads from initial exercise prescription for resistance, speed, and METs.;Short Term: Perform resistance training exercises routinely during rehab and add in resistance training at home;Long Term: Improve cardiorespiratory fitness, muscular endurance and strength as measured by increased METs and functional capacity ( )       Able to understand and use rate of perceived  exertion (RPE) scale Yes       Intervention Provide education and explanation on how to use RPE scale       Expected Outcomes Short Term: Able to use RPE daily in rehab to express subjective intensity level;Long Term:  Able to use RPE to guide intensity level when exercising independently       Able to understand and use Dyspnea scale Yes       Intervention Provide education and explanation on how to use Dyspnea scale       Expected Outcomes Short Term: Able to use Dyspnea scale daily in rehab to express subjective sense of shortness of breath during exertion;Long Term: Able to use Dyspnea scale to guide intensity level when exercising independently       Knowledge and understanding of  Target Heart Rate Range (THRR) Yes       Intervention Provide education and explanation of THRR including how the numbers were predicted and where they are located for reference       Expected Outcomes Short Term: Able to state/look up THRR;Long Term: Able to use THRR to govern intensity when exercising independently;Short Term: Able to use daily as guideline for intensity in rehab       Able to check pulse independently Yes       Intervention Provide education and demonstration on how to check pulse in carotid and radial arteries.;Review the importance of being able to check your own pulse for safety during independent exercise       Expected Outcomes Short Term: Able to explain why pulse checking is important during independent exercise;Long Term: Able to check pulse independently and accurately       Understanding of Exercise Prescription Yes       Intervention Provide education, explanation, and written materials on patient's individual exercise prescription       Expected Outcomes Short Term: Able to explain program exercise prescription;Long Term: Able to explain home exercise prescription to exercise independently

## 2024-06-14 ENCOUNTER — Encounter

## 2024-06-17 ENCOUNTER — Encounter

## 2024-06-17 DIAGNOSIS — Z48812 Encounter for surgical aftercare following surgery on the circulatory system: Secondary | ICD-10-CM | POA: Diagnosis not present

## 2024-06-17 DIAGNOSIS — Z9861 Coronary angioplasty status: Secondary | ICD-10-CM

## 2024-06-17 NOTE — Progress Notes (Addendum)
 Daily Session Note  Patient Details  Name: Alex Brandt MRN: [id number] Date of Birth: 1976-02-22 Referring Provider:   Flowsheet Row Cardiac Rehab from 06/12/2024 in [facility] Cardiac and Pulmonary Rehab  Referring Provider Dr. Zachary Car, MD    Encounter Date: 06/17/2024  Check In:  Session Check In - 06/17/24 0804       Check-In   Supervising physician immediately available to respond to emergencies See telemetry face sheet for immediately available ER MD    Location [facility]-Cardiac & Pulmonary Rehab    Staff Present Burnard Davenport RN,BSN,MPA;Joseph [facility] Dyane BS, ACSM CEP, Exercise Physiologist;Jason Elnor RDN,LDN    Virtual Visit No    Medication changes reported     No    Fall or balance concerns reported    No    Tobacco Cessation Use Decreased    Current number of cigarettes/nicotine  per day     3    Warm-up and Cool-down Performed on first and last piece of equipment    Resistance Training Performed Yes    VAD Patient? No    PAD/SET Patient? No      Pain Assessment   Currently in Pain? No/denies             Social History   Tobacco Use  Smoking Status Every Day   Current packs/day: 0.50   Average packs/day: 0.5 packs/day for 20.0 years (10.0 ttl pk-yrs)   Types: Cigarettes  Smokeless Tobacco Former    Goals Met:  Independence with exercise equipment Exercise tolerated well No report of concerns or symptoms today Strength training completed today  Goals Unmet:  Not Applicable  Comments: First full day of exercise!  Patient was oriented to gym and equipment including functions, settings, policies, and procedures.  Patient's individual exercise prescription and treatment plan were reviewed.  All starting workloads were established based on the results of the 6 minute walk test done at initial orientation visit.  The plan for exercise progression was also introduced and progression will be customized based on patient's performance and  goals.     Dr. Oneil Pinal is Medical Director for [facility] Cardiac Rehabilitation.  Dr. Fuad Aleskerov is Medical Director for [facility] Pulmonary Rehabilitation.

## 2024-06-19 ENCOUNTER — Encounter

## 2024-06-19 DIAGNOSIS — Z9861 Coronary angioplasty status: Secondary | ICD-10-CM

## 2024-06-19 DIAGNOSIS — Z48812 Encounter for surgical aftercare following surgery on the circulatory system: Secondary | ICD-10-CM | POA: Diagnosis not present

## 2024-06-19 NOTE — Progress Notes (Signed)
 Daily Session Note  Patient Details  Name: Alex Brandt MRN: [id number] Date of Birth: 1975/12/27 Referring Provider:   Flowsheet Row Cardiac Rehab from 06/12/2024 in [facility] Cardiac and Pulmonary Rehab  Referring Provider Dr. Zachary Car, MD    Encounter Date: 06/19/2024  Check In:  Session Check In - 06/19/24 0752       Check-In   Supervising physician immediately available to respond to emergencies See telemetry face sheet for immediately available ER MD    Location [facility]-Cardiac & Pulmonary Rehab    Staff Present Burnard Davenport RN,BSN,MPA;Maxon Conetta BS, Exercise Physiologist;Joseph Rolinda RCP,RRT,BSRT;Jason Elnor RDN,LDN    Virtual Visit No    Medication changes reported     No    Fall or balance concerns reported    No    Tobacco Cessation No Change    Warm-up and Cool-down Performed on first and last piece of equipment    Resistance Training Performed Yes    VAD Patient? No    PAD/SET Patient? No      Pain Assessment   Currently in Pain? No/denies             Social History   Tobacco Use  Smoking Status Every Day   Current packs/day: 0.50   Average packs/day: 0.5 packs/day for 20.0 years (10.0 ttl pk-yrs)   Types: Cigarettes  Smokeless Tobacco Former    Goals Met:  Independence with exercise equipment Exercise tolerated well No report of concerns or symptoms today Strength training completed today  Goals Unmet:  Not Applicable  Comments: Pt able to follow exercise prescription today without complaint.  Will continue to monitor for progression.    Dr. Oneil Pinal is Medical Director for [facility] Cardiac Rehabilitation.  Dr. Fuad Aleskerov is Medical Director for [facility] Pulmonary Rehabilitation.

## 2024-06-21 ENCOUNTER — Encounter

## 2024-06-21 DIAGNOSIS — Z9861 Coronary angioplasty status: Secondary | ICD-10-CM

## 2024-06-21 DIAGNOSIS — Z48812 Encounter for surgical aftercare following surgery on the circulatory system: Secondary | ICD-10-CM | POA: Diagnosis not present

## 2024-06-21 NOTE — Progress Notes (Signed)
 Daily Session Note  Patient Details  Name: Alex Brandt MRN: [id number] Date of Birth: August 18, 1976 Referring Provider:   Flowsheet Row Cardiac Rehab from 06/12/2024 in [facility] Cardiac and Pulmonary Rehab  Referring Provider Dr. Zachary Car, MD    Encounter Date: 06/21/2024  Check In:  Session Check In - 06/21/24 0720       Check-In   Supervising physician immediately available to respond to emergencies See telemetry face sheet for immediately available ER MD    Location [facility]-Cardiac & Pulmonary Rehab    Staff Present Burnard Davenport RN,BSN,MPA;Joseph [facility] RCP,RRT,BSRT;Maxon Conetta BS, Exercise Physiologist;Noah Tickle, BS, Exercise Physiologist    Virtual Visit No    Medication changes reported     No    Fall or balance concerns reported    No    Tobacco Cessation Use Increase    Current number of cigarettes/nicotine  per day     6    Warm-up and Cool-down Performed on first and last piece of equipment    Resistance Training Performed Yes    VAD Patient? No    PAD/SET Patient? No      Pain Assessment   Currently in Pain? No/denies             Social History   Tobacco Use  Smoking Status Every Day   Current packs/day: 0.50   Average packs/day: 0.5 packs/day for 20.0 years (10.0 ttl pk-yrs)   Types: Cigarettes  Smokeless Tobacco Former    Goals Met:  Independence with exercise equipment Exercise tolerated well No report of concerns or symptoms today Strength training completed today  Goals Unmet:  Not Applicable  Comments: Pt able to follow exercise prescription today without complaint.  Will continue to monitor for progression.    Dr. Oneil Pinal is Medical Director for [facility] Cardiac Rehabilitation.  Dr. Fuad Aleskerov is Medical Director for [facility] Pulmonary Rehabilitation.

## 2024-06-24 ENCOUNTER — Encounter

## 2024-06-24 DIAGNOSIS — Z48812 Encounter for surgical aftercare following surgery on the circulatory system: Secondary | ICD-10-CM | POA: Diagnosis not present

## 2024-06-24 DIAGNOSIS — Z9861 Coronary angioplasty status: Secondary | ICD-10-CM

## 2024-06-24 NOTE — Progress Notes (Signed)
 Daily Session Note  Patient Details  Name: Alex Brandt MRN: [id number] Date of Birth: Jan 23, 1976 Referring Provider:   Flowsheet Row Cardiac Rehab from 06/12/2024 in [facility] Cardiac and Pulmonary Rehab  Referring Provider Dr. Zachary Car, MD    Encounter Date: 06/24/2024  Check In:  Session Check In - 06/24/24 0725       Check-In   Supervising physician immediately available to respond to emergencies See telemetry face sheet for immediately available ER MD    Location [facility]-Cardiac & Pulmonary Rehab    Staff Present Burnard Davenport [facility] Dyane BS, ACSM CEP, Exercise Physiologist;Noah Tickle, BS, Exercise Physiologist;Joseph Rolinda RCP,RRT,BSRT    Virtual Visit No    Medication changes reported     No    Fall or balance concerns reported    No    Tobacco Cessation No Change    Warm-up and Cool-down Performed on first and last piece of equipment    Resistance Training Performed Yes    VAD Patient? No    PAD/SET Patient? No      Pain Assessment   Currently in Pain? No/denies             Social History   Tobacco Use  Smoking Status Every Day   Current packs/day: 0.50   Average packs/day: 0.5 packs/day for 20.0 years (10.0 ttl pk-yrs)   Types: Cigarettes  Smokeless Tobacco Former    Goals Met:  Independence with exercise equipment Exercise tolerated well No report of concerns or symptoms today Strength training completed today  Goals Unmet:  Not Applicable  Comments: Pt able to follow exercise prescription today without complaint.  Will continue to monitor for progression.    Dr. Oneil Pinal is Medical Director for [facility] Cardiac Rehabilitation.  Dr. Fuad Aleskerov is Medical Director for [facility] Pulmonary Rehabilitation.

## 2024-06-26 ENCOUNTER — Encounter

## 2024-06-26 DIAGNOSIS — Z9861 Coronary angioplasty status: Secondary | ICD-10-CM

## 2024-06-26 DIAGNOSIS — Z48812 Encounter for surgical aftercare following surgery on the circulatory system: Secondary | ICD-10-CM | POA: Diagnosis not present

## 2024-06-26 NOTE — Progress Notes (Signed)
 Daily Session Note  Patient Details  Name: Alex Brandt MRN: [id number] Date of Birth: 16-Mar-1976 Referring Provider:   Flowsheet Row Cardiac Rehab from 06/12/2024 in [facility] Cardiac and Pulmonary Rehab  Referring Provider Dr. Zachary Car, MD    Encounter Date: 06/26/2024  Check In:  Session Check In - 06/26/24 0725       Check-In   Supervising physician immediately available to respond to emergencies See telemetry face sheet for immediately available ER MD    Location [facility]-Cardiac & Pulmonary Rehab    Staff Present Burnard Davenport RN,BSN,MPA;Joseph [facility] RCP,RRT,BSRT;Margaret Best, MS, Exercise Physiologist    Virtual Visit No    Medication changes reported     No    Fall or balance concerns reported    No    Tobacco Cessation Use Decreased    Current number of cigarettes/nicotine  per day     5    Warm-up and Cool-down Performed on first and last piece of equipment    Resistance Training Performed Yes    VAD Patient? No    PAD/SET Patient? No      Pain Assessment   Currently in Pain? No/denies             Social History   Tobacco Use  Smoking Status Every Day   Current packs/day: 0.50   Average packs/day: 0.5 packs/day for 20.0 years (10.0 ttl pk-yrs)   Types: Cigarettes  Smokeless Tobacco Former    Goals Met:  Independence with exercise equipment Exercise tolerated well No report of concerns or symptoms today Strength training completed today  Goals Unmet:  Not Applicable  Comments: Pt able to follow exercise prescription today without complaint.  Will continue to monitor for progression.    Dr. Oneil Pinal is Medical Director for [facility] Cardiac Rehabilitation.  Dr. Fuad Aleskerov is Medical Director for [facility] Pulmonary Rehabilitation.

## 2024-06-28 ENCOUNTER — Encounter: Admitting: Emergency Medicine

## 2024-06-28 DIAGNOSIS — Z9861 Coronary angioplasty status: Secondary | ICD-10-CM

## 2024-06-28 DIAGNOSIS — Z48812 Encounter for surgical aftercare following surgery on the circulatory system: Secondary | ICD-10-CM | POA: Diagnosis not present

## 2024-06-28 NOTE — Progress Notes (Signed)
 Daily Session Note  Patient Details  Name: Alex Brandt MRN: [id number] Date of Birth: 20-Apr-1976 Referring Provider:   Flowsheet Row Cardiac Rehab from 06/12/2024 in [facility] Cardiac and Pulmonary Rehab  Referring Provider Dr. Zachary Car, MD    Encounter Date: 06/28/2024  Check In:  Session Check In - 06/28/24 0759       Check-In   Supervising physician immediately available to respond to emergencies See telemetry face sheet for immediately available ER MD    Location [facility]-Cardiac & Pulmonary Rehab    Staff Present Devaughn Jaeger, BS, Exercise Physiologist;Charisma Charlot RN,BSN;Joseph Rolinda RCP,RRT,BSRT    Virtual Visit No    Medication changes reported     No    Fall or balance concerns reported    No    Tobacco Cessation No Change    Warm-up and Cool-down Performed on first and last piece of equipment    Resistance Training Performed Yes    VAD Patient? No    PAD/SET Patient? No      Pain Assessment   Currently in Pain? No/denies             Social History   Tobacco Use  Smoking Status Every Day   Current packs/day: 0.50   Average packs/day: 0.5 packs/day for 20.0 years (10.0 ttl pk-yrs)   Types: Cigarettes  Smokeless Tobacco Former    Goals Met:  Independence with exercise equipment Exercise tolerated well No report of concerns or symptoms today Strength training completed today  Goals Unmet:  Not Applicable  Comments: Pt able to follow exercise prescription today without complaint.  Will continue to monitor for progression.    Dr. Oneil Pinal is Medical Director for [facility] Cardiac Rehabilitation.  Dr. Fuad Aleskerov is Medical Director for [facility] Pulmonary Rehabilitation.

## 2024-07-01 ENCOUNTER — Encounter

## 2024-07-01 DIAGNOSIS — Z9861 Coronary angioplasty status: Secondary | ICD-10-CM

## 2024-07-01 DIAGNOSIS — Z48812 Encounter for surgical aftercare following surgery on the circulatory system: Secondary | ICD-10-CM | POA: Diagnosis not present

## 2024-07-01 NOTE — Progress Notes (Signed)
 Daily Session Note  Patient Details  Name: Alex Brandt MRN: [id number] Date of Birth: 1975/11/21 Referring Provider:   Flowsheet Row Cardiac Rehab from 06/12/2024 in [facility] Cardiac and Pulmonary Rehab  Referring Provider Dr. Zachary Car, MD    Encounter Date: 07/01/2024  Check In:  Session Check In - 07/01/24 0717       Check-In   Supervising physician immediately available to respond to emergencies See telemetry face sheet for immediately available ER MD    Location [facility]-Cardiac & Pulmonary Rehab    Staff Present Burnard [facility]en[facility] Dyane BS, ACSM CEP, Exercise Physiologist;Joseph Rolinda RCP,RRT,BSRT;Jason Elnor RDN,LDN    Virtual Visit No    Medication changes reported     No    Fall or balance concerns reported    No    Tobacco Cessation Use Decreased    Current number of cigarettes/nicotine  per day     0    Warm-up and Cool-down Performed on first and last piece of equipment    Resistance Training Performed Yes    VAD Patient? No    PAD/SET Patient? No      Pain Assessment   Currently in Pain? No/denies             Social History   Tobacco Use  Smoking Status Every Day   Current packs/day: 0.50   Average packs/day: 0.5 packs/day for 20.0 years (10.0 ttl pk-yrs)   Types: Cigarettes  Smokeless Tobacco Former    Goals Met:  Independence with exercise equipment Exercise tolerated well No report of concerns or symptoms today Strength training completed today  Goals Unmet:  Not Applicable  Comments: Pt able to follow exercise prescription today without complaint.  Will continue to monitor for progression.    Dr. Oneil Pinal is Medical Director for [facility] Cardiac Rehabilitation.  Dr. Fuad Aleskerov is Medical Director for [facility] Pulmonary Rehabilitation.

## 2024-07-03 ENCOUNTER — Encounter

## 2024-07-03 DIAGNOSIS — Z955 Presence of coronary angioplasty implant and graft: Secondary | ICD-10-CM

## 2024-07-03 DIAGNOSIS — Z9861 Coronary angioplasty status: Secondary | ICD-10-CM

## 2024-07-03 DIAGNOSIS — I213 ST elevation (STEMI) myocardial infarction of unspecified site: Secondary | ICD-10-CM

## 2024-07-03 DIAGNOSIS — Z48812 Encounter for surgical aftercare following surgery on the circulatory system: Secondary | ICD-10-CM | POA: Diagnosis not present

## 2024-07-03 NOTE — Progress Notes (Signed)
 Daily Session Note  Patient Details  Name: Alex Brandt MRN: [id number] Date of Birth: 1976/05/13 Referring Provider:   Flowsheet Row Cardiac Rehab from 06/12/2024 in [facility] Cardiac and Pulmonary Rehab  Referring Provider Dr. Zachary Car, MD    Encounter Date: 07/03/2024  Check In:  Session Check In - 07/03/24 0714       Check-In   Supervising physician immediately available to respond to emergencies See telemetry face sheet for immediately available ER MD    Location [facility]-Cardiac & Pulmonary Rehab    Staff Present Burnard Davenport [facility] Peggi, RN, DNP, NE-BC;Joseph [facility] Best, MS, Exercise Physiologist    Virtual Visit No    Medication changes reported     No    Fall or balance concerns reported    No    Tobacco Cessation No Change    Warm-up and Cool-down Performed on first and last piece of equipment    Resistance Training Performed Yes    VAD Patient? No    PAD/SET Patient? No      Pain Assessment   Currently in Pain? No/denies             Social History   Tobacco Use  Smoking Status Every Day   Current packs/day: 0.50   Average packs/day: 0.5 packs/day for 20.0 years (10.0 ttl pk-yrs)   Types: Cigarettes  Smokeless Tobacco Former    Goals Met:  Independence with exercise equipment Exercise tolerated well No report of concerns or symptoms today Strength training completed today  Goals Unmet:  Not Applicable  Comments: Pt able to follow exercise prescription today without complaint.  Will continue to monitor for progression.    Dr. Oneil Pinal is Medical Director for [facility] Cardiac Rehabilitation.  Dr. Fuad Aleskerov is Medical Director for [facility] Pulmonary Rehabilitation.

## 2024-07-03 NOTE — Progress Notes (Signed)
 Cardiac Individual Treatment Plan  Patient Details  Name: Alex Brandt MRN: [id number] Date of Birth: 1975-11-23 Referring Provider:   Flowsheet Row Cardiac Rehab from 06/12/2024 in [facility] Cardiac and Pulmonary Rehab  Referring Provider Dr. Zachary Car, MD    Initial Encounter Date:  Flowsheet Row Cardiac Rehab from 06/12/2024 in [facility] Cardiac and Pulmonary Rehab  Date 06/12/24    Visit Diagnosis: S/P PTCA (percutaneous transluminal coronary angioplasty)  ST elevation myocardial infarction (STEMI), unspecified artery [facility])  Status post coronary artery stent placement  Patient's Home Medications on Admission:  Current Outpatient Medications:    acetaminophen  (TYLENOL ) 325 MG tablet, Take 2 tablets (650 mg total) by mouth every 6 (six) hours as needed for mild pain, fever or headache (or Fever >/= 101)., Disp: 20 tablet, Rfl: 1   aspirin  EC 81 MG tablet, Take 81 mg by mouth daily., Disp: , Rfl:    clopidogrel  (PLAVIX ) 75 MG tablet, Take 75 mg by mouth daily., Disp: , Rfl:    ezetimibe (ZETIA) 10 MG tablet, Take 10 mg by mouth daily., Disp: , Rfl:    metoprolol  succinate (TOPROL -XL) 50 MG 24 hr tablet, Take 50 mg by mouth daily., Disp: , Rfl:    nitroGLYCERIN  (NITROSTAT ) 0.4 MG SL tablet, Place 0.4 mg under the tongue every 5 (five) minutes as needed for chest pain., Disp: , Rfl:    omeprazole (PRILOSEC) 40 MG capsule, Take 40 mg by mouth daily., Disp: , Rfl:    ranolazine  (RANEXA ) 500 MG 12 hr tablet, Take 1 tablet (500 mg total) by mouth 2 (two) times daily., Disp: 60 tablet, Rfl: 0   REPATHA SURECLICK 140 MG/ML SOAJ, Inject 140 mg into the skin every 14 (fourteen) days., Disp: , Rfl:    rosuvastatin  (CRESTOR ) 40 MG tablet, Take 40 mg by mouth at bedtime., Disp: , Rfl:   Past Medical History: Past Medical History:  Diagnosis Date   Angina at rest    Arthritis    Coronary artery disease 05/05/2021   a.) LHC 05/05/2021: EF 55-65%; normal LV function and LVEDP; 50% mLAD, 50%  D1, 50% p-mRCA; med mgmt.   DJD (degenerative joint disease) of knee    Dyspnea    GERD (gastroesophageal reflux disease)    HLD (hyperlipidemia)    Hyperkalemia    Hypertension    Migraines    Myocardial infarction [facility])    Valvular insufficiency 12/18/2014   a.) TTE 12/18/2014: EF >55%; mild PR, moderate MR/TR.    Tobacco Use: Social History   Tobacco Use  Smoking Status Every Day   Current packs/day: 0.50   Average packs/day: 0.5 packs/day for 20.0 years (10.0 ttl pk-yrs)   Types: Cigarettes  Smokeless Tobacco Former    Labs: Investment banker, operational       Latest Ref Rng & Units 06/02/2022 03/12/2023  Labs for ITP Cardiac and Pulmonary Rehab  Cholestrol 0 - 200 mg/dL 821  877   LDL (calc) 0 - 99 mg/dL 875  75   HDL-C >59 mg/dL 45  35   Trlycerides <849 mg/dL 47  62   Hemoglobin J8r 4.8 - 5.6 % 5.3  5.7      Exercise Target Goals: Exercise Program Goal: Individual exercise prescription set using results from initial 6 min walk test and THRR while considering  patient's activity barriers and safety.   Exercise Prescription Goal: Initial exercise prescription builds to 30-45 minutes a day of aerobic activity, 2-3 days per week.  Home exercise guidelines will be given to  patient during program as part of exercise prescription that the participant will acknowledge.   Education: Aerobic Exercise: - Group verbal and visual presentation on the components of exercise prescription. Introduces F.I.T.T principle from ACSM for exercise prescriptions.  Reviews F.I.T.T. principles of aerobic exercise including progression. Written material provided at class time. Flowsheet Row Cardiac Rehab from 06/12/2024 in [facility] Cardiac and Pulmonary Rehab  Education need identified 06/12/24    Education: Resistance Exercise: - Group verbal and visual presentation on the components of exercise prescription. Introduces F.I.T.T principle from ACSM for exercise prescriptions  Reviews F.I.T.T. principles of  resistance exercise including progression. Written material provided at class time. Flowsheet Row Cardiac Rehab from 07/13/2022 in [facility] Cardiac and Pulmonary Rehab  Education need identified 07/06/22     Education: Exercise & Equipment Safety: - Individual verbal instruction and demonstration of equipment use and safety with use of the equipment. Flowsheet Row Cardiac Rehab from 06/12/2024 in [facility] Cardiac and Pulmonary Rehab  Date 06/12/24  Educator NT  Instruction Review Code 1- Verbalizes Understanding    Education: Exercise Physiology & General Exercise Guidelines: - Group verbal and written instruction with models to review the exercise physiology of the cardiovascular system and associated critical values. Provides general exercise guidelines with specific guidelines to those with heart or lung disease. Written material provided at class time.   Education: Flexibility, Balance, Mind/Body Relaxation: - Group verbal and visual presentation with interactive activity on the components of exercise prescription. Introduces F.I.T.T principle from ACSM for exercise prescriptions. Reviews F.I.T.T. principles of flexibility and balance exercise training including progression. Also discusses the mind body connection.  Reviews various relaxation techniques to help reduce and manage stress (i.e. Deep breathing, progressive muscle relaxation, and visualization). Balance handout provided to take home. Written material provided at class time.   Activity Barriers & Risk Stratification:  Activity Barriers & Cardiac Risk Stratification - 06/11/24 1305       Activity Barriers & Cardiac Risk Stratification   Activity Barriers Right Knee Replacement;Joint Problems    Cardiac Risk Stratification High          6 Minute Walk:  6 Minute Walk     Row Name 06/12/24 0912         6 Minute Walk   Phase Initial     Distance 1580 feet     Walk Time 6 minutes     # of Rest Breaks 0     MPH 2.99      METS 4.85     RPE 13     Perceived Dyspnea  1     VO2 Peak 16.97     Symptoms Yes (comment)     Comments Right knee pain 4/10     Resting HR 71 bpm     Resting BP 112/68     Resting Oxygen Saturation  99 %     Exercise Oxygen Saturation  during 6 min walk 99 %     Max Ex. HR 112 bpm     Max Ex. BP 126/68     2 Minute Post BP 108/66        Oxygen Initial Assessment:   Oxygen Re-Evaluation:   Oxygen Discharge (Final Oxygen Re-Evaluation):   Initial Exercise Prescription:  Initial Exercise Prescription - 06/12/24 0900       Date of Initial Exercise RX and Referring Provider   Date 06/12/24    Referring Provider Dr. Zachary Car, MD      Oxygen   Maintain Oxygen  Saturation 88% or higher      Treadmill   MPH 3    Grade 1    Minutes 15    METs 3.71      NuStep   Level 4   T6   SPM 80    Minutes 15    METs 4.85      REL-XR   Level 4    Speed 50    Minutes 15    METs 4.85      Rower   Level 8    Watts 50    Minutes 15    METs 4.85      Prescription Details   Frequency (times per week) 3    Duration Progress to 30 minutes of continuous aerobic without signs/symptoms of physical distress      Intensity   THRR 40-80% of Max Heartrate 112-153    Ratings of Perceived Exertion 11-13    Perceived Dyspnea 0-4      Progression   Progression Continue to progress workloads to maintain intensity without signs/symptoms of physical distress.      Resistance Training   Training Prescription Yes    Weight 8 lb    Reps 10-15          Perform Capillary Blood Glucose checks as needed.  Exercise Prescription Changes:   Exercise Prescription Changes     Row Name 06/12/24 0900             Response to Exercise   Blood Pressure (Admit) 112/68       Blood Pressure (Exercise) 126/68       Blood Pressure (Exit) 108/66       Heart Rate (Admit) 71 bpm       Heart Rate (Exercise) 112 bpm       Heart Rate (Exit) 79 bpm       Oxygen Saturation (Admit) 99  %       Oxygen Saturation (Exercise) 99 %       Rating of Perceived Exertion (Exercise) 13       Perceived Dyspnea (Exercise) 1       Symptoms R knee pain 4/10       Comments Results          Exercise Comments:   Exercise Comments     Row Name 06/17/24 0840           Exercise Comments First full day of exercise!  Patient was oriented to gym and equipment including functions, settings, policies, and procedures.  Patient's individual exercise prescription and treatment plan were reviewed.  All starting workloads were established based on the results of the 6 minute walk test done at initial orientation visit.  The plan for exercise progression was also introduced and progression will be customized based on patient's performance and goals.          Exercise Goals and Review:   Exercise Goals     Row Name 06/12/24 0913             Exercise Goals   Increase Physical Activity Yes       Intervention Provide advice, education, support and counseling about physical activity/exercise needs.;Develop an individualized exercise prescription for aerobic and resistive training based on initial evaluation findings, risk stratification, comorbidities and participant's personal goals.       Expected Outcomes Short Term: Attend rehab on a regular basis to increase amount of physical activity.;Long Term: Add in home exercise to make exercise part of  routine and to increase amount of physical activity.;Long Term: Exercising regularly at least 3-5 days a week.       Increase Strength and Stamina Yes       Intervention Provide advice, education, support and counseling about physical activity/exercise needs.;Develop an individualized exercise prescription for aerobic and resistive training based on initial evaluation findings, risk stratification, comorbidities and participant's personal goals.       Expected Outcomes Short Term: Increase workloads from initial exercise prescription for  resistance, speed, and METs.;Short Term: Perform resistance training exercises routinely during rehab and add in resistance training at home;Long Term: Improve cardiorespiratory fitness, muscular endurance and strength as measured by increased METs and functional capacity ( )       Able to understand and use rate of perceived exertion (RPE) scale Yes       Intervention Provide education and explanation on how to use RPE scale       Expected Outcomes Short Term: Able to use RPE daily in rehab to express subjective intensity level;Long Term:  Able to use RPE to guide intensity level when exercising independently       Able to understand and use Dyspnea scale Yes       Intervention Provide education and explanation on how to use Dyspnea scale       Expected Outcomes Short Term: Able to use Dyspnea scale daily in rehab to express subjective sense of shortness of breath during exertion;Long Term: Able to use Dyspnea scale to guide intensity level when exercising independently       Knowledge and understanding of Target Heart Rate Range (THRR) Yes       Intervention Provide education and explanation of THRR including how the numbers were predicted and where they are located for reference       Expected Outcomes Short Term: Able to state/look up THRR;Long Term: Able to use THRR to govern intensity when exercising independently;Short Term: Able to use daily as guideline for intensity in rehab       Able to check pulse independently Yes       Intervention Provide education and demonstration on how to check pulse in carotid and radial arteries.;Review the importance of being able to check your own pulse for safety during independent exercise       Expected Outcomes Short Term: Able to explain why pulse checking is important during independent exercise;Long Term: Able to check pulse independently and accurately       Understanding of Exercise Prescription Yes       Intervention Provide education, explanation,  and written materials on patient's individual exercise prescription       Expected Outcomes Short Term: Able to explain program exercise prescription;Long Term: Able to explain home exercise prescription to exercise independently          Exercise Goals Re-Evaluation :  Exercise Goals Re-Evaluation     Row Name 06/17/24 0840             Exercise Goal Re-Evaluation   Exercise Goals Review Able to understand and use rate of perceived exertion (RPE) scale;Knowledge and understanding of Target Heart Rate Range (THRR);Understanding of Exercise Prescription;Increase Physical Activity;Increase Strength and Stamina;Able to understand and use Dyspnea scale;Able to check pulse independently       Comments Reviewed RPE and dyspnea scale, THR and program prescription with pt today.  Pt voiced understanding and was given a copy of goals to take home.       Expected Outcomes Short: Use RPE  daily to regulate intensity. Long: Follow program prescription in THR.          Discharge Exercise Prescription (Final Exercise Prescription Changes):  Exercise Prescription Changes - 06/12/24 0900       Response to Exercise   Blood Pressure (Admit) 112/68    Blood Pressure (Exercise) 126/68    Blood Pressure (Exit) 108/66    Heart Rate (Admit) 71 bpm    Heart Rate (Exercise) 112 bpm    Heart Rate (Exit) 79 bpm    Oxygen Saturation (Admit) 99 %    Oxygen Saturation (Exercise) 99 %    Rating of Perceived Exertion (Exercise) 13    Perceived Dyspnea (Exercise) 1    Symptoms R knee pain 4/10    Comments Results          Nutrition:  Target Goals: Understanding of nutrition guidelines, daily intake of sodium 1500mg , cholesterol 200mg , calories 30% from fat and 7% or less from saturated fats, daily to have 5 or more servings of fruits and vegetables.  Education: Nutrition 1 -Group instruction provided by verbal, written material, interactive activities, discussions, models, and posters to present  general guidelines for heart healthy nutrition including macronutrients, label reading, and promoting whole foods over processed counterparts. Education serves as Pensions consultant of discussion of heart healthy eating for all. Written material provided at class time.    Education: Nutrition 2 -Group instruction provided by verbal, written material, interactive activities, discussions, models, and posters to present general guidelines for heart healthy nutrition including sodium, cholesterol, and saturated fat. Providing guidance of habit forming to improve blood pressure, cholesterol, and body weight. Written material provided at class time.     Biometrics:  Pre Biometrics - 06/12/24 0914       Pre Biometrics   Height 6' 3.8 (1.925 m)    Weight 230 lb 3.2 oz (104.4 kg)    Waist Circumference 42 inches    Hip Circumference 41 inches    Waist to Hip Ratio 1.02 %    BMI (Calculated) 28.18    Single Leg Stand 30 seconds           Nutrition Therapy Plan and Nutrition Goals:  Nutrition Therapy & Goals - 06/12/24 0901       Intervention Plan   Intervention Prescribe, educate and counsel regarding individualized specific dietary modifications aiming towards targeted core components such as weight, hypertension, lipid management, diabetes, heart failure and other comorbidities.    Expected Outcomes Short Term Goal: Understand basic principles of dietary content, such as calories, fat, sodium, cholesterol and nutrients.;Short Term Goal: A plan has been developed with personal nutrition goals set during dietitian appointment.;Long Term Goal: Adherence to prescribed nutrition plan.          Nutrition Assessments:  MEDIFICTS Score Key: >=70 Need to make dietary changes  40-70 Heart Healthy Diet <= 40 Therapeutic Level Cholesterol Diet  Flowsheet Row Cardiac Rehab from 06/12/2024 in [facility] Cardiac and Pulmonary Rehab  Picture Your Plate Total Score on Admission 49   Picture Your Plate  Scores: <59 Unhealthy dietary pattern with much room for improvement. 41-50 Dietary pattern unlikely to meet recommendations for good health and room for improvement. 51-60 More healthful dietary pattern, with some room for improvement.  >60 Healthy dietary pattern, although there may be some specific behaviors that could be improved.    Nutrition Goals Re-Evaluation:   Nutrition Goals Discharge (Final Nutrition Goals Re-Evaluation):   Psychosocial: Target Goals: Acknowledge presence or absence of significant depression  and/or stress, maximize coping skills, provide positive support system. Participant is able to verbalize types and ability to use techniques and skills needed for reducing stress and depression.   Education: Stress, Anxiety, and Depression - Group verbal and visual presentation to define topics covered.  Reviews how body is impacted by stress, anxiety, and depression.  Also discusses healthy ways to reduce stress and to treat/manage anxiety and depression. Written material provided at class time. Flowsheet Row Cardiac Rehab from 06/12/2024 in [facility] Cardiac and Pulmonary Rehab  Education need identified 06/12/24    Education: Sleep Hygiene -Provides group verbal and written instruction about how sleep can affect your health.  Define sleep hygiene, discuss sleep cycles and impact of sleep habits. Review good sleep hygiene tips.   Initial Review & Psychosocial Screening:  Initial Psych Review & Screening - 06/11/24 1311       Initial Review   Current issues with Current Stress Concerns    Source of Stress Concerns Financial      Family Dynamics   Good Support System? Yes      Barriers   Psychosocial barriers to participate in program There are no identifiable barriers or psychosocial needs.      Screening Interventions   Interventions Encouraged to exercise;To provide support and resources with identified psychosocial needs;Provide feedback about the scores to  participant    Expected Outcomes Short Term goal: Utilizing psychosocial counselor, staff and physician to assist with identification of specific Stressors or current issues interfering with healing process. Setting desired goal for each stressor or current issue identified.;Long Term Goal: Stressors or current issues are controlled or eliminated.;Short Term goal: Identification and review with participant of any Quality of Life or Depression concerns found by scoring the questionnaire.;Long Term goal: The participant improves quality of Life and PHQ9 Scores as seen by post scores and/or verbalization of changes          Quality of Life Scores:   Quality of Life - 06/12/24 0900       Quality of Life   Select Quality of Life      Quality of Life Scores   Health/Function Pre 29.6 %    Socioeconomic Pre 30 %    Psych/Spiritual Pre 30 %    Family Pre 30 %    GLOBAL Pre 29.83 %         Scores of 19 and below usually indicate a poorer quality of life in these areas.  A difference of  2-3 points is a clinically meaningful difference.  A difference of 2-3 points in the total score of the Quality of Life Index has been associated with significant improvement in overall quality of life, self-image, physical symptoms, and general health in studies assessing change in quality of life.  PHQ-9: Review Flowsheet       06/12/2024 07/06/2022  Depression screen PHQ 2/9  Decreased Interest 0 1  Down, Depressed, Hopeless 0 0  PHQ - 2 Score 0 1  Altered sleeping 1 2  Tired, decreased energy 1 2  Change in appetite 1 2  Feeling bad or failure about yourself  0 0  Trouble concentrating 0 0  Moving slowly or fidgety/restless 0 0  Suicidal thoughts 0 0  PHQ-9 Score 3 7  Difficult doing work/chores Not difficult at all Somewhat difficult   Interpretation of Total Score  Total Score Depression Severity:  1-4 = Minimal depression, 5-9 = Mild depression, 10-14 = Moderate depression, 15-19 = Moderately  severe depression, 20-27 =  Severe depression   Psychosocial Evaluation and Intervention:  Psychosocial Evaluation - 06/11/24 1322       Psychosocial Evaluation & Interventions   Interventions Encouraged to exercise with the program and follow exercise prescription;Stress management education    Comments Mr. Nijjar is coming to cardiac rehab after a stent placement. He is working on quitting smoking and losing weight. He recently got a new job, but has been out of work for a month so the financial stress has been a struggle recently. He started back to work today and feels this will help once his paycheck comes in. His job is supportive of him attending the program and has already worked his schedule around coming three times a week. 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 He mentions that he does feel better eating throughout the day instead of waiting til dinner time. He is motivated to attend the program    Expected Outcomes Short: attend cardiac rehab for education and exercise Long: develop and maintain positive self care habits    Continue Psychosocial Services  Follow up required by staff          Psychosocial Re-Evaluation:   Psychosocial Discharge (Final Psychosocial Re-Evaluation):   Vocational Rehabilitation: Provide vocational rehab assistance to qualifying candidates.   Vocational Rehab Evaluation & Intervention:  Vocational Rehab - 06/11/24 1309       Initial Vocational Rehab Evaluation & Intervention   Assessment shows need for Vocational Rehabilitation No          Education: Education Goals: Education classes will be provided on a variety of topics geared toward better understanding of heart health and risk factor modification. Participant will state understanding/return demonstration of topics presented as noted by education test scores.  Learning  Barriers/Preferences:  Learning Barriers/Preferences - 06/11/24 1309       Learning Barriers/Preferences   Learning Barriers None    Learning Preferences None          General Cardiac Education Topics:  AED/CPR: - Group verbal and written instruction with the use of models to demonstrate the basic use of the AED with the basic ABC's of resuscitation.   Test and Procedures: - Group verbal and visual presentation and models provide information about basic cardiac anatomy and function. Reviews the testing methods done to diagnose heart disease and the outcomes of the test results. Describes the treatment choices: Medical Management, Angioplasty, or Coronary Bypass Surgery for treating various heart conditions including Myocardial Infarction, Angina, Valve Disease, and Cardiac Arrhythmias. Written material provided at class time. Flowsheet Row Cardiac Rehab from 06/12/2024 in [facility] Cardiac and Pulmonary Rehab  Education need identified 06/12/24    Medication Safety: - Group verbal and visual instruction to review commonly prescribed medications for heart and lung disease. Reviews the medication, class of the drug, and side effects. Includes the steps to properly store meds and maintain the prescription regimen. Written material provided at class time. Flowsheet Row Cardiac Rehab from 07/13/2022 in [facility] Cardiac and Pulmonary Rehab  Date 07/13/22  Educator SB  Instruction Review Code 1- Verbalizes Understanding    Intimacy: - Group verbal instruction through game format to discuss how heart and lung disease can affect sexual intimacy. Written material provided at class time.   Know Your Numbers and Heart Failure: - Group verbal and visual instruction to discuss disease risk factors for cardiac and pulmonary disease and treatment  options.  Reviews associated critical values for Overweight/Obesity, Hypertension, Cholesterol, and Diabetes.  Discusses basics of heart failure: signs/symptoms  and treatments.  Introduces Heart Failure Zone chart for action plan for heart failure. Written material provided at class time. Flowsheet Row Cardiac Rehab from 07/13/2022 in [facility] Cardiac and Pulmonary Rehab  Education need identified 07/06/22    Infection Prevention: - Provides verbal and written material to individual with discussion of infection control including proper hand washing and proper equipment cleaning during exercise session. Flowsheet Row Cardiac Rehab from 06/12/2024 in [facility] Cardiac and Pulmonary Rehab  Date 06/12/24  Educator NT  Instruction Review Code 1- Verbalizes Understanding    Falls Prevention: - Provides verbal and written material to individual with discussion of falls prevention and safety. Flowsheet Row Cardiac Rehab from 06/12/2024 in [facility] Cardiac and Pulmonary Rehab  Date 06/12/24  Educator NT  Instruction Review Code 1- Verbalizes Understanding    Other: -Provides group and verbal instruction on various topics (see comments)   Knowledge Questionnaire Score:  Knowledge Questionnaire Score - 06/12/24 0859       Knowledge Questionnaire Score   Pre Score 20/26          Core Components/Risk Factors/Patient Goals at Admission:  Personal Goals and Risk Factors at Admission - 06/11/24 1306       Core Components/Risk Factors/Patient Goals on Admission    Weight Management Yes;Weight Loss    Intervention Weight Management: Develop a combined nutrition and exercise program designed to reach desired caloric intake, while maintaining appropriate intake of nutrient and fiber, sodium and fats, and appropriate energy expenditure required for the weight goal.;Weight Management: Provide education and appropriate resources to help participant work on and attain dietary goals.;Weight Management/Obesity: Establish reasonable short term and long term weight goals.    Goal Weight: Long Term 185 lb (83.9 kg)    Expected Outcomes Short Term: Continue to assess and modify  interventions until short term weight is achieved;Long Term: Adherence to nutrition and physical activity/exercise program aimed toward attainment of established weight goal;Weight Loss: Understanding of general recommendations for a balanced deficit meal plan, which promotes 1-2 lb weight loss per week and includes a negative energy balance of [phone] kcal/d;Understanding of distribution of calorie intake throughout the day with the consumption of 4-5 meals/snacks;Understanding recommendations for meals to include 15-35% energy as protein, 25-35% energy from fat, 35-60% energy from carbohydrates, less than 200mg  of dietary cholesterol, 20-35 gm of total fiber daily    Tobacco Cessation Yes    Number of packs per day 0.5    Intervention Assist the participant in steps to quit. Provide individualized education and counseling about committing to Tobacco Cessation, relapse prevention, and pharmacological support that can be provided by physician.;Education officer, environmental, assist with locating and accessing local/national Quit Smoking programs, and support quit date choice.    Expected Outcomes Short Term: Will demonstrate readiness to quit, by selecting a quit date.;Short Term: Will quit all tobacco product use, adhering to prevention of relapse plan.;Long Term: Complete abstinence from all tobacco products for at least 12 months from quit date.    Hypertension Yes    Intervention Provide education on lifestyle modifcations including regular physical activity/exercise, weight management, moderate sodium restriction and increased consumption of fresh fruit, vegetables, and low fat dairy, alcohol moderation, and smoking cessation.;Monitor prescription use compliance.    Expected Outcomes Short Term: Continued assessment and intervention until BP is < 140/11mm HG in hypertensive participants. < 130/82mm HG in hypertensive participants with diabetes,  heart failure or chronic kidney disease.;Long Term:  Maintenance of blood pressure at goal levels.    Lipids Yes    Intervention Provide education and support for participant on nutrition & aerobic/resistive exercise along with prescribed medications to achieve LDL 70mg , HDL >40mg .    Expected Outcomes Short Term: Participant states understanding of desired cholesterol values and is compliant with medications prescribed. Participant is following exercise prescription and nutrition guidelines.;Long Term: Cholesterol controlled with medications as prescribed, with individualized exercise RX and with personalized nutrition plan. Value goals: LDL < 70mg , HDL > 40 mg.          Education:Diabetes - Individual verbal and written instruction to review signs/symptoms of diabetes, desired ranges of glucose level fasting, after meals and with exercise. Acknowledge that pre and post exercise glucose checks will be done for 3 sessions at entry of program.   Core Components/Risk Factors/Patient Goals Review:    Core Components/Risk Factors/Patient Goals at Discharge (Final Review):    ITP Comments:  ITP Comments     Row Name 06/11/24 1333 06/12/24 0855 06/17/24 0840 07/03/24 1118     ITP Comments Initial phone call completed. Diagnosis can be found in [facility] 05/18/24. EP Orientation scheduled for Wednesday 9/3 at 8am Completed and gym orientation for cardiac rehab. Initial ITP created and sent for review to Dr. Oneil Pinal, Medical Director. First full day of exercise!  Patient was oriented to gym and equipment including functions, settings, policies, and procedures.  Patient's individual exercise prescription and treatment plan were reviewed.  All starting workloads were established based on the results of the 6 minute walk test done at initial orientation visit.  The plan for exercise progression was also introduced and progression will be customized based on patient's performance and goals. 30 Day review completed. Medical Director ITP review done;  changes made as directed and signed approval by Medical Director. New to program.       Comments: 30 day review

## 2024-07-05 ENCOUNTER — Encounter

## 2024-07-08 ENCOUNTER — Encounter

## 2024-07-08 DIAGNOSIS — Z48812 Encounter for surgical aftercare following surgery on the circulatory system: Secondary | ICD-10-CM | POA: Diagnosis not present

## 2024-07-08 DIAGNOSIS — Z9861 Coronary angioplasty status: Secondary | ICD-10-CM

## 2024-07-08 NOTE — Progress Notes (Signed)
 Daily Session Note  Patient Details  Name: IZAAC REISIG MRN: [id number] Date of Birth: [birth year] Referring Provider:   Flowsheet Row Cardiac Rehab from 06/12/2024 in [facility] Cardiac and Pulmonary Rehab  Referring Provider Dr. Zachary Car, MD    Encounter Date: 07/08/2024  Check In:  Session Check In - 07/08/24 0723       Check-In   Supervising physician immediately available to respond to emergencies See telemetry face sheet for immediately available ER MD    Location [facility]-Cardiac & Pulmonary Rehab    Staff Present Ronal Picket, RN, DNP, NE-BC;Denney Shein RN,BSN,MPA;Birney Belshe Dyane BS, ACSM CEP, Exercise Physiologist;Joseph Rolinda RCP,RRT,BSRT;Jason Elnor RDN,LDN    Virtual Visit No    Medication changes reported     No    Fall or balance concerns reported    No    Tobacco Cessation No Change    Warm-up and Cool-down Performed on first and last piece of equipment    Resistance Training Performed Yes    VAD Patient? No    PAD/SET Patient? No      Pain Assessment   Currently in Pain? No/denies             Social History   Tobacco Use  Smoking Status Every Day   Current packs/day: 0.50   Average packs/day: 0.5 packs/day for 20.0 years (10.0 ttl pk-yrs)   Types: Cigarettes  Smokeless Tobacco Former    Goals Met:  Independence with exercise equipment Exercise tolerated well No report of concerns or symptoms today Strength training completed today  Goals Unmet:  Not Applicable  Comments: Pt able to follow exercise prescription today without complaint.  Will continue to monitor for progression.    Dr. Oneil Pinal is Medical Director for [facility] Cardiac Rehabilitation.  Dr. Fuad Aleskerov is Medical Director for [facility] Pulmonary Rehabilitation.

## 2024-07-10 ENCOUNTER — Encounter

## 2024-07-12 ENCOUNTER — Encounter

## 2024-07-15 ENCOUNTER — Encounter: Attending: Cardiology

## 2024-07-15 DIAGNOSIS — Z48812 Encounter for surgical aftercare following surgery on the circulatory system: Secondary | ICD-10-CM | POA: Insufficient documentation

## 2024-07-15 DIAGNOSIS — I2510 Atherosclerotic heart disease of native coronary artery without angina pectoris: Secondary | ICD-10-CM | POA: Insufficient documentation

## 2024-07-15 DIAGNOSIS — Z9861 Coronary angioplasty status: Secondary | ICD-10-CM | POA: Insufficient documentation

## 2024-07-17 ENCOUNTER — Encounter

## 2024-07-19 ENCOUNTER — Encounter

## 2024-07-22 ENCOUNTER — Encounter

## 2024-07-24 ENCOUNTER — Encounter

## 2024-07-26 ENCOUNTER — Telehealth: Payer: Self-pay

## 2024-07-26 ENCOUNTER — Encounter

## 2024-07-26 NOTE — Telephone Encounter (Signed)
 We called Pt today as he has not attended rehab since 07/08/2024. There was no answer. We left a message requesting that he call us  back to update us  on his status in the program.

## 2024-07-29 ENCOUNTER — Encounter

## 2024-07-31 ENCOUNTER — Telehealth: Payer: Self-pay

## 2024-07-31 ENCOUNTER — Encounter

## 2024-07-31 ENCOUNTER — Encounter: Payer: Self-pay | Admitting: *Deleted

## 2024-07-31 DIAGNOSIS — Z9861 Coronary angioplasty status: Secondary | ICD-10-CM

## 2024-07-31 NOTE — Telephone Encounter (Signed)
 Called Patient, no answer. Not attended Rehab since 07/08/2024. Have not heard from him, will send discharge letter today informing him of discharge on 08/07/2024.

## 2024-07-31 NOTE — Progress Notes (Signed)
 Cardiac Individual Treatment Plan  Patient Details  Name: Alex Brandt MRN: [id number] Date of Birth: [birth year] Referring Provider:   Flowsheet Row Cardiac Rehab from 06/12/2024 in [facility] Cardiac and Pulmonary Rehab  Referring Provider Dr. Zachary Car, MD    Initial Encounter Date:  Flowsheet Row Cardiac Rehab from 06/12/2024 in [facility] Cardiac and Pulmonary Rehab  Date 06/12/24    Visit Diagnosis: S/P PTCA (percutaneous transluminal coronary angioplasty)  Patient's Home Medications on Admission:  Current Outpatient Medications:    acetaminophen  (TYLENOL ) 325 MG tablet, Take 2 tablets (650 mg total) by mouth every 6 (six) hours as needed for mild pain, fever or headache (or Fever >/= 101)., Disp: 20 tablet, Rfl: 1   aspirin  EC 81 MG tablet, Take 81 mg by mouth daily., Disp: , Rfl:    clopidogrel  (PLAVIX ) 75 MG tablet, Take 75 mg by mouth daily., Disp: , Rfl:    ezetimibe (ZETIA) 10 MG tablet, Take 10 mg by mouth daily., Disp: , Rfl:    metoprolol  succinate (TOPROL -XL) 50 MG 24 hr tablet, Take 50 mg by mouth daily., Disp: , Rfl:    nitroGLYCERIN  (NITROSTAT ) 0.4 MG SL tablet, Place 0.4 mg under the tongue every 5 (five) minutes as needed for chest pain., Disp: , Rfl:    omeprazole (PRILOSEC) 40 MG capsule, Take 40 mg by mouth daily., Disp: , Rfl:    ranolazine  (RANEXA ) 500 MG 12 hr tablet, Take 1 tablet (500 mg total) by mouth 2 (two) times daily., Disp: 60 tablet, Rfl: 0   REPATHA SURECLICK 140 MG/ML SOAJ, Inject 140 mg into the skin every 14 (fourteen) days., Disp: , Rfl:    rosuvastatin  (CRESTOR ) 40 MG tablet, Take 40 mg by mouth at bedtime., Disp: , Rfl:   Past Medical History: Past Medical History:  Diagnosis Date   Angina at rest    Arthritis    Coronary artery disease 05/05/2021   a.) LHC 05/05/2021: EF 55-65%; normal LV function and LVEDP; 50% mLAD, 50% D1, 50% p-mRCA; med mgmt.   DJD (degenerative joint disease) of knee    Dyspnea    GERD (gastroesophageal reflux  disease)    HLD (hyperlipidemia)    Hyperkalemia    Hypertension    Migraines    Myocardial infarction [facility])    Valvular insufficiency 12/18/2014   a.) TTE 12/18/2014: EF >55%; mild PR, moderate MR/TR.    Tobacco Use: Social History   Tobacco Use  Smoking Status Every Day   Current packs/day: 0.50   Average packs/day: 0.5 packs/day for 20.0 years (10.0 ttl pk-yrs)   Types: Cigarettes  Smokeless Tobacco Former    Labs: Investment banker, operational       Latest Ref Rng & Units 06/02/2022 03/12/2023  Labs for ITP Cardiac and Pulmonary Rehab  Cholestrol 0 - 200 mg/dL 821  877   LDL (calc) 0 - 99 mg/dL 875  75   HDL-C >59 mg/dL 45  35   Trlycerides <849 mg/dL 47  62   Hemoglobin J8r 4.8 - 5.6 % 5.3  5.7      Exercise Target Goals: Exercise Program Goal: Individual exercise prescription set using results from initial 6 min walk test and THRR while considering  patient's activity barriers and safety.   Exercise Prescription Goal: Initial exercise prescription builds to 30-45 minutes a day of aerobic activity, 2-3 days per week.  Home exercise guidelines will be given to patient during program as part of exercise prescription that the participant will acknowledge.   Education:  Aerobic Exercise: - Group verbal and visual presentation on the components of exercise prescription. Introduces F.I.T.T principle from ACSM for exercise prescriptions.  Reviews F.I.T.T. principles of aerobic exercise including progression. Written material provided at class time. Flowsheet Row Cardiac Rehab from 06/12/2024 in [facility] Cardiac and Pulmonary Rehab  Education need identified 06/12/24    Education: Resistance Exercise: - Group verbal and visual presentation on the components of exercise prescription. Introduces F.I.T.T principle from ACSM for exercise prescriptions  Reviews F.I.T.T. principles of resistance exercise including progression. Written material provided at class time. Flowsheet Row Cardiac Rehab  from 07/13/2022 in [facility] Cardiac and Pulmonary Rehab  Education need identified 07/06/22     Education: Exercise & Equipment Safety: - Individual verbal instruction and demonstration of equipment use and safety with use of the equipment. Flowsheet Row Cardiac Rehab from 06/12/2024 in [facility] Cardiac and Pulmonary Rehab  Date 06/12/24  Educator NT  Instruction Review Code 1- Verbalizes Understanding    Education: Exercise Physiology & General Exercise Guidelines: - Group verbal and written instruction with models to review the exercise physiology of the cardiovascular system and associated critical values. Provides general exercise guidelines with specific guidelines to those with heart or lung disease. Written material provided at class time.   Education: Flexibility, Balance, Mind/Body Relaxation: - Group verbal and visual presentation with interactive activity on the components of exercise prescription. Introduces F.I.T.T principle from ACSM for exercise prescriptions. Reviews F.I.T.T. principles of flexibility and balance exercise training including progression. Also discusses the mind body connection.  Reviews various relaxation techniques to help reduce and manage stress (i.e. Deep breathing, progressive muscle relaxation, and visualization). Balance handout provided to take home. Written material provided at class time.   Activity Barriers & Risk Stratification:  Activity Barriers & Cardiac Risk Stratification - 06/11/24 1305       Activity Barriers & Cardiac Risk Stratification   Activity Barriers Right Knee Replacement;Joint Problems    Cardiac Risk Stratification High          6 Minute Walk:  6 Minute Walk     Row Name 06/12/24 0912         6 Minute Walk   Phase Initial     Distance 1580 feet     Walk Time 6 minutes     # of Rest Breaks 0     MPH 2.99     METS 4.85     RPE 13     Perceived Dyspnea  1     VO2 Peak 16.97     Symptoms Yes (comment)     Comments Right  knee pain 4/10     Resting HR 71 bpm     Resting BP 112/68     Resting Oxygen Saturation  99 %     Exercise Oxygen Saturation  during 6 min walk 99 %     Max Ex. HR 112 bpm     Max Ex. BP 126/68     2 Minute Post BP 108/66        Oxygen Initial Assessment:   Oxygen Re-Evaluation:   Oxygen Discharge (Final Oxygen Re-Evaluation):   Initial Exercise Prescription:  Initial Exercise Prescription - 06/12/24 0900       Date of Initial Exercise RX and Referring Provider   Date 06/12/24    Referring Provider Dr. Zachary Car, MD      Oxygen   Maintain Oxygen Saturation 88% or higher      Treadmill   MPH 3  Grade 1    Minutes 15    METs 3.71      NuStep   Level 4   T6   SPM 80    Minutes 15    METs 4.85      REL-XR   Level 4    Speed 50    Minutes 15    METs 4.85      Rower   Level 8    Watts 50    Minutes 15    METs 4.85      Prescription Details   Frequency (times per week) 3    Duration Progress to 30 minutes of continuous aerobic without signs/symptoms of physical distress      Intensity   THRR 40-80% of Max Heartrate 112-153    Ratings of Perceived Exertion 11-13    Perceived Dyspnea 0-4      Progression   Progression Continue to progress workloads to maintain intensity without signs/symptoms of physical distress.      Resistance Training   Training Prescription Yes    Weight 8 lb    Reps 10-15          Perform Capillary Blood Glucose checks as needed.  Exercise Prescription Changes:   Exercise Prescription Changes     Row Name 06/12/24 0900 07/04/24 0700 07/18/24 1600         Response to Exercise   Blood Pressure (Admit) 112/68 110/62 94/60     Blood Pressure (Exercise) 126/68 132/60 122/58     Blood Pressure (Exit) 108/66 92/52 92/62      Heart Rate (Admit) 71 bpm 83 bpm 72 bpm     Heart Rate (Exercise) 112 bpm 134 bpm 132 bpm     Heart Rate (Exit) 79 bpm 79 bpm 88 bpm     Oxygen Saturation (Admit) 99 % -- --     Oxygen  Saturation (Exercise) 99 % -- --     Rating of Perceived Exertion (Exercise) 13 13 17      Perceived Dyspnea (Exercise) 1 -- --     Symptoms R knee pain 4/10 none none     Comments Results 1st 2 weeks of exercise sessions --     Duration -- Progress to 30 minutes of  aerobic without signs/symptoms of physical distress Progress to 30 minutes of  aerobic without signs/symptoms of physical distress     Intensity -- THRR unchanged THRR unchanged       Progression   Progression -- Continue to progress workloads to maintain intensity without signs/symptoms of physical distress. Continue to progress workloads to maintain intensity without signs/symptoms of physical distress.     Average METs -- 4.38 4.01       Resistance Training   Training Prescription -- Yes Yes     Weight -- 8 lb 8 lb     Reps -- 10-15 10-15       Interval Training   Interval Training -- No No       Treadmill   MPH -- 3 3     Grade -- 7 1     Minutes -- 15 15     METs -- 6.19 3.71       NuStep   Level -- 3  T6 3  T6     Minutes -- 15 15     METs -- 4.5 3.5       REL-XR   Level -- 6 4     Minutes -- 15 15  METs -- 4.9 6       Rower   Level -- 7 5     Watts -- 44 44     Minutes -- 15 15     METs -- 4.96 4.98       Track   Laps -- -- 27     Minutes -- -- 15     METs -- -- 2.47       Oxygen   Maintain Oxygen Saturation -- 88% or higher 88% or higher        Exercise Comments:   Exercise Comments     Row Name 06/17/24 0840           Exercise Comments First full day of exercise!  Patient was oriented to gym and equipment including functions, settings, policies, and procedures.  Patient's individual exercise prescription and treatment plan were reviewed.  All starting workloads were established based on the results of the 6 minute walk test done at initial orientation visit.  The plan for exercise progression was also introduced and progression will be customized based on patient's performance  and goals.          Exercise Goals and Review:   Exercise Goals     Row Name 06/12/24 0913             Exercise Goals   Increase Physical Activity Yes       Intervention Provide advice, education, support and counseling about physical activity/exercise needs.;Develop an individualized exercise prescription for aerobic and resistive training based on initial evaluation findings, risk stratification, comorbidities and participant's personal goals.       Expected Outcomes Short Term: Attend rehab on a regular basis to increase amount of physical activity.;Long Term: Add in home exercise to make exercise part of routine and to increase amount of physical activity.;Long Term: Exercising regularly at least 3-5 days a week.       Increase Strength and Stamina Yes       Intervention Provide advice, education, support and counseling about physical activity/exercise needs.;Develop an individualized exercise prescription for aerobic and resistive training based on initial evaluation findings, risk stratification, comorbidities and participant's personal goals.       Expected Outcomes Short Term: Increase workloads from initial exercise prescription for resistance, speed, and METs.;Short Term: Perform resistance training exercises routinely during rehab and add in resistance training at home;Long Term: Improve cardiorespiratory fitness, muscular endurance and strength as measured by increased METs and functional capacity ( )       Able to understand and use rate of perceived exertion (RPE) scale Yes       Intervention Provide education and explanation on how to use RPE scale       Expected Outcomes Short Term: Able to use RPE daily in rehab to express subjective intensity level;Long Term:  Able to use RPE to guide intensity level when exercising independently       Able to understand and use Dyspnea scale Yes       Intervention Provide education and explanation on how to use Dyspnea scale        Expected Outcomes Short Term: Able to use Dyspnea scale daily in rehab to express subjective sense of shortness of breath during exertion;Long Term: Able to use Dyspnea scale to guide intensity level when exercising independently       Knowledge and understanding of Target Heart Rate Range (THRR) Yes       Intervention Provide education and explanation of THRR including  how the numbers were predicted and where they are located for reference       Expected Outcomes Short Term: Able to state/look up THRR;Long Term: Able to use THRR to govern intensity when exercising independently;Short Term: Able to use daily as guideline for intensity in rehab       Able to check pulse independently Yes       Intervention Provide education and demonstration on how to check pulse in carotid and radial arteries.;Review the importance of being able to check your own pulse for safety during independent exercise       Expected Outcomes Short Term: Able to explain why pulse checking is important during independent exercise;Long Term: Able to check pulse independently and accurately       Understanding of Exercise Prescription Yes       Intervention Provide education, explanation, and written materials on patient's individual exercise prescription       Expected Outcomes Short Term: Able to explain program exercise prescription;Long Term: Able to explain home exercise prescription to exercise independently          Exercise Goals Re-Evaluation :  Exercise Goals Re-Evaluation     Row Name 06/17/24 0840 07/04/24 0749 07/18/24 1640         Exercise Goal Re-Evaluation   Exercise Goals Review Able to understand and use rate of perceived exertion (RPE) scale;Knowledge and understanding of Target Heart Rate Range (THRR);Understanding of Exercise Prescription;Increase Physical Activity;Increase Strength and Stamina;Able to understand and use Dyspnea scale;Able to check pulse independently Increase Physical  Activity;Understanding of Exercise Prescription;Increase Strength and Stamina Increase Physical Activity;Understanding of Exercise Prescription;Increase Strength and Stamina     Comments Reviewed RPE and dyspnea scale, THR and program prescription with pt today.  Pt voiced understanding and was given a copy of goals to take home. Alex Brandt is off to a good start in the program and completed his first 2 weeks in this review. He had a workload on the treadmill of a speed of 3 mph with 7% incline. He worked at level 3 on the T6 nustep, level 6 on the XR, and level 7 on the rower. We will continue to monitor his progress in the program. Alex Brandt is doing well in rehab. He had a workload on the treadmill of a maintained speed of 3 mph with an incline of 1%. He maintained level 3 on the T6 nustep. He maintained his average watts of 44 on the rower, but decreased to level 5. He was able to walk 27 laps on the track. We will continue to monitor his progress in the program.     Expected Outcomes Short: Use RPE daily to regulate intensity. Long: Follow program prescription in THR. Short: Continue to follow current exercise prescription. Long: Continue exercise to improve strength and stamina. Short: Continue to progressively increase treadmill workload. Long: Continue exercise to improve strength and stamina.        Discharge Exercise Prescription (Final Exercise Prescription Changes):  Exercise Prescription Changes - 07/18/24 1600       Response to Exercise   Blood Pressure (Admit) 94/60    Blood Pressure (Exercise) 122/58    Blood Pressure (Exit) 92/62    Heart Rate (Admit) 72 bpm    Heart Rate (Exercise) 132 bpm    Heart Rate (Exit) 88 bpm    Rating of Perceived Exertion (Exercise) 17    Symptoms none    Duration Progress to 30 minutes of  aerobic without signs/symptoms of physical distress  Intensity THRR unchanged      Progression   Progression Continue to progress workloads to maintain intensity  without signs/symptoms of physical distress.    Average METs 4.01      Resistance Training   Training Prescription Yes    Weight 8 lb    Reps 10-15      Interval Training   Interval Training No      Treadmill   MPH 3    Grade 1    Minutes 15    METs 3.71      NuStep   Level 3   T6   Minutes 15    METs 3.5      REL-XR   Level 4    Minutes 15    METs 6      Rower   Level 5    Watts 44    Minutes 15    METs 4.98      Track   Laps 27    Minutes 15    METs 2.47      Oxygen   Maintain Oxygen Saturation 88% or higher          Nutrition:  Target Goals: Understanding of nutrition guidelines, daily intake of sodium 1500mg , cholesterol 200mg , calories 30% from fat and 7% or less from saturated fats, daily to have 5 or more servings of fruits and vegetables.  Education: Nutrition 1 -Group instruction provided by verbal, written material, interactive activities, discussions, models, and posters to present general guidelines for heart healthy nutrition including macronutrients, label reading, and promoting whole foods over processed counterparts. Education serves as Pensions consultant of discussion of heart healthy eating for all. Written material provided at class time.    Education: Nutrition 2 -Group instruction provided by verbal, written material, interactive activities, discussions, models, and posters to present general guidelines for heart healthy nutrition including sodium, cholesterol, and saturated fat. Providing guidance of habit forming to improve blood pressure, cholesterol, and body weight. Written material provided at class time.     Biometrics:  Pre Biometrics - 06/12/24 0914       Pre Biometrics   Height 6' 3.8 (1.925 m)    Weight 230 lb 3.2 oz (104.4 kg)    Waist Circumference 42 inches    Hip Circumference 41 inches    Waist to Hip Ratio 1.02 %    BMI (Calculated) 28.18    Single Leg Stand 30 seconds           Nutrition Therapy Plan and  Nutrition Goals:  Nutrition Therapy & Goals - 06/12/24 0901       Intervention Plan   Intervention Prescribe, educate and counsel regarding individualized specific dietary modifications aiming towards targeted core components such as weight, hypertension, lipid management, diabetes, heart failure and other comorbidities.    Expected Outcomes Short Term Goal: Understand basic principles of dietary content, such as calories, fat, sodium, cholesterol and nutrients.;Short Term Goal: A plan has been developed with personal nutrition goals set during dietitian appointment.;Long Term Goal: Adherence to prescribed nutrition plan.          Nutrition Assessments:  MEDIFICTS Score Key: >=70 Need to make dietary changes  40-70 Heart Healthy Diet <= 40 Therapeutic Level Cholesterol Diet  Flowsheet Row Cardiac Rehab from 06/12/2024 in [facility] Cardiac and Pulmonary Rehab  Picture Your Plate Total Score on Admission 49   Picture Your Plate Scores: <59 Unhealthy dietary pattern with much room for improvement. 41-50 Dietary pattern unlikely to meet recommendations for  good health and room for improvement. 51-60 More healthful dietary pattern, with some room for improvement.  >60 Healthy dietary pattern, although there may be some specific behaviors that could be improved.    Nutrition Goals Re-Evaluation:   Nutrition Goals Discharge (Final Nutrition Goals Re-Evaluation):   Psychosocial: Target Goals: Acknowledge presence or absence of significant depression and/or stress, maximize coping skills, provide positive support system. Participant is able to verbalize types and ability to use techniques and skills needed for reducing stress and depression.   Education: Stress, Anxiety, and Depression - Group verbal and visual presentation to define topics covered.  Reviews how body is impacted by stress, anxiety, and depression.  Also discusses healthy ways to reduce stress and to treat/manage anxiety and  depression. Written material provided at class time. Flowsheet Row Cardiac Rehab from 06/12/2024 in [facility] Cardiac and Pulmonary Rehab  Education need identified 06/12/24    Education: Sleep Hygiene -Provides group verbal and written instruction about how sleep can affect your health.  Define sleep hygiene, discuss sleep cycles and impact of sleep habits. Review good sleep hygiene tips.   Initial Review & Psychosocial Screening:  Initial Psych Review & Screening - 06/11/24 1311       Initial Review   Current issues with Current Stress Concerns    Source of Stress Concerns Financial      Family Dynamics   Good Support System? Yes      Barriers   Psychosocial barriers to participate in program There are no identifiable barriers or psychosocial needs.      Screening Interventions   Interventions Encouraged to exercise;To provide support and resources with identified psychosocial needs;Provide feedback about the scores to participant    Expected Outcomes Short Term goal: Utilizing psychosocial counselor, staff and physician to assist with identification of specific Stressors or current issues interfering with healing process. Setting desired goal for each stressor or current issue identified.;Long Term Goal: Stressors or current issues are controlled or eliminated.;Short Term goal: Identification and review with participant of any Quality of Life or Depression concerns found by scoring the questionnaire.;Long Term goal: The participant improves quality of Life and PHQ9 Scores as seen by post scores and/or verbalization of changes          Quality of Life Scores:   Quality of Life - 06/12/24 0900       Quality of Life   Select Quality of Life      Quality of Life Scores   Health/Function Pre 29.6 %    Socioeconomic Pre 30 %    Psych/Spiritual Pre 30 %    Family Pre 30 %    GLOBAL Pre 29.83 %         Scores of 19 and below usually indicate a poorer quality of life in these  areas.  A difference of  2-3 points is a clinically meaningful difference.  A difference of 2-3 points in the total score of the Quality of Life Index has been associated with significant improvement in overall quality of life, self-image, physical symptoms, and general health in studies assessing change in quality of life.  PHQ-9: Review Flowsheet       06/12/2024 07/06/2022  Depression screen PHQ 2/9  Decreased Interest 0 1  Down, Depressed, Hopeless 0 0  PHQ - 2 Score 0 1  Altered sleeping 1 2  Tired, decreased energy 1 2  Change in appetite 1 2  Feeling bad or failure about yourself  0 0  Trouble concentrating  0 0  Moving slowly or fidgety/restless 0 0  Suicidal thoughts 0 0  PHQ-9 Score 3 7  Difficult doing work/chores Not difficult at all Somewhat difficult   Interpretation of Total Score  Total Score Depression Severity:  1-4 = Minimal depression, 5-9 = Mild depression, 10-14 = Moderate depression, 15-19 = Moderately severe depression, 20-27 = Severe depression   Psychosocial Evaluation and Intervention:  Psychosocial Evaluation - 06/11/24 1322       Psychosocial Evaluation & Interventions   Interventions Encouraged to exercise with the program and follow exercise prescription;Stress management education    Comments Alex Brandt is coming to cardiac rehab after a stent placement. He is working on quitting smoking and losing weight. He recently got a new job, but has been out of work for a month so the financial stress has been a struggle recently. He started back to work today and feels this will help once his paycheck comes in. His job is supportive of him attending the program and has already worked his schedule around coming three times a week. 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 He mentions that he does feel better eating throughout the day instead of waiting  til dinner time. He is motivated to attend the program    Expected Outcomes Short: attend cardiac rehab for education and exercise Long: develop and maintain positive self care habits    Continue Psychosocial Services  Follow up required by staff          Psychosocial Re-Evaluation:   Psychosocial Discharge (Final Psychosocial Re-Evaluation):   Vocational Rehabilitation: Provide vocational rehab assistance to qualifying candidates.   Vocational Rehab Evaluation & Intervention:  Vocational Rehab - 06/11/24 1309       Initial Vocational Rehab Evaluation & Intervention   Assessment shows need for Vocational Rehabilitation No          Education: Education Goals: Education classes will be provided on a variety of topics geared toward better understanding of heart health and risk factor modification. Participant will state understanding/return demonstration of topics presented as noted by education test scores.  Learning Barriers/Preferences:  Learning Barriers/Preferences - 06/11/24 1309       Learning Barriers/Preferences   Learning Barriers None    Learning Preferences None          General Cardiac Education Topics:  AED/CPR: - Group verbal and written instruction with the use of models to demonstrate the basic use of the AED with the basic ABC's of resuscitation.   Test and Procedures: - Group verbal and visual presentation and models provide information about basic cardiac anatomy and function. Reviews the testing methods done to diagnose heart disease and the outcomes of the test results. Describes the treatment choices: Medical Management, Angioplasty, or Coronary Bypass Surgery for treating various heart conditions including Myocardial Infarction, Angina, Valve Disease, and Cardiac Arrhythmias. Written material provided at class time. Flowsheet Row Cardiac Rehab from 06/12/2024 in [facility] Cardiac and Pulmonary Rehab  Education need identified 06/12/24    Medication  Safety: - Group verbal and visual instruction to review commonly prescribed medications for heart and lung disease. Reviews the medication, class of the drug, and side effects. Includes the steps to properly store meds and maintain the prescription regimen. Written material provided at class time. Flowsheet Row Cardiac Rehab from 07/13/2022 in [facility] Cardiac and Pulmonary Rehab  Date 07/13/22  Educator SB  Instruction Review Code 1- Verbalizes Understanding    Intimacy: - Group verbal instruction through game format to discuss how heart and lung disease can affect sexual intimacy. Written material provided at class time.   Know Your Numbers and Heart Failure: - Group verbal and visual instruction to discuss disease risk factors for cardiac and pulmonary disease and treatment options.  Reviews associated critical values for Overweight/Obesity, Hypertension, Cholesterol, and Diabetes.  Discusses basics of heart failure: signs/symptoms and treatments.  Introduces Heart Failure Zone chart for action plan for heart failure. Written material provided at class time. Flowsheet Row Cardiac Rehab from 07/13/2022 in [facility] Cardiac and Pulmonary Rehab  Education need identified 07/06/22    Infection Prevention: - Provides verbal and written material to individual with discussion of infection control including proper hand washing and proper equipment cleaning during exercise session. Flowsheet Row Cardiac Rehab from 06/12/2024 in [facility] Cardiac and Pulmonary Rehab  Date 06/12/24  Educator NT  Instruction Review Code 1- Verbalizes Understanding    Falls Prevention: - Provides verbal and written material to individual with discussion of falls prevention and safety. Flowsheet Row Cardiac Rehab from 06/12/2024 in [facility] Cardiac and Pulmonary Rehab  Date 06/12/24  Educator NT  Instruction Review Code 1- Verbalizes Understanding    Other: -Provides group and verbal instruction on various topics (see  comments)   Knowledge Questionnaire Score:  Knowledge Questionnaire Score - 06/12/24 0859       Knowledge Questionnaire Score   Pre Score 20/26          Core Components/Risk Factors/Patient Goals at Admission:  Personal Goals and Risk Factors at Admission - 06/11/24 1306       Core Components/Risk Factors/Patient Goals on Admission    Weight Management Yes;Weight Loss    Intervention Weight Management: Develop a combined nutrition and exercise program designed to reach desired caloric intake, while maintaining appropriate intake of nutrient and fiber, sodium and fats, and appropriate energy expenditure required for the weight goal.;Weight Management: Provide education and appropriate resources to help participant work on and attain dietary goals.;Weight Management/Obesity: Establish reasonable short term and long term weight goals.    Goal Weight: Long Term 185 lb (83.9 kg)    Expected Outcomes Short Term: Continue to assess and modify interventions until short term weight is achieved;Long Term: Adherence to nutrition and physical activity/exercise program aimed toward attainment of established weight goal;Weight Loss: Understanding of general recommendations for a balanced deficit meal plan, which promotes 1-2 lb weight loss per week and includes a negative energy balance of [phone] kcal/d;Understanding of distribution of calorie intake throughout the day with the consumption of 4-5 meals/snacks;Understanding recommendations for meals to include 15-35% energy as protein, 25-35% energy from fat, 35-60% energy from carbohydrates, less than 200mg  of dietary cholesterol, 20-35 gm of total fiber daily    Tobacco Cessation Yes    Number of packs per day 0.5    Intervention Assist the participant in steps to quit. Provide individualized education and counseling about committing to Tobacco Cessation, relapse prevention, and pharmacological support that can be provided by physician.;Engineer, structural, assist with locating and accessing local/national Quit Smoking programs, and support quit date choice.    Expected Outcomes Short Term: Will demonstrate readiness to quit, by selecting a quit date.;Short Term: Will quit all tobacco product use, adhering to prevention of relapse plan.;Long Term: Complete abstinence from all tobacco products for at least 12 months from quit date.    Hypertension Yes    Intervention Provide  education on lifestyle modifcations including regular physical activity/exercise, weight management, moderate sodium restriction and increased consumption of fresh fruit, vegetables, and low fat dairy, alcohol moderation, and smoking cessation.;Monitor prescription use compliance.    Expected Outcomes Short Term: Continued assessment and intervention until BP is < 140/61mm HG in hypertensive participants. < 130/71mm HG in hypertensive participants with diabetes, heart failure or chronic kidney disease.;Long Term: Maintenance of blood pressure at goal levels.    Lipids Yes    Intervention Provide education and support for participant on nutrition & aerobic/resistive exercise along with prescribed medications to achieve LDL 70mg , HDL >40mg .    Expected Outcomes Short Term: Participant states understanding of desired cholesterol values and is compliant with medications prescribed. Participant is following exercise prescription and nutrition guidelines.;Long Term: Cholesterol controlled with medications as prescribed, with individualized exercise RX and with personalized nutrition plan. Value goals: LDL < 70mg , HDL > 40 mg.          Education:Diabetes - Individual verbal and written instruction to review signs/symptoms of diabetes, desired ranges of glucose level fasting, after meals and with exercise. Acknowledge that pre and post exercise glucose checks will be done for 3 sessions at entry of program.   Core Components/Risk Factors/Patient Goals Review:     Core Components/Risk Factors/Patient Goals at Discharge (Final Review):    ITP Comments:  ITP Comments     Row Name 06/11/24 1333 06/12/24 0855 06/17/24 0840 07/03/24 1118 07/31/24 1055   ITP Comments Initial phone call completed. Diagnosis can be found in [facility] 05/18/24. EP Orientation scheduled for Wednesday 9/3 at 8am Completed and gym orientation for cardiac rehab. Initial ITP created and sent for review to Dr. Oneil Pinal, Medical Director. First full day of exercise!  Patient was oriented to gym and equipment including functions, settings, policies, and procedures.  Patient's individual exercise prescription and treatment plan were reviewed.  All starting workloads were established based on the results of the 6 minute walk test done at initial orientation visit.  The plan for exercise progression was also introduced and progression will be customized based on patient's performance and goals. 30 Day review completed. Medical Director ITP review done; changes made as directed and signed approval by Medical Director. New to program. 30 Day review completed. Medical Director ITP review done, changes made as directed, and signed approval by Medical Director.      Comments: 30 day review

## 2024-08-02 ENCOUNTER — Encounter

## 2024-08-05 ENCOUNTER — Encounter

## 2024-08-07 ENCOUNTER — Encounter

## 2024-08-07 DIAGNOSIS — I213 ST elevation (STEMI) myocardial infarction of unspecified site: Secondary | ICD-10-CM

## 2024-08-07 NOTE — Progress Notes (Signed)
 Early Discharge Summary  NADIA TORR Aug 10, 1976  Zadin is discharging early due to lack of attendance. He completed 10 of 36 sessions.    6 Minute Walk     Row Name 06/12/24 0912         6 Minute Walk   Phase Initial     Distance 1580 feet     Walk Time 6 minutes     # of Rest Breaks 0     MPH 2.99     METS 4.85     RPE 13     Perceived Dyspnea  1     VO2 Peak 16.97     Symptoms Yes (comment)     Comments Right knee pain 4/10     Resting HR 71 bpm     Resting BP 112/68     Resting Oxygen Saturation  99 %     Exercise Oxygen Saturation  during 6 min walk 99 %     Max Ex. HR 112 bpm     Max Ex. BP 126/68     2 Minute Post BP 108/66

## 2024-08-07 NOTE — Progress Notes (Signed)
 Cardiac Individual Treatment Plan  Patient Details  Name: Alex Brandt MRN: [id number] Date of Birth: Feb 01, 1976 Referring Provider:   Flowsheet Row Cardiac Rehab from 06/12/2024 in [facility] Cardiac and Pulmonary Rehab  Referring Provider Dr. Zachary Car, MD    Initial Encounter Date:  Flowsheet Row Cardiac Rehab from 06/12/2024 in [facility] Cardiac and Pulmonary Rehab  Date 06/12/24    Visit Diagnosis: ST elevation myocardial infarction (STEMI), unspecified artery (HCC)  Patient's Home Medications on Admission:  Current Outpatient Medications:    acetaminophen  (TYLENOL ) 325 MG tablet, Take 2 tablets (650 mg total) by mouth every 6 (six) hours as needed for mild pain, fever or headache (or Fever >/= 101)., Disp: 20 tablet, Rfl: 1   aspirin  EC 81 MG tablet, Take 81 mg by mouth daily., Disp: , Rfl:    clopidogrel  (PLAVIX ) 75 MG tablet, Take 75 mg by mouth daily., Disp: , Rfl:    ezetimibe (ZETIA) 10 MG tablet, Take 10 mg by mouth daily., Disp: , Rfl:    metoprolol  succinate (TOPROL -XL) 50 MG 24 hr tablet, Take 50 mg by mouth daily., Disp: , Rfl:    nitroGLYCERIN  (NITROSTAT ) 0.4 MG SL tablet, Place 0.4 mg under the tongue every 5 (five) minutes as needed for chest pain., Disp: , Rfl:    omeprazole (PRILOSEC) 40 MG capsule, Take 40 mg by mouth daily., Disp: , Rfl:    ranolazine  (RANEXA ) 500 MG 12 hr tablet, Take 1 tablet (500 mg total) by mouth 2 (two) times daily., Disp: 60 tablet, Rfl: 0   REPATHA SURECLICK 140 MG/ML SOAJ, Inject 140 mg into the skin every 14 (fourteen) days., Disp: , Rfl:    rosuvastatin  (CRESTOR ) 40 MG tablet, Take 40 mg by mouth at bedtime., Disp: , Rfl:   Past Medical History: Past Medical History:  Diagnosis Date   Angina at rest    Arthritis    Coronary artery disease 05/05/2021   a.) LHC 05/05/2021: EF 55-65%; normal LV function and LVEDP; 50% mLAD, 50% D1, 50% p-mRCA; med mgmt.   DJD (degenerative joint disease) of knee    Dyspnea    GERD (gastroesophageal  reflux disease)    HLD (hyperlipidemia)    Hyperkalemia    Hypertension    Migraines    Myocardial infarction [facility])    Valvular insufficiency 12/18/2014   a.) TTE 12/18/2014: EF >55%; mild PR, moderate MR/TR.    Tobacco Use: Social History   Tobacco Use  Smoking Status Every Day   Current packs/day: 0.50   Average packs/day: 0.5 packs/day for 20.0 years (10.0 ttl pk-yrs)   Types: Cigarettes  Smokeless Tobacco Former    Labs: Investment Banker, Operational       Latest Ref Rng & Units 06/02/2022 03/12/2023  Labs for ITP Cardiac and Pulmonary Rehab  Cholestrol 0 - 200 mg/dL 821  877   LDL (calc) 0 - 99 mg/dL 875  75   HDL-C >59 mg/dL 45  35   Trlycerides <849 mg/dL 47  62   Hemoglobin J8r 4.8 - 5.6 % 5.3  5.7      Exercise Target Goals: Exercise Program Goal: Individual exercise prescription set using results from initial 6 min walk test and THRR while considering  patient's activity barriers and safety.   Exercise Prescription Goal: Initial exercise prescription builds to 30-45 minutes a day of aerobic activity, 2-3 days per week.  Home exercise guidelines will be given to patient during program as part of exercise prescription that the participant will acknowledge.  Education: Aerobic Exercise: - Group verbal and visual presentation on the components of exercise prescription. Introduces F.I.T.T principle from ACSM for exercise prescriptions.  Reviews F.I.T.T. principles of aerobic exercise including progression. Written material provided at class time. Flowsheet Row Cardiac Rehab from 06/12/2024 in [facility] Cardiac and Pulmonary Rehab  Education need identified 06/12/24    Education: Resistance Exercise: - Group verbal and visual presentation on the components of exercise prescription. Introduces F.I.T.T principle from ACSM for exercise prescriptions  Reviews F.I.T.T. principles of resistance exercise including progression. Written material provided at class time. Flowsheet Row Cardiac  Rehab from 07/13/2022 in [facility] Cardiac and Pulmonary Rehab  Education need identified 07/06/22     Education: Exercise & Equipment Safety: - Individual verbal instruction and demonstration of equipment use and safety with use of the equipment. Flowsheet Row Cardiac Rehab from 06/12/2024 in [facility] Cardiac and Pulmonary Rehab  Date 06/12/24  Educator NT  Instruction Review Code 1- Verbalizes Understanding    Education: Exercise Physiology & General Exercise Guidelines: - Group verbal and written instruction with models to review the exercise physiology of the cardiovascular system and associated critical values. Provides general exercise guidelines with specific guidelines to those with heart or lung disease. Written material provided at class time.   Education: Flexibility, Balance, Mind/Body Relaxation: - Group verbal and visual presentation with interactive activity on the components of exercise prescription. Introduces F.I.T.T principle from ACSM for exercise prescriptions. Reviews F.I.T.T. principles of flexibility and balance exercise training including progression. Also discusses the mind body connection.  Reviews various relaxation techniques to help reduce and manage stress (i.e. Deep breathing, progressive muscle relaxation, and visualization). Balance handout provided to take home. Written material provided at class time.   Activity Barriers & Risk Stratification:  Activity Barriers & Cardiac Risk Stratification - 06/11/24 1305       Activity Barriers & Cardiac Risk Stratification   Activity Barriers Right Knee Replacement;Joint Problems    Cardiac Risk Stratification High          6 Minute Walk:  6 Minute Walk     Row Name 06/12/24 0912         6 Minute Walk   Phase Initial     Distance 1580 feet     Walk Time 6 minutes     # of Rest Breaks 0     MPH 2.99     METS 4.85     RPE 13     Perceived Dyspnea  1     VO2 Peak 16.97     Symptoms Yes (comment)     Comments  Right knee pain 4/10     Resting HR 71 bpm     Resting BP 112/68     Resting Oxygen Saturation  99 %     Exercise Oxygen Saturation  during 6 min walk 99 %     Max Ex. HR 112 bpm     Max Ex. BP 126/68     2 Minute Post BP 108/66        Oxygen Initial Assessment:   Oxygen Re-Evaluation:   Oxygen Discharge (Final Oxygen Re-Evaluation):   Initial Exercise Prescription:  Initial Exercise Prescription - 06/12/24 0900       Date of Initial Exercise RX and Referring Provider   Date 06/12/24    Referring Provider Dr. Zachary Car, MD      Oxygen   Maintain Oxygen Saturation 88% or higher      Treadmill   MPH 3  Grade 1    Minutes 15    METs 3.71      NuStep   Level 4   T6   SPM 80    Minutes 15    METs 4.85      REL-XR   Level 4    Speed 50    Minutes 15    METs 4.85      Rower   Level 8    Watts 50    Minutes 15    METs 4.85      Prescription Details   Frequency (times per week) 3    Duration Progress to 30 minutes of continuous aerobic without signs/symptoms of physical distress      Intensity   THRR 40-80% of Max Heartrate 112-153    Ratings of Perceived Exertion 11-13    Perceived Dyspnea 0-4      Progression   Progression Continue to progress workloads to maintain intensity without signs/symptoms of physical distress.      Resistance Training   Training Prescription Yes    Weight 8 lb    Reps 10-15          Perform Capillary Blood Glucose checks as needed.  Exercise Prescription Changes:   Exercise Prescription Changes     Row Name 06/12/24 0900 07/04/24 0700 07/18/24 1600         Response to Exercise   Blood Pressure (Admit) 112/68 110/62 94/60     Blood Pressure (Exercise) 126/68 132/60 122/58     Blood Pressure (Exit) 108/66 92/52 92/62      Heart Rate (Admit) 71 bpm 83 bpm 72 bpm     Heart Rate (Exercise) 112 bpm 134 bpm 132 bpm     Heart Rate (Exit) 79 bpm 79 bpm 88 bpm     Oxygen Saturation (Admit) 99 % -- --      Oxygen Saturation (Exercise) 99 % -- --     Rating of Perceived Exertion (Exercise) 13 13 17      Perceived Dyspnea (Exercise) 1 -- --     Symptoms R knee pain 4/10 none none     Comments Results 1st 2 weeks of exercise sessions --     Duration -- Progress to 30 minutes of  aerobic without signs/symptoms of physical distress Progress to 30 minutes of  aerobic without signs/symptoms of physical distress     Intensity -- THRR unchanged THRR unchanged       Progression   Progression -- Continue to progress workloads to maintain intensity without signs/symptoms of physical distress. Continue to progress workloads to maintain intensity without signs/symptoms of physical distress.     Average METs -- 4.38 4.01       Resistance Training   Training Prescription -- Yes Yes     Weight -- 8 lb 8 lb     Reps -- 10-15 10-15       Interval Training   Interval Training -- No No       Treadmill   MPH -- 3 3     Grade -- 7 1     Minutes -- 15 15     METs -- 6.19 3.71       NuStep   Level -- 3  T6 3  T6     Minutes -- 15 15     METs -- 4.5 3.5       REL-XR   Level -- 6 4     Minutes -- 15 15  METs -- 4.9 6       Rower   Level -- 7 5     Watts -- 44 44     Minutes -- 15 15     METs -- 4.96 4.98       Track   Laps -- -- 27     Minutes -- -- 15     METs -- -- 2.47       Oxygen   Maintain Oxygen Saturation -- 88% or higher 88% or higher        Exercise Comments:   Exercise Comments     Row Name 06/17/24 0840           Exercise Comments First full day of exercise!  Patient was oriented to gym and equipment including functions, settings, policies, and procedures.  Patient's individual exercise prescription and treatment plan were reviewed.  All starting workloads were established based on the results of the 6 minute walk test done at initial orientation visit.  The plan for exercise progression was also introduced and progression will be customized based on patient's  performance and goals.          Exercise Goals and Review:   Exercise Goals     Row Name 06/12/24 0913             Exercise Goals   Increase Physical Activity Yes       Intervention Provide advice, education, support and counseling about physical activity/exercise needs.;Develop an individualized exercise prescription for aerobic and resistive training based on initial evaluation findings, risk stratification, comorbidities and participant's personal goals.       Expected Outcomes Short Term: Attend rehab on a regular basis to increase amount of physical activity.;Long Term: Add in home exercise to make exercise part of routine and to increase amount of physical activity.;Long Term: Exercising regularly at least 3-5 days a week.       Increase Strength and Stamina Yes       Intervention Provide advice, education, support and counseling about physical activity/exercise needs.;Develop an individualized exercise prescription for aerobic and resistive training based on initial evaluation findings, risk stratification, comorbidities and participant's personal goals.       Expected Outcomes Short Term: Increase workloads from initial exercise prescription for resistance, speed, and METs.;Short Term: Perform resistance training exercises routinely during rehab and add in resistance training at home;Long Term: Improve cardiorespiratory fitness, muscular endurance and strength as measured by increased METs and functional capacity ( )       Able to understand and use rate of perceived exertion (RPE) scale Yes       Intervention Provide education and explanation on how to use RPE scale       Expected Outcomes Short Term: Able to use RPE daily in rehab to express subjective intensity level;Long Term:  Able to use RPE to guide intensity level when exercising independently       Able to understand and use Dyspnea scale Yes       Intervention Provide education and explanation on how to use Dyspnea scale        Expected Outcomes Short Term: Able to use Dyspnea scale daily in rehab to express subjective sense of shortness of breath during exertion;Long Term: Able to use Dyspnea scale to guide intensity level when exercising independently       Knowledge and understanding of Target Heart Rate Range (THRR) Yes       Intervention Provide education and explanation of THRR including  how the numbers were predicted and where they are located for reference       Expected Outcomes Short Term: Able to state/look up THRR;Long Term: Able to use THRR to govern intensity when exercising independently;Short Term: Able to use daily as guideline for intensity in rehab       Able to check pulse independently Yes       Intervention Provide education and demonstration on how to check pulse in carotid and radial arteries.;Review the importance of being able to check your own pulse for safety during independent exercise       Expected Outcomes Short Term: Able to explain why pulse checking is important during independent exercise;Long Term: Able to check pulse independently and accurately       Understanding of Exercise Prescription Yes       Intervention Provide education, explanation, and written materials on patient's individual exercise prescription       Expected Outcomes Short Term: Able to explain program exercise prescription;Long Term: Able to explain home exercise prescription to exercise independently          Exercise Goals Re-Evaluation :  Exercise Goals Re-Evaluation     Row Name 06/17/24 0840 07/04/24 0749 07/18/24 1640 08/02/24 1000       Exercise Goal Re-Evaluation   Exercise Goals Review Able to understand and use rate of perceived exertion (RPE) scale;Knowledge and understanding of Target Heart Rate Range (THRR);Understanding of Exercise Prescription;Increase Physical Activity;Increase Strength and Stamina;Able to understand and use Dyspnea scale;Able to check pulse independently Increase Physical  Activity;Understanding of Exercise Prescription;Increase Strength and Stamina Increase Physical Activity;Understanding of Exercise Prescription;Increase Strength and Stamina Increase Physical Activity;Understanding of Exercise Prescription;Increase Strength and Stamina    Comments Reviewed RPE and dyspnea scale, THR and program prescription with pt today.  Pt voiced understanding and was given a copy of goals to take home. Jahan is off to a good start in the program and completed his first 2 weeks in this review. He had a workload on the treadmill of a speed of 3 mph with 7% incline. He worked at level 3 on the T6 nustep, level 6 on the XR, and level 7 on the rower. We will continue to monitor his progress in the program. Rustin is doing well in rehab. He had a workload on the treadmill of a maintained speed of 3 mph with an incline of 1%. He maintained level 3 on the T6 nustep. He maintained his average watts of 44 on the rower, but decreased to level 5. He was able to walk 27 laps on the track. We will continue to monitor his progress in the program. Hanad has not attended rehab since 07/08/2024. We have attempted to call him and left a message requesting that he call us  back to update us  on his status. We will continue to monitor his progress when he returns to the program.    Expected Outcomes Short: Use RPE daily to regulate intensity. Long: Follow program prescription in THR. Short: Continue to follow current exercise prescription. Long: Continue exercise to improve strength and stamina. Short: Continue to progressively increase treadmill workload. Long: Continue exercise to improve strength and stamina. Short: Return to rehab. Long: Continue exercise to improve strength and stamina.       Discharge Exercise Prescription (Final Exercise Prescription Changes):  Exercise Prescription Changes - 07/18/24 1600       Response to Exercise   Blood Pressure (Admit) 94/60    Blood Pressure (Exercise)  122/58  Blood Pressure (Exit) 92/62    Heart Rate (Admit) 72 bpm    Heart Rate (Exercise) 132 bpm    Heart Rate (Exit) 88 bpm    Rating of Perceived Exertion (Exercise) 17    Symptoms none    Duration Progress to 30 minutes of  aerobic without signs/symptoms of physical distress    Intensity THRR unchanged      Progression   Progression Continue to progress workloads to maintain intensity without signs/symptoms of physical distress.    Average METs 4.01      Resistance Training   Training Prescription Yes    Weight 8 lb    Reps 10-15      Interval Training   Interval Training No      Treadmill   MPH 3    Grade 1    Minutes 15    METs 3.71      NuStep   Level 3   T6   Minutes 15    METs 3.5      REL-XR   Level 4    Minutes 15    METs 6      Rower   Level 5    Watts 44    Minutes 15    METs 4.98      Track   Laps 27    Minutes 15    METs 2.47      Oxygen   Maintain Oxygen Saturation 88% or higher          Nutrition:  Target Goals: Understanding of nutrition guidelines, daily intake of sodium 1500mg , cholesterol 200mg , calories 30% from fat and 7% or less from saturated fats, daily to have 5 or more servings of fruits and vegetables.  Education: Nutrition 1 -Group instruction provided by verbal, written material, interactive activities, discussions, models, and posters to present general guidelines for heart healthy nutrition including macronutrients, label reading, and promoting whole foods over processed counterparts. Education serves as pensions consultant of discussion of heart healthy eating for all. Written material provided at class time.    Education: Nutrition 2 -Group instruction provided by verbal, written material, interactive activities, discussions, models, and posters to present general guidelines for heart healthy nutrition including sodium, cholesterol, and saturated fat. Providing guidance of habit forming to improve blood pressure,  cholesterol, and body weight. Written material provided at class time.     Biometrics:  Pre Biometrics - 06/12/24 0914       Pre Biometrics   Height 6' 3.8 (1.925 m)    Weight 230 lb 3.2 oz (104.4 kg)    Waist Circumference 42 inches    Hip Circumference 41 inches    Waist to Hip Ratio 1.02 %    BMI (Calculated) 28.18    Single Leg Stand 30 seconds           Nutrition Therapy Plan and Nutrition Goals:  Nutrition Therapy & Goals - 06/12/24 0901       Intervention Plan   Intervention Prescribe, educate and counsel regarding individualized specific dietary modifications aiming towards targeted core components such as weight, hypertension, lipid management, diabetes, heart failure and other comorbidities.    Expected Outcomes Short Term Goal: Understand basic principles of dietary content, such as calories, fat, sodium, cholesterol and nutrients.;Short Term Goal: A plan has been developed with personal nutrition goals set during dietitian appointment.;Long Term Goal: Adherence to prescribed nutrition plan.          Nutrition Assessments:  MEDIFICTS Score Key: >=70 Need  to make dietary changes  40-70 Heart Healthy Diet <= 40 Therapeutic Level Cholesterol Diet  Flowsheet Row Cardiac Rehab from 06/12/2024 in [facility] Cardiac and Pulmonary Rehab  Picture Your Plate Total Score on Admission 49   Picture Your Plate Scores: <59 Unhealthy dietary pattern with much room for improvement. 41-50 Dietary pattern unlikely to meet recommendations for good health and room for improvement. 51-60 More healthful dietary pattern, with some room for improvement.  >60 Healthy dietary pattern, although there may be some specific behaviors that could be improved.    Nutrition Goals Re-Evaluation:   Nutrition Goals Discharge (Final Nutrition Goals Re-Evaluation):   Psychosocial: Target Goals: Acknowledge presence or absence of significant depression and/or stress, maximize coping skills,  provide positive support system. Participant is able to verbalize types and ability to use techniques and skills needed for reducing stress and depression.   Education: Stress, Anxiety, and Depression - Group verbal and visual presentation to define topics covered.  Reviews how body is impacted by stress, anxiety, and depression.  Also discusses healthy ways to reduce stress and to treat/manage anxiety and depression. Written material provided at class time. Flowsheet Row Cardiac Rehab from 06/12/2024 in [facility] Cardiac and Pulmonary Rehab  Education need identified 06/12/24    Education: Sleep Hygiene -Provides group verbal and written instruction about how sleep can affect your health.  Define sleep hygiene, discuss sleep cycles and impact of sleep habits. Review good sleep hygiene tips.   Initial Review & Psychosocial Screening:  Initial Psych Review & Screening - 06/11/24 1311       Initial Review   Current issues with Current Stress Concerns    Source of Stress Concerns Financial      Family Dynamics   Good Support System? Yes      Barriers   Psychosocial barriers to participate in program There are no identifiable barriers or psychosocial needs.      Screening Interventions   Interventions Encouraged to exercise;To provide support and resources with identified psychosocial needs;Provide feedback about the scores to participant    Expected Outcomes Short Term goal: Utilizing psychosocial counselor, staff and physician to assist with identification of specific Stressors or current issues interfering with healing process. Setting desired goal for each stressor or current issue identified.;Long Term Goal: Stressors or current issues are controlled or eliminated.;Short Term goal: Identification and review with participant of any Quality of Life or Depression concerns found by scoring the questionnaire.;Long Term goal: The participant improves quality of Life and PHQ9 Scores as seen by post  scores and/or verbalization of changes          Quality of Life Scores:   Quality of Life - 06/12/24 0900       Quality of Life   Select Quality of Life      Quality of Life Scores   Health/Function Pre 29.6 %    Socioeconomic Pre 30 %    Psych/Spiritual Pre 30 %    Family Pre 30 %    GLOBAL Pre 29.83 %         Scores of 19 and below usually indicate a poorer quality of life in these areas.  A difference of  2-3 points is a clinically meaningful difference.  A difference of 2-3 points in the total score of the Quality of Life Index has been associated with significant improvement in overall quality of life, self-image, physical symptoms, and general health in studies assessing change in quality of life.  PHQ-9: Review Flowsheet  06/12/2024 07/06/2022  Depression screen PHQ 2/9  Decreased Interest 0 1  Down, Depressed, Hopeless 0 0  PHQ - 2 Score 0 1  Altered sleeping 1 2  Tired, decreased energy 1 2  Change in appetite 1 2  Feeling bad or failure about yourself  0 0  Trouble concentrating 0 0  Moving slowly or fidgety/restless 0 0  Suicidal thoughts 0 0  PHQ-9 Score 3 7  Difficult doing work/chores Not difficult at all Somewhat difficult   Interpretation of Total Score  Total Score Depression Severity:  1-4 = Minimal depression, 5-9 = Mild depression, 10-14 = Moderate depression, 15-19 = Moderately severe depression, 20-27 = Severe depression   Psychosocial Evaluation and Intervention:  Psychosocial Evaluation - 06/11/24 1322       Psychosocial Evaluation & Interventions   Interventions Encouraged to exercise with the program and follow exercise prescription;Stress management education    Comments Mr. Mcfadyen is coming to cardiac rehab after a stent placement. He is working on quitting smoking and losing weight. He recently got a new job, but has been out of work for a month so the financial stress has been a struggle recently. He started back to work today  and feels this will help once his paycheck comes in. His job is supportive of him attending the program and has already worked his schedule around coming three times a week. 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 He mentions that he does feel better eating throughout the day instead of waiting til dinner time. He is motivated to attend the program    Expected Outcomes Short: attend cardiac rehab for education and exercise Long: develop and maintain positive self care habits    Continue Psychosocial Services  Follow up required by staff          Psychosocial Re-Evaluation:   Psychosocial Discharge (Final Psychosocial Re-Evaluation):   Vocational Rehabilitation: Provide vocational rehab assistance to qualifying candidates.   Vocational Rehab Evaluation & Intervention:  Vocational Rehab - 06/11/24 1309       Initial Vocational Rehab Evaluation & Intervention   Assessment shows need for Vocational Rehabilitation No          Education: Education Goals: Education classes will be provided on a variety of topics geared toward better understanding of heart health and risk factor modification. Participant will state understanding/return demonstration of topics presented as noted by education test scores.  Learning Barriers/Preferences:  Learning Barriers/Preferences - 06/11/24 1309       Learning Barriers/Preferences   Learning Barriers None    Learning Preferences None          General Cardiac Education Topics:  AED/CPR: - Group verbal and written instruction with the use of models to demonstrate the basic use of the AED with the basic ABC's of resuscitation.   Test and Procedures: - Group verbal and visual presentation and models provide information about basic cardiac anatomy and function. Reviews the testing methods done to diagnose heart disease and the  outcomes of the test results. Describes the treatment choices: Medical Management, Angioplasty, or Coronary Bypass Surgery for treating various heart conditions including Myocardial Infarction, Angina, Valve Disease, and Cardiac Arrhythmias. Written material provided at class time. Flowsheet Row Cardiac Rehab from 06/12/2024 in [facility] Cardiac and Pulmonary Rehab  Education need identified 06/12/24    Medication Safety: - Group verbal and visual instruction to  review commonly prescribed medications for heart and lung disease. Reviews the medication, class of the drug, and side effects. Includes the steps to properly store meds and maintain the prescription regimen. Written material provided at class time. Flowsheet Row Cardiac Rehab from 07/13/2022 in [facility] Cardiac and Pulmonary Rehab  Date 07/13/22  Educator SB  Instruction Review Code 1- Verbalizes Understanding    Intimacy: - Group verbal instruction through game format to discuss how heart and lung disease can affect sexual intimacy. Written material provided at class time.   Know Your Numbers and Heart Failure: - Group verbal and visual instruction to discuss disease risk factors for cardiac and pulmonary disease and treatment options.  Reviews associated critical values for Overweight/Obesity, Hypertension, Cholesterol, and Diabetes.  Discusses basics of heart failure: signs/symptoms and treatments.  Introduces Heart Failure Zone chart for action plan for heart failure. Written material provided at class time. Flowsheet Row Cardiac Rehab from 07/13/2022 in [facility] Cardiac and Pulmonary Rehab  Education need identified 07/06/22    Infection Prevention: - Provides verbal and written material to individual with discussion of infection control including proper hand washing and proper equipment cleaning during exercise session. Flowsheet Row Cardiac Rehab from 06/12/2024 in [facility] Cardiac and Pulmonary Rehab  Date 06/12/24  Educator NT  Instruction  Review Code 1- Verbalizes Understanding    Falls Prevention: - Provides verbal and written material to individual with discussion of falls prevention and safety. Flowsheet Row Cardiac Rehab from 06/12/2024 in [facility] Cardiac and Pulmonary Rehab  Date 06/12/24  Educator NT  Instruction Review Code 1- Verbalizes Understanding    Other: -Provides group and verbal instruction on various topics (see comments)   Knowledge Questionnaire Score:  Knowledge Questionnaire Score - 06/12/24 0859       Knowledge Questionnaire Score   Pre Score 20/26          Core Components/Risk Factors/Patient Goals at Admission:  Personal Goals and Risk Factors at Admission - 06/11/24 1306       Core Components/Risk Factors/Patient Goals on Admission    Weight Management Yes;Weight Loss    Intervention Weight Management: Develop a combined nutrition and exercise program designed to reach desired caloric intake, while maintaining appropriate intake of nutrient and fiber, sodium and fats, and appropriate energy expenditure required for the weight goal.;Weight Management: Provide education and appropriate resources to help participant work on and attain dietary goals.;Weight Management/Obesity: Establish reasonable short term and long term weight goals.    Goal Weight: Long Term 185 lb (83.9 kg)    Expected Outcomes Short Term: Continue to assess and modify interventions until short term weight is achieved;Long Term: Adherence to nutrition and physical activity/exercise program aimed toward attainment of established weight goal;Weight Loss: Understanding of general recommendations for a balanced deficit meal plan, which promotes 1-2 lb weight loss per week and includes a negative energy balance of [phone] kcal/d;Understanding of distribution of calorie intake throughout the day with the consumption of 4-5 meals/snacks;Understanding recommendations for meals to include 15-35% energy as protein, 25-35% energy from fat,  35-60% energy from carbohydrates, less than 200mg  of dietary cholesterol, 20-35 gm of total fiber daily    Tobacco Cessation Yes    Number of packs per day 0.5    Intervention Assist the participant in steps to quit. Provide individualized education and counseling about committing to Tobacco Cessation, relapse prevention, and pharmacological support that can be provided by physician.;Education officer, environmental, assist with locating and accessing local/national Quit Smoking programs, and support quit date  choice.    Expected Outcomes Short Term: Will demonstrate readiness to quit, by selecting a quit date.;Short Term: Will quit all tobacco product use, adhering to prevention of relapse plan.;Long Term: Complete abstinence from all tobacco products for at least 12 months from quit date.    Hypertension Yes    Intervention Provide education on lifestyle modifcations including regular physical activity/exercise, weight management, moderate sodium restriction and increased consumption of fresh fruit, vegetables, and low fat dairy, alcohol moderation, and smoking cessation.;Monitor prescription use compliance.    Expected Outcomes Short Term: Continued assessment and intervention until BP is < 140/1mm HG in hypertensive participants. < 130/69mm HG in hypertensive participants with diabetes, heart failure or chronic kidney disease.;Long Term: Maintenance of blood pressure at goal levels.    Lipids Yes    Intervention Provide education and support for participant on nutrition & aerobic/resistive exercise along with prescribed medications to achieve LDL 70mg , HDL >40mg .    Expected Outcomes Short Term: Participant states understanding of desired cholesterol values and is compliant with medications prescribed. Participant is following exercise prescription and nutrition guidelines.;Long Term: Cholesterol controlled with medications as prescribed, with individualized exercise RX and with personalized nutrition  plan. Value goals: LDL < 70mg , HDL > 40 mg.          Education:Diabetes - Individual verbal and written instruction to review signs/symptoms of diabetes, desired ranges of glucose level fasting, after meals and with exercise. Acknowledge that pre and post exercise glucose checks will be done for 3 sessions at entry of program.   Core Components/Risk Factors/Patient Goals Review:    Core Components/Risk Factors/Patient Goals at Discharge (Final Review):    ITP Comments:  ITP Comments     Row Name 06/11/24 1333 06/12/24 0855 06/17/24 0840 07/03/24 1118 07/31/24 1055   ITP Comments Initial phone call completed. Diagnosis can be found in [facility] 05/18/24. EP Orientation scheduled for Wednesday 9/3 at 8am Completed and gym orientation for cardiac rehab. Initial ITP created and sent for review to Dr. Oneil Pinal, Medical Director. First full day of exercise!  Patient was oriented to gym and equipment including functions, settings, policies, and procedures.  Patient's individual exercise prescription and treatment plan were reviewed.  All starting workloads were established based on the results of the 6 minute walk test done at initial orientation visit.  The plan for exercise progression was also introduced and progression will be customized based on patient's performance and goals. 30 Day review completed. Medical Director ITP review done; changes made as directed and signed approval by Medical Director. New to program. 30 Day review completed. Medical Director ITP review done, changes made as directed, and signed approval by Medical Director.    Row Name 08/07/24 1333           ITP Comments Early Discharge due to lack of attendance          Comments: Early Discharge

## 2024-08-09 ENCOUNTER — Encounter

## 2024-08-12 ENCOUNTER — Encounter

## 2024-08-14 ENCOUNTER — Encounter

## 2024-08-14 ENCOUNTER — Other Ambulatory Visit: Payer: Self-pay | Admitting: Medical Genetics

## 2024-08-14 DIAGNOSIS — Z006 Encounter for examination for normal comparison and control in clinical research program: Secondary | ICD-10-CM

## 2024-08-16 ENCOUNTER — Encounter

## 2024-08-19 ENCOUNTER — Encounter

## 2024-08-21 ENCOUNTER — Encounter

## 2024-08-23 ENCOUNTER — Encounter

## 2024-08-26 ENCOUNTER — Encounter

## 2024-08-28 ENCOUNTER — Encounter

## 2024-08-30 ENCOUNTER — Encounter

## 2024-09-02 ENCOUNTER — Encounter

## 2024-09-04 ENCOUNTER — Encounter

## 2024-09-09 ENCOUNTER — Encounter

## 2024-09-11 ENCOUNTER — Encounter
